# Patient Record
Sex: Female | Born: 1970 | Race: White | Hispanic: No | Marital: Married | Smoking: Never smoker
Health system: Southern US, Community
[De-identification: ages and names within clinical notes are randomized; demographics above are authoritative.]

## PROBLEM LIST (undated history)

## (undated) DIAGNOSIS — R7303 Prediabetes: Secondary | ICD-10-CM

## (undated) DIAGNOSIS — G43909 Migraine, unspecified, not intractable, without status migrainosus: Secondary | ICD-10-CM

## (undated) HISTORY — PX: PARTIAL HYSTERECTOMY: SHX80

## (undated) HISTORY — PX: UMBILICAL HERNIA REPAIR: SHX196

## (undated) HISTORY — PX: BONE EXOSTOSIS EXCISION: SHX1249

## (undated) HISTORY — DX: Prediabetes: R73.03

## (undated) HISTORY — PX: TERATOMA EXCISION: SHX2491

## (undated) HISTORY — PX: TOTAL VAGINAL HYSTERECTOMY: SHX2548

## (undated) HISTORY — PX: OTHER SURGICAL HISTORY: SHX169

## (undated) HISTORY — PX: TOTAL HIP ARTHROPLASTY: SHX124

---

## 2011-11-08 LAB — LIPID PANEL
Cholesterol: 168 mg/dL (ref 0–200)
HDL: 39 mg/dL (ref 35–70)
LDL Cholesterol: 109 mg/dL
Triglycerides: 99 mg/dL (ref 40–160)

## 2011-11-08 LAB — BASIC METABOLIC PANEL
BUN: 16 mg/dL (ref 4–21)
CREATININE: 0.8 mg/dL (ref 0.5–1.1)
GLUCOSE: 90 mg/dL
Potassium: 4 mmol/L (ref 3.4–5.3)

## 2011-11-08 LAB — CBC AND DIFFERENTIAL
Hemoglobin: 12.6 g/dL (ref 12.0–16.0)
PLATELETS: 228 10*3/uL (ref 150–399)
WBC: 4.9 10^3/mL

## 2011-11-08 LAB — HEPATIC FUNCTION PANEL
ALT: 11 U/L (ref 7–35)
AST: 15 U/L (ref 13–35)

## 2011-11-10 LAB — URINE, BLOOD

## 2011-11-10 LAB — HEMOGLOBIN A1C: HEMOGLOBIN A1C: 6 % (ref 4.0–6.0)

## 2011-11-10 LAB — TSH: TSH: 1.02 u[IU]/mL (ref 0.41–5.90)

## 2012-08-02 LAB — VITAMIN D 25 HYDROXY (VIT D DEFICIENCY, FRACTURES): Vit D, 25-Hydroxy: 26.2

## 2013-04-11 ENCOUNTER — Encounter: Payer: Self-pay | Admitting: Family Medicine

## 2013-04-11 ENCOUNTER — Ambulatory Visit (INDEPENDENT_AMBULATORY_CARE_PROVIDER_SITE_OTHER): Payer: 59 | Admitting: Family Medicine

## 2013-04-11 VITALS — BP 136/92 | HR 122 | Ht 63.0 in | Wt 169.0 lb

## 2013-04-11 DIAGNOSIS — M719 Bursopathy, unspecified: Secondary | ICD-10-CM

## 2013-04-11 DIAGNOSIS — R7303 Prediabetes: Secondary | ICD-10-CM | POA: Insufficient documentation

## 2013-04-11 DIAGNOSIS — Z96649 Presence of unspecified artificial hip joint: Secondary | ICD-10-CM

## 2013-04-11 DIAGNOSIS — Z96641 Presence of right artificial hip joint: Secondary | ICD-10-CM | POA: Insufficient documentation

## 2013-04-11 DIAGNOSIS — L941 Linear scleroderma: Secondary | ICD-10-CM | POA: Insufficient documentation

## 2013-04-11 DIAGNOSIS — M7582 Other shoulder lesions, left shoulder: Secondary | ICD-10-CM | POA: Insufficient documentation

## 2013-04-11 DIAGNOSIS — Z13220 Encounter for screening for lipoid disorders: Secondary | ICD-10-CM

## 2013-04-11 DIAGNOSIS — R7309 Other abnormal glucose: Secondary | ICD-10-CM

## 2013-04-11 DIAGNOSIS — K900 Celiac disease: Secondary | ICD-10-CM

## 2013-04-11 DIAGNOSIS — M67919 Unspecified disorder of synovium and tendon, unspecified shoulder: Secondary | ICD-10-CM

## 2013-04-11 DIAGNOSIS — K9041 Non-celiac gluten sensitivity: Secondary | ICD-10-CM | POA: Insufficient documentation

## 2013-04-11 NOTE — Progress Notes (Signed)
CC: Wendy Anderson is a 43 y.o. 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  She had her rheumatologic workup that included a positive ANA however all other labs argued against any other limiting disease causing her osteoarthritis. She reports she has mild to moderate pain in most joints of her body on a daily basis however this was 75% resolved ever since she stopped eating gluten. Symptoms will worsen if she has gluten unknowingly. Symptoms are improved with occasional nonsteroidal anti-inflammatory use. She denies any swelling redness or warmth of any of her joints in the recent past. Pain is not interfering with quality of life  She reports a history of prediabetes most recent A1c 6.0 approximately 12 months ago. She reports insulin-dependent gestational diabetes in one of her pregnancies about 20 years ago.  She's never had an A1c of 6.5 or above that she knows of.  Complains of left shoulder pain that has been present for 4 months which is different in character compared to her chronic diffuse joint pain. Is described as sharp, nonradiating, localized in the anterior shoulder worse with abduction of the humerus. Improves with Tylenol no benefit with etodolac. Symptoms are mild in severity occasionally awake her sleep if she rolls over on the shoulder. She denies weakness or motor or sensory disturbances in the left upper extremity nor recent or remote trauma.  Review Of Systems Outlined In HPI  Past Medical History  Diagnosis Date  . Prediabetes      Family History  Problem Relation Age of Onset  . Lung cancer Mother     deceased at age 33  . Heart attack Maternal Grandfather   . Diabetes Paternal Grandfather   . Hyperlipidemia Paternal Grandfather   . Neurologic Disorder Father     no dx as of yet;     History   Substance Use Topics  . Smoking status: Never Smoker   . Smokeless tobacco: Not on file  . Alcohol Use: Yes     Comment: socially     Objective: Filed Vitals:   04/11/13 1052  BP: 136/92  Pulse: 122    General: Alert and Oriented, No Acute Distress HEENT: Pupils equal, round, reactive to light. Conjunctivae clear. Moist mucous membranes pharynx unremarkable Lungs: Clear portable work of breathing Cardiac: Regular rate and rhythm.  Extremities: No peripheral edema.  Strong peripheral pulses.  Left upper extremities exam shows positive Hawkins, positive empty can, negative crossarm no pain a.c. joint to palpation, negative nears full range of motion strength throughout the left upper extremity Mental Status: No depression, anxiety, nor agitation. Skin: Warm and dry.  Assessment & Plan: Elli was seen today for establish care.  Diagnoses and associated orders for this visit:  Status post right hip replacement 2012 [facility]  Prediabetes - BASIC METABOLIC PANEL WITH GFR  Lipid screening - Lipid panel  Tendinitis of left rotator cuff  Gluten intolerance    Prediabetes: Checking A1c and fasting blood sugar Gluten intolerance: Low suspicion of celiac disease however if she's getting benefit from gluten restriction from a pain standpoint I've encouraged her to keep up with this intervention. We also discussed exercise and additional dietary interventions to help with weight loss and conditioning Left rotator cuff tendinitis I've encouraged her to start with a home rehabilitation plan on a daily basis for the  next 3 weeks handout was provided return for consideration of steroid injection if not improved by late next month Due for routine dyslipidemia screening Right hip replacement: She'll continue to see [facility] on an annual basis 45 minutes spent face-to-face during visit today of which at least 50% was counseling or coordinating care regarding: 1. Status post  right hip replacement 2012 [facility]   2. Prediabetes   3. Lipid screening   4. Tendinitis of left rotator cuff   5. Gluten intolerance      Return in about 4 weeks (around 05/09/2013) for CPE.

## 2013-04-12 ENCOUNTER — Encounter: Payer: Self-pay | Admitting: Family Medicine

## 2013-04-12 DIAGNOSIS — K649 Unspecified hemorrhoids: Secondary | ICD-10-CM | POA: Insufficient documentation

## 2013-04-12 DIAGNOSIS — M5030 Other cervical disc degeneration, unspecified cervical region: Secondary | ICD-10-CM | POA: Insufficient documentation

## 2013-04-12 DIAGNOSIS — R319 Hematuria, unspecified: Secondary | ICD-10-CM | POA: Insufficient documentation

## 2013-04-12 DIAGNOSIS — E559 Vitamin D deficiency, unspecified: Secondary | ICD-10-CM | POA: Insufficient documentation

## 2013-04-12 DIAGNOSIS — G4700 Insomnia, unspecified: Secondary | ICD-10-CM | POA: Insufficient documentation

## 2013-04-13 ENCOUNTER — Encounter: Payer: Self-pay | Admitting: *Deleted

## 2013-04-15 ENCOUNTER — Encounter: Payer: Self-pay | Admitting: *Deleted

## 2013-06-14 LAB — BASIC METABOLIC PANEL WITH GFR
BUN: 14 mg/dL (ref 6–23)
CALCIUM: 9.4 mg/dL (ref 8.4–10.5)
CO2: 26 mEq/L (ref 19–32)
Chloride: 103 mEq/L (ref 96–112)
Creat: 0.74 mg/dL (ref 0.50–1.10)
GFR, Est Non African American: >89 mL/min
Glucose, Bld: 95 mg/dL (ref 70–99)
Potassium: 4.9 mEq/L (ref 3.5–5.3)
Sodium: 138 mEq/L (ref 135–145)

## 2013-06-14 LAB — LIPID PANEL
CHOL/HDL RATIO: 3.5 ratio
CHOLESTEROL: 190 mg/dL (ref 0–200)
HDL: 54 mg/dL (ref >39)
LDL Cholesterol: 113 mg/dL — ABNORMAL HIGH (ref 0–99)
TRIGLYCERIDES: 116 mg/dL (ref <150)
VLDL: 23 mg/dL (ref 0–40)

## 2013-06-14 LAB — HEMOGLOBIN A1C
Hgb A1c MFr Bld: 6 % — ABNORMAL HIGH (ref <5.7)
Mean Plasma Glucose: 126 mg/dL — ABNORMAL HIGH (ref <117)

## 2013-06-20 ENCOUNTER — Ambulatory Visit (INDEPENDENT_AMBULATORY_CARE_PROVIDER_SITE_OTHER): Payer: 59

## 2013-06-20 ENCOUNTER — Other Ambulatory Visit: Payer: Self-pay | Admitting: Family Medicine

## 2013-06-20 ENCOUNTER — Ambulatory Visit: Payer: 59

## 2013-06-20 ENCOUNTER — Encounter: Payer: Self-pay | Admitting: Family Medicine

## 2013-06-20 ENCOUNTER — Ambulatory Visit (INDEPENDENT_AMBULATORY_CARE_PROVIDER_SITE_OTHER): Payer: 59 | Admitting: Family Medicine

## 2013-06-20 ENCOUNTER — Telehealth: Payer: Self-pay | Admitting: Family Medicine

## 2013-06-20 VITALS — BP 132/84 | HR 88 | Wt 170.0 lb

## 2013-06-20 DIAGNOSIS — M79609 Pain in unspecified limb: Secondary | ICD-10-CM

## 2013-06-20 DIAGNOSIS — M79672 Pain in left foot: Secondary | ICD-10-CM

## 2013-06-20 MED ORDER — MELOXICAM 15 MG PO TABS: 15.0000 mg | ORAL_TABLET | Freq: Every day | ORAL | Status: DC

## 2013-06-20 NOTE — Progress Notes (Signed)
CC: Wendy Anderson is a 43 y.o. female is here for left foot pain   Subjective: HPI:  Right heel pain that has been present for the past 4 days came on gradually and has not been getting better or worse since onset. Seems to be worse late in the day. Described as a sharpness moderate severity nonradiating localized on the bottom of the heel. Has not been accompanied by any swelling redness or bruising. No other skin changes. Pain is improved when walking on the toes worse when walking on the heel. No improvement with etodolac no other interventions as of yet.  Works out Tuesdays and Thursdays with intervals and gym work.  Review Of Systems Outlined In HPI  Past Medical History  Diagnosis Date  . Prediabetes     Past Surgical History  Procedure Laterality Date  . Partial hysterectomy    . Bladder tack    . Umbilical hernia repair    . Teratoma excision      bilateral  . Bone exostosis excision      removal from left femur(benign)  . Total hip arthroplasty      right hip  . Removal of bone spurs      form 4th and 5th toes   Family History  Problem Relation Age of Onset  . Lung cancer Mother     deceased at age 60  . Heart attack Maternal Grandfather   . Diabetes Paternal Grandfather   . Hyperlipidemia Paternal Grandfather   . Neurologic Disorder Father     no dx as of yet;    History   Social History  . Marital Status: Married    Spouse Name: N/A    Number of Children: N/A  . Years of Education: N/A   Occupational History  . Not on file.   Social History Main Topics  . Smoking status: Never Smoker   . Smokeless tobacco: Not on file  . Alcohol Use: Yes     Comment: socially  . Drug Use: No  . Sexual Activity: Yes    Partners: Male   Other Topics Concern  . Not on file   Social History Narrative  . No narrative on file     Objective: BP 132/84  Pulse 88  Wt 170 lb (77.111 kg)  General: Alert and Oriented, No Acute Distress HEENT: Pupils  equal, round, reactive to light. Conjunctivae clear.  Moist mucous membranes Extremities: No peripheral edema.  Strong peripheral pulses. No pain over left medial or lateral malleoli, base of fifth metatarsal, navicular, nor in the toe box. Full range of motion strength in the left ankle and foot.  Pain is reproduced with interior surface palpation of calcaneus, no posterior calcaneal pain. Mental Status: No depression, anxiety, nor agitation. Skin: Warm and dry. No overlying skin changes  Assessment & Plan: Wendy "[facility]" was seen today for left foot pain.  Diagnoses and associated orders for this visit:  Pain of left heel - DG Foot Complete Left; Future - meloxicam (MOBIC) 15 MG tablet; Take 1 tablet (15 mg total) by mouth daily.    Left heel pain: Obtain x-ray to rule out stress fracture suspect this is due to plantar fasciitis if no abnormalities on x-ray.  If x-ray is normal we'll focus on meloxicam, and home rehabilitation routine she was given a handout and a resistance band  Return if symptoms worsen or fail to improve.

## 2013-06-20 NOTE — Telephone Encounter (Signed)
Seth Bake, Can you check downstairs in radiology to see if they're having any issues with Ms. Surges's foot xray, I'm not seeing any images in our system yet... Also will you let her know why there's a delay in her results?

## 2013-06-22 ENCOUNTER — Encounter: Payer: Self-pay | Admitting: Family Medicine

## 2013-06-22 ENCOUNTER — Ambulatory Visit (INDEPENDENT_AMBULATORY_CARE_PROVIDER_SITE_OTHER): Payer: 59 | Admitting: Family Medicine

## 2013-06-22 VITALS — BP 127/84 | HR 95 | Ht 63.0 in | Wt 166.0 lb

## 2013-06-22 DIAGNOSIS — Z0000 Encounter for general adult medical examination without abnormal findings: Secondary | ICD-10-CM

## 2013-06-22 NOTE — Patient Instructions (Signed)
Dr. Devaunte Gasparini's General Advice Following Your Complete Physical Exam  The Benefits of Regular Exercise: Unless you suffer from an uncontrolled cardiovascular condition, studies strongly suggest that regular exercise and physical activity will add to both the quality and length of your life.  The World Health Organization recommends 150 minutes of moderate intensity aerobic activity every week.  This is best split over 3-4 days a week, and can be as simple as a brisk walk for just over 35 minutes "most days of the week".  This type of exercise has been shown to lower LDL-Cholesterol, lower average blood sugars, lower blood pressure, lower cardiovascular disease risk, improve memory, and increase one's overall sense of wellbeing.  The addition of anaerobic (or "strength training") exercises offers additional benefits including but not limited to increased metabolism, prevention of osteoporosis, and improved overall cholesterol levels.  How Can I Strive For A Low-Fat Diet?: Current guidelines recommend that 25-35 percent of your daily energy (food) intake should come from fats.  One might ask how can this be achieved without having to dissect each meal on a daily basis?  Switch to skim or 1% milk instead of whole milk.  Focus on lean meats such as ground turkey, fresh fish, baked chicken, and lean cuts of beef as your source of dietary protein.  Limit saturated fat consumption to less than 10% of your daily caloric intake.  Limit trans fatty acid consumption primarily by limiting synthetic trans fats such as partially hydrogenated oils (Ex: fried fast foods).  Substitute olive or vegetable oil for solid fats where possible.  Moderation of Salt Intake: Provided you don't carry a diagnosis of congestive heart failure nor renal failure, I recommend a daily allowance of no more than 2300 mg of salt (sodium).  Keeping under this daily goal is associated with a decreased risk of cardiovascular events, creeping  above it can lead to elevated blood pressures and increases your risk of cardiovascular events.  Milligrams (mg) of salt is listed on all nutrition labels, and your daily intake can add up faster than you think.  Most canned and frozen dinners can pack in over half your daily salt allowance in one meal.    Lifestyle Health Risks: Certain lifestyle choices carry specific health risks.  As you may already know, tobacco use has been associated with increasing one's risk of cardiovascular disease, pulmonary disease, numerous cancers, among many other issues.  What you may not know is that there are medications and nicotine replacement strategies that can more than double your chances of successfully quitting.  I would be thrilled to help manage your quitting strategy if you currently use tobacco products.  When it comes to alcohol use, I've yet to find an "ideal" daily allowance.  Provided an individual does not have a medical condition that is exacerbated by alcohol consumption, general guidelines determine "safe drinking" as no more than two standard drinks for a man or no more than one standard drink for a female per day.  However, much debate still exists on whether any amount of alcohol consumption is technically "safe".  My general advice, keep alcohol consumption to a minimum for general health promotion.  If you or others believe that alcohol, tobacco, or recreational drug use is interfering with your life, I would be happy to provide confidential counseling regarding treatment options.  General "Over The Counter" Nutrition Advice: Postmenopausal women should aim for a daily calcium intake of 1200 mg, however a significant portion of this might already be   provided by diets including milk, yogurt, cheese, and other dairy products.  Vitamin D has been shown to help preserve bone density, prevent fatigue, and has even been shown to help reduce falls in the elderly.  Ensuring a daily intake of 800 Units of  Vitamin D is a good place to start to enjoy the above benefits, we can easily check your Vitamin D level to see if you'd potentially benefit from supplementation beyond 800 Units a day.  Folic Acid intake should be of particular concern to women of childbearing age.  Daily consumption of 400-800 mcg of Folic Acid is recommended to minimize the chance of spinal cord defects in a fetus should pregnancy occur.    For many adults, accidents still remain one of the most common culprits when it comes to cause of death.  Some of the simplest but most effective preventitive habits you can adopt include regular seatbelt use, proper helmet use, securing firearms, and regularly testing your smoke and carbon monoxide detectors.  Shaunika Italiano B. Messi Twedt DO [facility] [address], [address] [place], [place] [address] Phone: [phone]  

## 2013-06-22 NOTE — Progress Notes (Signed)
CC: Wendy Anderson is a 43 y.o. female is here for CPE   Subjective: HPI:  Colonoscopy: Repeat 2021, unremarkable 2011 other than diverticulosis during workup of rectal bleeding Papsmear: January 2015 normal, no history of abnormals Mammogram: Annually since 70 needed [place] at original visit however normal since using screening mammography  Influenza Vaccine: Received this year Pneumovax: No current indication Td/Tdap: Tdap 2011 Zoster: (Start 43 yo)  Exercises most days of the week, tries to watch what she eats to a low carb diet and low-fat diet. Rare alcohol use no tobacco or recreational drug use.  IReview of Systems - General ROS: negative for - chills, fever, night sweats, weight gain or weight loss Ophthalmic ROS: negative for - decreased vision Psychological ROS: negative for - anxiety or depression ENT ROS: negative for - hearing change, nasal congestion, tinnitus or allergies Hematological and Lymphatic ROS: negative for - bleeding problems, bruising or swollen lymph nodes Breast ROS: negative Respiratory ROS: no cough, shortness of breath, or wheezing Cardiovascular ROS: no chest pain or dyspnea on exertion Gastrointestinal ROS: no abdominal pain, change in bowel habits, or black or bloody stools Genito-Urinary ROS: negative for - genital discharge, genital ulcers, incontinence or abnormal bleeding from genitals Musculoskeletal ROS: negative for - joint pain or muscle pain Neurological ROS: negative for - headaches or memory loss Dermatological ROS: negative for lumps, mole changes, rash and skin lesion changes  Past Medical History  Diagnosis Date  . Prediabetes     Past Surgical History  Procedure Laterality Date  . Partial hysterectomy    . Bladder tack    . Umbilical hernia repair    . Teratoma excision      bilateral  . Bone exostosis excision      removal from left femur(benign)  . Total hip arthroplasty      right hip  . Removal of bone spurs      form  4th and 5th toes   Family History  Problem Relation Age of Onset  . Lung cancer Mother     deceased at age 13  . Heart attack Maternal Grandfather   . Diabetes Paternal Grandfather   . Hyperlipidemia Paternal Grandfather   . Neurologic Disorder Father     no dx as of yet;    History   Social History  . Marital Status: Married    Spouse Name: N/A    Number of Children: N/A  . Years of Education: N/A   Occupational History  . Not on file.   Social History Main Topics  . Smoking status: Never Smoker   . Smokeless tobacco: Not on file  . Alcohol Use: Yes     Comment: socially  . Drug Use: No  . Sexual Activity: Yes    Partners: Male   Other Topics Concern  . Not on file   Social History Narrative  . No narrative on file     Objective: BP 127/84  Pulse 95  Ht 5\' 3"  (1.6 m)  Wt 166 lb (75.297 kg)  BMI 29.41 kg/m2  General: No Acute Distress HEENT: Atraumatic, normocephalic, conjunctivae normal without scleral icterus.  No nasal discharge, hearing grossly intact, TMs with good landmarks bilaterally with no middle ear abnormalities, posterior pharynx clear without oral lesions. Neck: Supple, trachea midline, no cervical nor supraclavicular adenopathy. Pulmonary: Clear to auscultation bilaterally without wheezing, rhonchi, nor rales. Cardiac: Regular rate and rhythm.  No murmurs, rubs, nor gallops. No peripheral edema.  2+ peripheral pulses bilaterally. Abdomen: Bowel  sounds normal.  No masses.  Non-tender without rebound.  Negative Murphy's sign. GU: Deferred to annual GYN exam that occurred in January of this year  MSK: Grossly intact, no signs of weakness.  Full strength throughout upper and lower extremities.  Full ROM in upper and lower extremities.  No midline spinal tenderness. Neuro: Gait unremarkable, CN II-XII grossly intact.  C5-C6 Reflex 2/4 Bilaterally, L4 Reflex 2/4 Bilaterally.  Cerebellar function intact. Skin: No rashes. Psych: Alert and oriented to  person/place/time.  Thought process normal. No anxiety/depression.   Assessment & Plan: Wendy Anderson was seen today for cpe.  Diagnoses and associated orders for this visit:  Physical exam    Healthy lifestyle interventions including but not limited to regular exercise, a healthy low fat diet, moderation of salt intake, the dangers of tobacco/alcohol/recreational drug use, nutrition supplementation, and accident avoidance were discussed with the patient and a handout was provided for future reference.  Return in about 4 months (around 10/22/2013) for lipid and A1c labs.

## 2013-07-26 ENCOUNTER — Other Ambulatory Visit: Payer: Self-pay | Admitting: Family Medicine

## 2013-11-11 ENCOUNTER — Encounter: Payer: Self-pay | Admitting: Family Medicine

## 2013-11-11 ENCOUNTER — Ambulatory Visit (INDEPENDENT_AMBULATORY_CARE_PROVIDER_SITE_OTHER): Payer: 59 | Admitting: Family Medicine

## 2013-11-11 VITALS — BP 131/87 | HR 82 | Ht 63.0 in | Wt 159.0 lb

## 2013-11-11 DIAGNOSIS — M25511 Pain in right shoulder: Secondary | ICD-10-CM

## 2013-11-11 DIAGNOSIS — M25519 Pain in unspecified shoulder: Secondary | ICD-10-CM

## 2013-11-11 DIAGNOSIS — R7303 Prediabetes: Secondary | ICD-10-CM

## 2013-11-11 DIAGNOSIS — E785 Hyperlipidemia, unspecified: Secondary | ICD-10-CM | POA: Insufficient documentation

## 2013-11-11 DIAGNOSIS — R7309 Other abnormal glucose: Secondary | ICD-10-CM

## 2013-11-11 NOTE — Progress Notes (Signed)
CC: Wendy Anderson is a 43 y.o. female is here for Shoulder Pain   Subjective: HPI:  Followup prediabetes: Over the past 6 months she's been addressed improvement with decreasing junk food, and eliminate fast food, decreasing sugar intake, and increasing physical activity. She's working out 3-4 times a week now. She's lost about 7 pounds intentionally. She denies polyuria polyphasia or polydipsia.  Followup hyperlipidemia: Interventions above, denies chest pain limb claudication nor any abdominal pain.    Complains of right shoulder pain has been present for the past 2-3 weeks. Described as a discomfort, moderate in severity, with occasional catching sensation is localized on the lateral biceps region. Symptoms are worse with any resisted abduction of the arm or overhead activities., Symptoms are also worse when doing pushups. Symptoms improved with avoidance of any resistance and with rest.  She denies swelling redness or warmth of the shoulder or any other joint. Denies any motor or sensory disturbances in the right upper extremity nor neck pain or headaches.  She tells me that her father was recently diagnosed with primary progressive aphasia and she was noted to anything she can do to help avoid developing this herself.   Review Of Systems Outlined In HPI  Past Medical History  Diagnosis Date  . Prediabetes     Past Surgical History  Procedure Laterality Date  . Partial hysterectomy    . Bladder tack    . Umbilical hernia repair    . Teratoma excision      bilateral  . Bone exostosis excision      removal from left femur(benign)  . Total hip arthroplasty      right hip  . Removal of bone spurs      form 4th and 5th toes   Family History  Problem Relation Age of Onset  . Lung cancer Mother     deceased at age 32  . Heart attack Maternal Grandfather   . Diabetes Paternal Grandfather   . Hyperlipidemia Paternal Grandfather   . Neurologic Disorder Father     Primary  Progressive Aphasia    History   Social History  . Marital Status: Married    Spouse Name: N/A    Number of Children: N/A  . Years of Education: N/A   Occupational History  . Not on file.   Social History Main Topics  . Smoking status: Never Smoker   . Smokeless tobacco: Not on file  . Alcohol Use: Yes     Comment: socially  . Drug Use: No  . Sexual Activity: Yes    Partners: Male   Other Topics Concern  . Not on file   Social History Narrative  . No narrative on file     Objective: BP 131/87  Pulse 82  Ht 5\' 3"  (1.6 m)  Wt 159 lb (72.122 kg)  BMI 28.17 kg/m2  General: Alert and Oriented, No Acute Distress HEENT: Pupils equal, round, reactive to light. Conjunctivae clear.  Moist mucous membranes pharynx unremarkable Lungs: Clear to auscultation bilaterally, no wheezing/ronchi/rales.  Comfortable work of breathing. Good air movement. Cardiac: Regular rate and rhythm. Normal S1/S2.  No murmurs, rubs, nor gallops.   Right shoulder exam reveals full range of motion and strength in all planes of motion and with individual rotator cuff testing. No overlying redness warmth or swelling.  Neer's test negative.  Hawkins test negative. Empty can negative. Crossarm test positive. O'Brien's test positive. Apprehension test negative. Speed's test negative. Extremities: No peripheral edema.  Strong peripheral  pulses.  Mental Status: No depression, anxiety, nor agitation. Skin: Warm and dry.  Assessment & Plan: Shasha was seen today for shoulder pain.  Diagnoses and associated orders for this visit:  Prediabetes - HgB A1c  Right shoulder pain  Hyperlipidemia - Lipid panel    Advised I don't know of anything that'll help prevent developing primary progressive aphasia I will ask my neurology colleagues. I believe that this is wholly genetic and not environmentally influenced. Right shoulder pain: Suspect labral tear, I've given her some home exercises/rehabilitation to do  for the next 4 weeks and encouraged her to avoid using any weights with a right upper extremity for the next 2 weeks. If no better in 4 weeks we'll refer to Dr. Darene Lamer. In sports medicine Prediabetes: Clinically controlled however due for A1c Hyperlipidemia: Clinically controlled due for lipid panel  40 minutes spent face-to-face during visit today of which at least 50% was counseling or coordinating care regarding: 1. Prediabetes   2. Right shoulder pain   3. Hyperlipidemia      Return in about 3 months (around 02/11/2014).

## 2013-11-12 LAB — LIPID PANEL
Cholesterol: 191 mg/dL (ref 0–200)
HDL: 55 mg/dL (ref >39)
LDL CALC: 114 mg/dL — AB (ref 0–99)
TRIGLYCERIDES: 108 mg/dL (ref <150)
Total CHOL/HDL Ratio: 3.5 Ratio
VLDL: 22 mg/dL (ref 0–40)

## 2013-11-12 LAB — HEMOGLOBIN A1C
Hgb A1c MFr Bld: 5.8 % — ABNORMAL HIGH (ref <5.7)
Mean Plasma Glucose: 120 mg/dL — ABNORMAL HIGH (ref <117)

## 2013-12-22 ENCOUNTER — Encounter: Payer: Self-pay | Admitting: Family Medicine

## 2014-01-02 ENCOUNTER — Encounter: Payer: Self-pay | Admitting: Family Medicine

## 2014-12-07 ENCOUNTER — Ambulatory Visit (INDEPENDENT_AMBULATORY_CARE_PROVIDER_SITE_OTHER): Payer: 59 | Admitting: Family Medicine

## 2014-12-07 ENCOUNTER — Encounter: Payer: Self-pay | Admitting: Family Medicine

## 2014-12-07 ENCOUNTER — Telehealth: Payer: Self-pay | Admitting: *Deleted

## 2014-12-07 VITALS — BP 122/93 | HR 83 | Ht 63.0 in | Wt 171.0 lb

## 2014-12-07 DIAGNOSIS — G4700 Insomnia, unspecified: Secondary | ICD-10-CM

## 2014-12-07 DIAGNOSIS — I10 Essential (primary) hypertension: Secondary | ICD-10-CM

## 2014-12-07 DIAGNOSIS — Z0000 Encounter for general adult medical examination without abnormal findings: Secondary | ICD-10-CM

## 2014-12-07 DIAGNOSIS — J3489 Other specified disorders of nose and nasal sinuses: Secondary | ICD-10-CM | POA: Diagnosis not present

## 2014-12-07 DIAGNOSIS — Z1231 Encounter for screening mammogram for malignant neoplasm of breast: Secondary | ICD-10-CM | POA: Diagnosis not present

## 2014-12-07 DIAGNOSIS — R7309 Other abnormal glucose: Secondary | ICD-10-CM

## 2014-12-07 DIAGNOSIS — R7303 Prediabetes: Secondary | ICD-10-CM

## 2014-12-07 LAB — COMPLETE METABOLIC PANEL WITH GFR
ALT: 15 U/L (ref 6–29)
AST: 16 U/L (ref 10–35)
Albumin: 4.1 g/dL (ref 3.6–5.1)
Alkaline Phosphatase: 49 U/L (ref 33–115)
BUN: 17 mg/dL (ref 7–25)
CALCIUM: 9.4 mg/dL (ref 8.6–10.2)
CHLORIDE: 102 mmol/L (ref 98–110)
CO2: 26 mmol/L (ref 20–31)
CREATININE: 0.69 mg/dL (ref 0.50–1.10)
GFR, Est Non African American: >89 mL/min (ref >=60)
Glucose, Bld: 95 mg/dL (ref 65–99)
POTASSIUM: 4.7 mmol/L (ref 3.5–5.3)
Sodium: 140 mmol/L (ref 135–146)
Total Bilirubin: 0.3 mg/dL (ref 0.2–1.2)
Total Protein: 6.9 g/dL (ref 6.1–8.1)

## 2014-12-07 LAB — LIPID PANEL
CHOL/HDL RATIO: 5.1 ratio — AB (ref <=5.0)
CHOLESTEROL: 244 mg/dL — AB (ref 125–200)
HDL: 48 mg/dL (ref >=46)
LDL Cholesterol: 159 mg/dL — ABNORMAL HIGH (ref <130)
TRIGLYCERIDES: 186 mg/dL — AB (ref <150)
VLDL: 37 mg/dL — ABNORMAL HIGH (ref <30)

## 2014-12-07 MED ORDER — TRAZODONE HCL 50 MG PO TABS: 25.0000 mg | ORAL_TABLET | Freq: Every evening | ORAL | Status: DC | PRN

## 2014-12-07 MED ORDER — MUPIROCIN CALCIUM 2 % NA OINT: 1.0000 "application " | TOPICAL_OINTMENT | Freq: Two times a day (BID) | NASAL | Status: DC

## 2014-12-07 NOTE — Telephone Encounter (Signed)
Labs

## 2014-12-07 NOTE — Telephone Encounter (Signed)
labs

## 2014-12-07 NOTE — Progress Notes (Signed)
CC: Wendy Anderson is a 44 y.o. female is here for Annual Exam   Subjective: HPI:  Colonoscopy: no current indication Papsmear: normal last year per patient, repeat in 2-4 years Mammogram: overdue, orders have been placed  Influenza Vaccine: declined Pneumovax: no current indication Td/Tdap: UTD from 2011 Zoster: (Start 44 yo)  Requesting complete physical exam with the below complaints  Difficulty staying asleep for hours. Many night she'll wake up after 4 hours of sleep and then have difficulty finding her sleep for another hour to an hour and a half. Some nights she wakes up feeling poorly rested. She's tried Lunesta in the past however she's afraid of taking something that will impair her ability to wake up of her children or her husband needs help. No benefit from melatonin.   Review of Systems - General ROS: negative for - chills, fever, night sweats, weight gain or weight loss Ophthalmic ROS: negative for - decreased vision Psychological ROS: negative for - anxiety or depression ENT ROS: negative for - hearing change, nasal congestion, tinnitus or allergies Hematological and Lymphatic ROS: negative for - bleeding problems, bruising or swollen lymph nodes Breast ROS: negative Respiratory ROS: no cough, shortness of breath, or wheezing Cardiovascular ROS: no chest pain or dyspnea on exertion Gastrointestinal ROS: no abdominal pain, change in bowel habits, or black or bloody stools Genito-Urinary ROS: negative for - genital discharge, genital ulcers, incontinence or abnormal bleeding from genitals Musculoskeletal ROS: negative for - joint pain or muscle pain Neurological ROS: negative for - headaches or memory loss Dermatological ROS: negative for lumps, mole changes, rash and skin lesion changes  Past Medical History  Diagnosis Date  . Prediabetes     Past Surgical History  Procedure Laterality Date  . Partial hysterectomy    . Bladder tack    . Umbilical hernia repair     . Teratoma excision      bilateral  . Bone exostosis excision      removal from left femur(benign)  . Total hip arthroplasty      right hip  . Removal of bone spurs      form 4th and 5th toes   Family History  Problem Relation Age of Onset  . Lung cancer Mother     deceased at age 74  . Heart attack Maternal Grandfather   . Diabetes Paternal Grandfather   . Hyperlipidemia Paternal Grandfather   . Neurologic Disorder Father     Primary Progressive Aphasia    Social History   Social History  . Marital Status: Married    Spouse Name: N/A  . Number of Children: N/A  . Years of Education: N/A   Occupational History  . Not on file.   Social History Main Topics  . Smoking status: Never Smoker   . Smokeless tobacco: Not on file  . Alcohol Use: Yes     Comment: socially  . Drug Use: No  . Sexual Activity:    Partners: Male   Other Topics Concern  . Not on file   Social History Narrative     Objective: BP 122/93 mmHg  Pulse 83  Ht 5\' 3"  (1.6 m)  Wt 171 lb (77.565 kg)  BMI 30.30 kg/m2  General: No Acute Distress HEENT: Atraumatic, normocephalic, conjunctivae normal without scleral icterus.  No nasal discharge, trace ulceration at the anteriormost aspect of the left internal nostril, hearing grossly intact, TMs with good landmarks bilaterally with no middle ear abnormalities, posterior pharynx clear without oral lesions. Neck:  Supple, trachea midline, no cervical nor supraclavicular adenopathy. Pulmonary: Clear to auscultation bilaterally without wheezing, rhonchi, nor rales. Cardiac: Regular rate and rhythm.  No murmurs, rubs, nor gallops. No peripheral edema.  2+ peripheral pulses bilaterally. Abdomen: Bowel sounds normal.  No masses.  Non-tender without rebound.  Negative Murphy's sign. GU: Declined  MSK: Grossly intact, no signs of weakness.  Full strength throughout upper and lower extremities.  Full ROM in upper and lower extremities.  No midline spinal  tenderness. Neuro: Gait unremarkable, CN II-XII grossly intact.  C5-C6 Reflex 2/4 Bilaterally, L4 Reflex 2/4 Bilaterally.  Cerebellar function intact. Skin: No rashes. Psych: Alert and oriented to person/place/time.  Thought process normal. No anxiety/depression.   Assessment & Plan: Wendy Anderson was seen today for annual exam.  Diagnoses and all orders for this visit:  Insomnia -     traZODone (DESYREL) 50 MG tablet; Take 0.5-1 tablets (25-50 mg total) by mouth at bedtime as needed for sleep.  Annual physical exam -     MM DIGITAL SCREENING BILATERAL  Visit for screening mammogram -     MM DIGITAL SCREENING BILATERAL  Nasal lesion -     mupirocin nasal ointment (BACTROBAN NASAL) 2 %; Place 1 application into the nose 2 (two) times daily. Apply small dab to left nostril sore twice a day for two weeks.  Prediabetes  Essential hypertension   Insomnia: Start trazodone Nasal lesion: Start Bactroban if no better after 2 weeks and let me know so I can refer to ear nose and throat. Prediabetes: Joint decision to try farxiga blood work comes back uncontrolled.  Signs and symptoms requring emergent/urgent reevaluation were discussed with the patient.  Return if symptoms worsen or fail to improve.

## 2014-12-08 ENCOUNTER — Telehealth: Payer: Self-pay | Admitting: Family Medicine

## 2014-12-08 DIAGNOSIS — R739 Hyperglycemia, unspecified: Secondary | ICD-10-CM

## 2014-12-08 LAB — HEMOGLOBIN A1C
Hgb A1c MFr Bld: 6 % — ABNORMAL HIGH (ref <5.7)
Mean Plasma Glucose: 126 mg/dL — ABNORMAL HIGH (ref <117)

## 2014-12-08 MED ORDER — DAPAGLIFLOZIN PROPANEDIOL 5 MG PO TABS: 5.0000 mg | ORAL_TABLET | Freq: Every day | ORAL | Status: DC

## 2014-12-08 NOTE — Telephone Encounter (Signed)
Pt notified and card up front

## 2014-12-08 NOTE — Telephone Encounter (Signed)
Wendy Anderson, Rx for farxiga sent to [facility] in [place].  It's important for her to pick up a savings voucher so that this medication is free.  She'll need this voucher before she picks up the Rx, we have a bunch of them here if she stops up front. Plan on f/u in 2-3 months.

## 2014-12-11 ENCOUNTER — Telehealth: Payer: Self-pay | Admitting: Family Medicine

## 2014-12-11 NOTE — Telephone Encounter (Signed)
Seth Bake, Will you please let patient know that [facility] has told me that farxiga is on back order.  This is the only case that I've heard of this happening, would she like me to send a Rx to a different non-[facility] pharmacy?

## 2014-12-12 NOTE — Telephone Encounter (Signed)
Wendy Anderson, It is my understanding that this medication does not need a PA if she uses the savings voucher.  I'll put this in the PA box to see if someone like Jenny Reichmann can call Mertha Finders to see if he can help to see if the pharmacy is incorrectly processing the card.

## 2014-12-12 NOTE — Telephone Encounter (Signed)
Pt states she was told that the medication needed a PA by pharmacy not that it was on backorder. She states that someone here, I assume Jenny Reichmann who does the PA's, told her that it may take up to two weeks to hear back from insurance company. Pt would like to wait to see what the insurance says about the medication

## 2014-12-13 ENCOUNTER — Ambulatory Visit (INDEPENDENT_AMBULATORY_CARE_PROVIDER_SITE_OTHER): Payer: 59

## 2014-12-13 DIAGNOSIS — Z1231 Encounter for screening mammogram for malignant neoplasm of breast: Secondary | ICD-10-CM

## 2014-12-14 NOTE — Telephone Encounter (Signed)
Message left on vm 

## 2014-12-14 NOTE — Telephone Encounter (Signed)
Representative ([employer]) came and got Pt's information, will go to pharmacy regarding PA and discount card.

## 2014-12-15 ENCOUNTER — Other Ambulatory Visit: Payer: Self-pay | Admitting: Family Medicine

## 2014-12-15 DIAGNOSIS — R739 Hyperglycemia, unspecified: Secondary | ICD-10-CM

## 2014-12-15 MED ORDER — DAPAGLIFLOZIN PROPANEDIOL 5 MG PO TABS: 5.0000 mg | ORAL_TABLET | Freq: Every day | ORAL | Status: DC

## 2014-12-15 NOTE — Telephone Encounter (Signed)
Wilder Glade originally went to [facility], they could not process the discount card. Changed Rx to [facility] pharmacy for fill. Pt aware of change.

## 2014-12-18 ENCOUNTER — Telehealth: Payer: Self-pay | Admitting: *Deleted

## 2014-12-18 NOTE — Telephone Encounter (Signed)
Wendy Anderson, Can you let Mertha Finders know about this and let me know if he think's it's something wrong with the pharmacies or is it because I'm using this savings voucher for treatment of pre-diabetes.

## 2014-12-18 NOTE — Telephone Encounter (Signed)
Pt called and states drug rep told her to go to [facility] to get rx filled . Pharmacist at [facility] unable to process copay card. Pt states pharmacist at [facility] worked at [facility] and called to the pharmacy. He says whatever trick they used to do to process the card at that time they are no longer able to do. Pt is thinking she needs to try something else because it's looking like the copay card is not going to work

## 2014-12-19 NOTE — Telephone Encounter (Signed)
Left voicemail for drug Rep advising of problem with Pharmacy.

## 2014-12-21 ENCOUNTER — Other Ambulatory Visit: Payer: Self-pay | Admitting: *Deleted

## 2014-12-21 ENCOUNTER — Telehealth: Payer: Self-pay | Admitting: Family Medicine

## 2014-12-21 DIAGNOSIS — R739 Hyperglycemia, unspecified: Secondary | ICD-10-CM

## 2014-12-21 MED ORDER — DAPAGLIFLOZIN PROPANEDIOL 5 MG PO TABS: 5.0000 mg | ORAL_TABLET | Freq: Every day | ORAL | Status: DC

## 2014-12-21 NOTE — Telephone Encounter (Signed)
Patient came in to check on status of prior authorization for Farxiga after reading the notes I realized that Mertha Finders had gone to the pharmacies to avoid doing a prior authorization on the medication but for some reason it is not working I submitted a prior authorization through cover my meds to see if we can get an approval through her insurance and I am waiting on authorization. - CF

## 2014-12-25 NOTE — Telephone Encounter (Signed)
Patient called and stated that [facility] called and said they got her script to go through with discount card and she could pick up the medication. - CF

## 2015-03-12 ENCOUNTER — Ambulatory Visit (INDEPENDENT_AMBULATORY_CARE_PROVIDER_SITE_OTHER): Payer: 59 | Admitting: Family Medicine

## 2015-03-12 ENCOUNTER — Encounter: Payer: Self-pay | Admitting: Family Medicine

## 2015-03-12 ENCOUNTER — Other Ambulatory Visit: Payer: Self-pay | Admitting: Family Medicine

## 2015-03-12 VITALS — BP 123/86 | HR 86 | Wt 164.0 lb

## 2015-03-12 DIAGNOSIS — Z23 Encounter for immunization: Secondary | ICD-10-CM

## 2015-03-12 DIAGNOSIS — G4700 Insomnia, unspecified: Secondary | ICD-10-CM

## 2015-03-12 DIAGNOSIS — R7303 Prediabetes: Secondary | ICD-10-CM | POA: Diagnosis not present

## 2015-03-12 DIAGNOSIS — R0789 Other chest pain: Secondary | ICD-10-CM

## 2015-03-12 DIAGNOSIS — E559 Vitamin D deficiency, unspecified: Secondary | ICD-10-CM

## 2015-03-12 DIAGNOSIS — R739 Hyperglycemia, unspecified: Secondary | ICD-10-CM | POA: Diagnosis not present

## 2015-03-12 LAB — POCT GLYCOSYLATED HEMOGLOBIN (HGB A1C): HEMOGLOBIN A1C: 6

## 2015-03-12 MED ORDER — DAPAGLIFLOZIN PROPANEDIOL 5 MG PO TABS
5.0000 mg | ORAL_TABLET | Freq: Every day | ORAL | Status: DC
Start: 2015-03-12 — End: 2016-04-25

## 2015-03-12 MED ORDER — VITAMIN D (ERGOCALCIFEROL) 1.25 MG (50000 UNIT) PO CAPS: 50000.0000 [IU] | ORAL_CAPSULE | ORAL | Status: DC

## 2015-03-12 NOTE — Progress Notes (Signed)
CC: Wendy Anderson is a 44 y.o. female is here for Medication Management   Subjective: HPI:   Follow-up prediabetes: She's been doing her best to try to limit carbohydrate intake. She denies any formal exercise routine. She's gotten a yeast infection once since starting on farxiga but no other side effects. She denies vision loss or poorly healing wounds.  Follow-up insomnia: She is requesting a refill on trazodone. It helps greatly with falling asleep. She denies any known side effects. She denies any urinary pain or anxiety issues contributing to sleep disturbance.  She is a history of vitamin D deficiency and has been taking a daily vitamin D supplement but is worried that she did not get much sunlight this summer and would like to know if she needs to get a larger supplement.  She complains of some chest discomfort that's hard for her to describe. There is no exertional component to it and it only lasts a matter of seconds. She is not sure what makes it better or worse. She cannot remember when it started. It only happens a couple times a month. She denies any new shortness of breath or wheezing. She notices that it happens when her reflux is worse.   Review Of Systems Outlined In HPI  Past Medical History  Diagnosis Date  . Prediabetes     Past Surgical History  Procedure Laterality Date  . Partial hysterectomy    . Bladder tack    . Umbilical hernia repair    . Teratoma excision      bilateral  . Bone exostosis excision      removal from left femur(benign)  . Total hip arthroplasty      right hip  . Removal of bone spurs      form 4th and 5th toes   Family History  Problem Relation Age of Onset  . Lung cancer Mother     deceased at age 31  . Heart attack Maternal Grandfather   . Diabetes Paternal Grandfather   . Hyperlipidemia Paternal Grandfather   . Neurologic Disorder Father     Primary Progressive Aphasia    Social History   Social History  . Marital Status:  Married    Spouse Name: N/A  . Number of Children: N/A  . Years of Education: N/A   Occupational History  . Not on file.   Social History Main Topics  . Smoking status: Never Smoker   . Smokeless tobacco: Not on file  . Alcohol Use: Yes     Comment: socially  . Drug Use: No  . Sexual Activity:    Partners: Male   Other Topics Concern  . Not on file   Social History Narrative     Objective: BP 123/86 mmHg  Pulse 86  Wt 164 lb (74.39 kg)  General: Alert and Oriented, No Acute Distress HEENT: Pupils equal, round, reactive to light. Conjunctivae clear.  Moist mucousmembranes Lungs: Clear to auscultation bilaterally, no wheezing/ronchi/rales.  Comfortable work of breathing. Good air movement. Cardiac: Regular rate and rhythm. Normal S1/S2.  No murmurs, rubs, nor gallops.   Extremities: No peripheral edema.  Strong peripheral pulses.  Mental Status: No depression, anxiety, nor agitation. Skin: Warm and dry.  Assessment & Plan: Wendy Anderson was seen today for medication management.  Diagnoses and all orders for this visit:  Prediabetes -     POCT HgB A1C  Insomnia  Vitamin D deficiency  Hyperglycemia -     dapagliflozin propanediol (FARXIGA) 5 MG TABS  tablet; Take 5 mg by mouth daily.  Chest discomfort  Encounter for immunization  Other orders -     Cancel: VITAMIN D 25 Hydroxy (Vit-D Deficiency, Fractures) -     Vitamin D, Ergocalciferol, (DRISDOL) 50000 UNITS CAPS capsule; Take 1 capsule (50,000 Units total) by mouth every 7 (seven) days. -     Flu Vaccine QUAD 36+ mos IM   Prediabetes: A1c is 6.0, controlled continue on farxiga Insomnia: Controlled continue with trazodone Chest discomfort: I've asked her to take Tums or ranitidine next time she feels the pain and note whether or not those away. Also asked her to keep a close eye on whether or not it's occurring only when she is exerting herself.Signs and symptoms requring emergent/urgent reevaluation were  discussed with the patient. Vitamin D deficiency: Due for vitamin D recheck.    Return in about 3 months (around 06/10/2015).

## 2015-03-13 ENCOUNTER — Encounter: Payer: Self-pay | Admitting: Family Medicine

## 2015-03-28 ENCOUNTER — Telehealth: Payer: Self-pay | Admitting: Family Medicine

## 2015-03-28 ENCOUNTER — Emergency Department (HOSPITAL_BASED_OUTPATIENT_CLINIC_OR_DEPARTMENT_OTHER)
Admission: EM | Admit: 2015-03-28 | Discharge: 2015-03-28 | Disposition: A | Discharge status: Home / Self Care | Payer: 59 | Attending: Emergency Medicine | Admitting: Emergency Medicine

## 2015-03-28 ENCOUNTER — Emergency Department (HOSPITAL_BASED_OUTPATIENT_CLINIC_OR_DEPARTMENT_OTHER): Payer: 59

## 2015-03-28 ENCOUNTER — Encounter (HOSPITAL_BASED_OUTPATIENT_CLINIC_OR_DEPARTMENT_OTHER): Payer: Self-pay | Admitting: Emergency Medicine

## 2015-03-28 DIAGNOSIS — Z7984 Long term (current) use of oral hypoglycemic drugs: Secondary | ICD-10-CM | POA: Diagnosis not present

## 2015-03-28 DIAGNOSIS — R079 Chest pain, unspecified: Secondary | ICD-10-CM | POA: Diagnosis not present

## 2015-03-28 DIAGNOSIS — R000 Tachycardia, unspecified: Secondary | ICD-10-CM

## 2015-03-28 DIAGNOSIS — R200 Anesthesia of skin: Secondary | ICD-10-CM | POA: Diagnosis not present

## 2015-03-28 DIAGNOSIS — R0789 Other chest pain: Secondary | ICD-10-CM

## 2015-03-28 DIAGNOSIS — Z79899 Other long term (current) drug therapy: Secondary | ICD-10-CM | POA: Diagnosis not present

## 2015-03-28 DIAGNOSIS — R002 Palpitations: Secondary | ICD-10-CM | POA: Diagnosis not present

## 2015-03-28 LAB — COMPREHENSIVE METABOLIC PANEL WITH GFR
ALT: 17 U/L (ref 14–54)
AST: 19 U/L (ref 15–41)
Albumin: 4.1 g/dL (ref 3.5–5.0)
Alkaline Phosphatase: 56 U/L (ref 38–126)
Anion gap: 5 (ref 5–15)
BUN: 14 mg/dL (ref 6–20)
CO2: 27 mmol/L (ref 22–32)
Calcium: 9.3 mg/dL (ref 8.9–10.3)
Chloride: 105 mmol/L (ref 101–111)
Creatinine, Ser: 0.65 mg/dL (ref 0.44–1.00)
GFR calc Af Amer: >60 mL/min
GFR calc non Af Amer: >60 mL/min
Glucose, Bld: 121 mg/dL — ABNORMAL HIGH (ref 65–99)
Potassium: 3.5 mmol/L (ref 3.5–5.1)
Sodium: 137 mmol/L (ref 135–145)
Total Bilirubin: 0.4 mg/dL (ref 0.3–1.2)
Total Protein: 7.1 g/dL (ref 6.5–8.1)

## 2015-03-28 LAB — D-DIMER, QUANTITATIVE (NOT AT ARMC): D DIMER QUANT: 0.3 ug{FEU}/mL (ref 0.00–0.50)

## 2015-03-28 LAB — TROPONIN I
Troponin I: <0.03 ng/mL
Troponin I: <0.03 ng/mL (ref <0.031)

## 2015-03-28 LAB — CBC
HCT: 39.8 % (ref 36.0–46.0)
Hemoglobin: 12.8 g/dL (ref 12.0–15.0)
MCH: 28.1 pg (ref 26.0–34.0)
MCHC: 32.2 g/dL (ref 30.0–36.0)
MCV: 87.3 fL (ref 78.0–100.0)
Platelets: 232 K/uL (ref 150–400)
RBC: 4.56 MIL/uL (ref 3.87–5.11)
RDW: 12.5 % (ref 11.5–15.5)
WBC: 7.8 K/uL (ref 4.0–10.5)

## 2015-03-28 NOTE — Telephone Encounter (Signed)
Will you please let patient know that I put in a referral for a cardiologist just in case the ED visit tonight does not find a reason for her discomfort/numbness.

## 2015-03-28 NOTE — Telephone Encounter (Signed)
Pt's husband called stating Pt is comlaining on some slight chest discomfort and left arm tingling/numbness. Husband states she has tried Tums and Ranitidine with no relief. Per PCP, Pt should go to ED. Husband agrees and will take her now. Will route note to PCP to see if Cardiology referral would be appropriate per Husband inquiry.

## 2015-03-28 NOTE — ED Provider Notes (Signed)
CSN: [id number]     Arrival date & time 03/28/15  1712 History  By signing my name below, I, Wendy Anderson, attest that this documentation has been prepared under the direction and in the presence of No att. providers found. Electronically Signed: Erling Anderson, ED Scribe. 03/29/2015. 12:51 AM.    Chief Complaint  Patient presents with  . Tachycardia   The history is provided by the patient. No language interpreter was used.    HPI Comments: Wendy Anderson is a 45 y.o. female with a h/o prediabetes who presents to the Emergency Department complaining of intermittent, moderate, left sided chest discomfort onset 2-3 weeks. She states the pain at times radiates to her left upper back. She reports associated heart palpitations and numbness to her left arm. Pt notes her heart rate has gotten as high as in the 130s at rest. She reports that this pain and numbness is often reproducible with exertion. She denies any h/o heart issues or cardiac workups. She notes she has a h/o elevated BP but does not want to take medication for it. She has not taken any medications prior to arrival. Pt reports she has tried Tums initially thinking it was GERD and states the Tums provided mild relief. Pt is a non smoker. She is not currently on BC pills. She denies any recent travel or immobility. Pt denies any recent strenuous activity. Pt denies any known family history of early onset cardiac disease. She notes that she has been under a lot of stress at work lately which has caused her BP to be higher than normal. She denies any leg swelling, SOB, cough or other associated symptoms.  Past Medical History  Diagnosis Date  . Prediabetes    Past Surgical History  Procedure Laterality Date  . Partial hysterectomy    . Bladder tack    . Umbilical hernia repair    . Teratoma excision      bilateral  . Bone exostosis excision      removal from left femur(benign)  . Total hip arthroplasty      right hip  . Removal of  bone spurs      form 4th and 5th toes   Family History  Problem Relation Age of Onset  . Lung cancer Mother     deceased at age 63  . Heart attack Maternal Grandfather   . Diabetes Paternal Grandfather   . Hyperlipidemia Paternal Grandfather   . Neurologic Disorder Father     Primary Progressive Aphasia   Social History  Substance Use Topics  . Smoking status: Never Smoker   . Smokeless tobacco: None  . Alcohol Use: Yes     Comment: socially   OB History    No data available     Review of Systems  Respiratory: Negative for cough and shortness of breath.   Cardiovascular: Positive for chest pain and palpitations. Negative for leg swelling.  Neurological: Positive for numbness.      Allergies  Dolobid; Ivp dye; Percocet; and Codeine  Home Medications   Prior to Admission medications   Medication Sig Start Date End Date Taking? Authorizing Provider  Calcium Carb-Cholecalciferol (CALCIUM-VITAMIN D) 500-400 MG-UNIT TABS Take 1 tablet by mouth.    Historical Provider, MD  dapagliflozin propanediol (FARXIGA) 5 MG TABS tablet Take 5 mg by mouth daily. 03/12/15   Sean Hommel, DO  meloxicam (MOBIC) 15 MG tablet TAKE 1 TABLET (15 MG TOTAL) BY MOUTH DAILY. Patient not taking: Reported on 12/07/2014 07/26/13  Marcial Pacas, DO  mupirocin nasal ointment (BACTROBAN NASAL) 2 % Place 1 application into the nose 2 (two) times daily. Apply small dab to left nostril sore twice a day for two weeks. 12/07/14   Marcial Pacas, DO  traZODone (DESYREL) 50 MG tablet Take 0.5-1 tablets (25-50 mg total) by mouth at bedtime as needed for sleep. 12/07/14   Marcial Pacas, DO  Vitamin D, Ergocalciferol, (DRISDOL) 50000 UNITS CAPS capsule Take 1 capsule (50,000 Units total) by mouth every 7 (seven) days. 03/12/15   Marcial Pacas, DO   Triage Vitals: BP 149/96 mmHg  Pulse 121  Temp(Src) 98.1 F (36.7 C) (Oral)  Resp 18  SpO2 98%  Physical Exam  Constitutional: She is oriented to person, place, and time. She  appears well-developed and well-nourished. No distress.  HENT:  Head: Normocephalic and atraumatic.  Eyes: Conjunctivae and EOM are normal.  Neck: Neck supple. No tracheal deviation present.  Cardiovascular: Regular rhythm and normal heart sounds.  Tachycardia present.   No murmur heard. No lower extremity swelling  Pulmonary/Chest: Effort normal. No respiratory distress.  Musculoskeletal: Normal range of motion.  Neurological: She is alert and oriented to person, place, and time.  Skin: Skin is warm and dry.  Psychiatric: She has a normal mood and affect. Her behavior is normal.  Nursing note and vitals reviewed.   ED Course  Procedures (including critical care time)  Results for orders placed or performed during the hospital encounter of 03/28/15  Troponin I  Result Value Ref Range   Troponin I <0.03 <0.031 ng/mL  CBC  Result Value Ref Range   WBC 7.8 4.0 - 10.5 K/uL   RBC 4.56 3.87 - 5.11 MIL/uL   Hemoglobin 12.8 12.0 - 15.0 g/dL   HCT 39.8 36.0 - 46.0 %   MCV 87.3 78.0 - 100.0 fL   MCH 28.1 26.0 - 34.0 pg   MCHC 32.2 30.0 - 36.0 g/dL   RDW 12.5 11.5 - 15.5 %   Platelets 232 150 - 400 K/uL  Comprehensive metabolic panel  Result Value Ref Range   Sodium 137 135 - 145 mmol/L   Potassium 3.5 3.5 - 5.1 mmol/L   Chloride 105 101 - 111 mmol/L   CO2 27 22 - 32 mmol/L   Glucose, Bld 121 (H) 65 - 99 mg/dL   BUN 14 6 - 20 mg/dL   Creatinine, Ser 0.65 0.44 - 1.00 mg/dL   Calcium 9.3 8.9 - 10.3 mg/dL   Total Protein 7.1 6.5 - 8.1 g/dL   Albumin 4.1 3.5 - 5.0 g/dL   AST 19 15 - 41 U/L   ALT 17 14 - 54 U/L   Alkaline Phosphatase 56 38 - 126 U/L   Total Bilirubin 0.4 0.3 - 1.2 mg/dL   GFR calc non Af Amer >60 >60 mL/min   GFR calc Af Amer >60 >60 mL/min   Anion gap 5 5 - 15  D-dimer, quantitative (not at [facility])  Result Value Ref Range   D-Dimer, Quant 0.30 0.00 - 0.50 ug/mL-FEU  Troponin I  Result Value Ref Range   Troponin I <0.03 <0.031 ng/mL   Dg Chest 2  View  03/28/2015  CLINICAL DATA:  Chest pain for 5 weeks EXAM: CHEST  2 VIEW COMPARISON:  None. FINDINGS: Lungs are clear. Heart size and pulmonary vascularity are normal. No adenopathy. No pneumothorax. No bone lesions. IMPRESSION: No edema or consolidation. Electronically Signed   By: Lowella Grip III M.D.   On: 03/28/2015 18:03  I have personally reviewed and evaluated these images and lab results as part of my medical decision-making.   EKG Interpretation   Date/Time:  Wednesday March 28 2015 17:16:56 EST Ventricular Rate:  121 PR Interval:  148 QRS Duration: 82 QT Interval:  310 QTC Calculation: 440 R Axis:   84 Text Interpretation:  Sinus tachycardia Possible Left atrial enlargement  Borderline ECG Confirmed by Trenae Brunke  MD, Ovid Curd [phone]) on 03/28/2015  6:26:06 PM      MDM   Final diagnoses:  Chest pain, unspecified chest pain type  Tachycardia    Patient with chest pain with some shortness of breath and tachycardia. EKG and lab work reassuring. Troponin negative 2. Will follow-up with likely outpatient stress test. Patient's primary care is reportedly going to set it up. Will discharge. I personally performed the services described in this documentation, which was scribed in my presence. The recorded information has been reviewed and is accurate.       Davonna Belling, MD 03/29/15 [phone]

## 2015-03-28 NOTE — ED Notes (Signed)
Patient states that he has had pressure to her chest for the last 2 -3 weeks. Went PCP last Monday and went home without any complications. Patient states that she was walking up a hill today and became SOB with left arm numbness.

## 2015-03-28 NOTE — Discharge Instructions (Signed)
Nonspecific Chest Pain  Chest pain can be caused by many different conditions. There is always a chance that your pain could be related to something serious, such as a heart attack or a blood clot in your lungs. Chest pain can also be caused by conditions that are not life-threatening. If you have chest pain, it is very important to follow up with your health care provider. CAUSES  Chest pain can be caused by:  Heartburn.  Pneumonia or bronchitis.  Anxiety or stress.  Inflammation around your heart (pericarditis) or lung (pleuritis or pleurisy).  A blood clot in your lung.  A collapsed lung (pneumothorax). It can develop suddenly on its own (spontaneous pneumothorax) or from trauma to the chest.  Shingles infection (varicella-zoster virus).  Heart attack.  Damage to the bones, muscles, and cartilage that make up your chest wall. This can include:  Bruised bones due to injury.  Strained muscles or cartilage due to frequent or repeated coughing or overwork.  Fracture to one or more ribs.  Sore cartilage due to inflammation (costochondritis). RISK FACTORS  Risk factors for chest pain may include:  Activities that increase your risk for trauma or injury to your chest.  Respiratory infections or conditions that cause frequent coughing.  Medical conditions or overeating that can cause heartburn.  Heart disease or family history of heart disease.  Conditions or health behaviors that increase your risk of developing a blood clot.  Having had chicken pox (varicella zoster). SIGNS AND SYMPTOMS Chest pain can feel like:  Burning or tingling on the surface of your chest or deep in your chest.  Crushing, pressure, aching, or squeezing pain.  Dull or sharp pain that is worse when you move, cough, or take a deep breath.  Pain that is also felt in your back, neck, shoulder, or arm, or pain that spreads to any of these areas. Your chest pain may come and go, or it may stay  constant. DIAGNOSIS Lab tests or other studies may be needed to find the cause of your pain. Your health care provider may have you take a test called an ambulatory ECG (electrocardiogram). An ECG records your heartbeat patterns at the time the test is performed. You may also have other tests, such as:  Transthoracic echocardiogram (TTE). During echocardiography, sound waves are used to create a picture of all of the heart structures and to look at how blood flows through your heart.  Transesophageal echocardiogram (TEE).This is a more advanced imaging test that obtains images from inside your body. It allows your health care provider to see your heart in finer detail.  Cardiac monitoring. This allows your health care provider to monitor your heart rate and rhythm in real time.  Holter monitor. This is a portable device that records your heartbeat and can help to diagnose abnormal heartbeats. It allows your health care provider to track your heart activity for several days, if needed.  Stress tests. These can be done through exercise or by taking medicine that makes your heart beat more quickly.  Blood tests.  Imaging tests. TREATMENT  Your treatment depends on what is causing your chest pain. Treatment may include:  Medicines. These may include:  Acid blockers for heartburn.  Anti-inflammatory medicine.  Pain medicine for inflammatory conditions.  Antibiotic medicine, if an infection is present.  Medicines to dissolve blood clots.  Medicines to treat coronary artery disease.  Supportive care for conditions that do not require medicines. This may include:  Resting.  Applying heat  or cold packs to injured areas.  Limiting activities until pain decreases. HOME CARE INSTRUCTIONS  If you were prescribed an antibiotic medicine, finish it all even if you start to feel better.  Avoid any activities that bring on chest pain.  Do not use any tobacco products, including  cigarettes, chewing tobacco, or electronic cigarettes. If you need help quitting, ask your health care provider.  Do not drink alcohol.  Take medicines only as directed by your health care provider.  Keep all follow-up visits as directed by your health care provider. This is important. This includes any further testing if your chest pain does not go away.  If heartburn is the cause for your chest pain, you may be told to keep your head raised (elevated) while sleeping. This reduces the chance that acid will go from your stomach into your esophagus.  Make lifestyle changes as directed by your health care provider. These may include:  Getting regular exercise. Ask your health care provider to suggest some activities that are safe for you.  Eating a heart-healthy diet. A registered dietitian can help you to learn healthy eating options.  Maintaining a healthy weight.  Managing diabetes, if necessary.  Reducing stress. SEEK MEDICAL CARE IF:  Your chest pain does not go away after treatment.  You have a rash with blisters on your chest.  You have a fever. SEEK IMMEDIATE MEDICAL CARE IF:   Your chest pain is worse.  You have an increasing cough, or you cough up blood.  You have severe abdominal pain.  You have severe weakness.  You faint.  You have chills.  You have sudden, unexplained chest discomfort.  You have sudden, unexplained discomfort in your arms, back, neck, or jaw.  You have shortness of breath at any time.  You suddenly start to sweat, or your skin gets clammy.  You feel nauseous or you vomit.  You suddenly feel light-headed or dizzy.  Your heart begins to beat quickly, or it feels like it is skipping beats. These symptoms may represent a serious problem that is an emergency. Do not wait to see if the symptoms will go away. Get medical help right away. Call your local emergency services (911 in the [place] Do not drive yourself to the hospital.   This  information is not intended to replace advice given to you by your health care provider. Make sure you discuss any questions you have with your health care provider.   Document Released: 12/18/2004 Document Revised: 03/31/2014 Document Reviewed: 10/14/2013 Elsevier Interactive Patient Education 2016 Else[employer] Inc[employer]  Nonspecific Tachycardia Tachycardia is a faster than normal heartbeat (more than 100 beats per minute). In adults, the heart normally beats between 60 and 100 times a minute. A fast heartbeat may be a normal response to exercise or stress. It does not necessarily mean that something is wrong. However, sometimes when your heart beats too fast it may not be able to pump enough blood to the rest of your body. This can result in chest pain, shortness of breath, dizziness, and even fainting. Nonspecific tachycardia means that the specific cause or pattern of your tachycardia is unknown. CAUSES  Tachycardia may be harmless or it may be due to a more serious underlying cause. Possible causes of tachycardia include:  Exercise or exertion.  Fever.  Pain or injury.  Infection.  Loss of body fluids (dehydration).  Overactive thyroid.  Lack of red blood cells (anemia).  Anxiety and stress.  Alcohol.  Caffeine.  Tobacco  products.  Diet pills.  Illegal drugs.  Heart disease. SYMPTOMS  Rapid or irregular heartbeat (palpitations).  Suddenly feeling your heart beating (cardiac awareness).  Dizziness.  Tiredness (fatigue).  Shortness of breath.  Chest pain.  Nausea.  Fainting. DIAGNOSIS  Your caregiver will perform a physical exam and take your medical history. In some cases, a heart specialist (cardiologist) may be consulted. Your caregiver may also order:  Blood tests.  Electrocardiography. This test records the electrical activity of your heart.  A heart monitoring test. TREATMENT  Treatment will depend on the likely cause of your tachycardia. The goal is to  treat the underlying cause of your tachycardia. Treatment methods may include:  Replacement of fluids or blood through an intravenous (IV) tube for moderate to severe dehydration or anemia.  New medicines or changes in your current medicines.  Diet and lifestyle changes.  Treatment for certain infections.  Stress relief or relaxation methods. HOME CARE INSTRUCTIONS   Rest.  Drink enough fluids to keep your urine clear or pale yellow.  Do not smoke.  Avoid:  Caffeine.  Tobacco.  Alcohol.  Chocolate.  Stimulants such as over-the-counter diet pills or pills that help you stay awake.  Situations that cause anxiety or stress.  Illegal drugs such as marijuana, phencyclidine (PCP), and cocaine.  Only take medicine as directed by your caregiver.  Keep all follow-up appointments as directed by your caregiver. SEEK IMMEDIATE MEDICAL CARE IF:   You have pain in your chest, upper arms, jaw, or neck.  You become weak, dizzy, or feel faint.  You have palpitations that will not go away.  You vomit, have diarrhea, or pass blood in your stool.  Your skin is cool, pale, and wet.  You have a fever that will not go away with rest, fluids, and medicine. MAKE SURE YOU:   Understand these instructions.  Will watch your condition.  Will get help right away if you are not doing well or get worse.   This information is not intended to replace advice given to you by your health care provider. Make sure you discuss any questions you have with your health care provider.   Document Released: 04/17/2004 Document Revised: 06/02/2011 Document Reviewed: 09/22/2014 Elsevier Interactive Patient Education [employer].

## 2015-03-30 NOTE — Telephone Encounter (Signed)
Spoke with Pt, she has already scheduled an appt with Cardiology.

## 2015-04-18 ENCOUNTER — Ambulatory Visit (INDEPENDENT_AMBULATORY_CARE_PROVIDER_SITE_OTHER): Payer: 59 | Admitting: Interventional Cardiology

## 2015-04-18 ENCOUNTER — Encounter: Payer: Self-pay | Admitting: Interventional Cardiology

## 2015-04-18 VITALS — BP 120/80 | HR 88 | Ht 63.0 in | Wt 167.0 lb

## 2015-04-18 DIAGNOSIS — R0789 Other chest pain: Secondary | ICD-10-CM | POA: Diagnosis not present

## 2015-04-18 DIAGNOSIS — E785 Hyperlipidemia, unspecified: Secondary | ICD-10-CM

## 2015-04-18 DIAGNOSIS — R079 Chest pain, unspecified: Secondary | ICD-10-CM

## 2015-04-18 DIAGNOSIS — R030 Elevated blood-pressure reading, without diagnosis of hypertension: Secondary | ICD-10-CM

## 2015-04-18 NOTE — Progress Notes (Signed)
Patient ID: Wendy Anderson, female   DOB: [birth year], 45 y.o.   MRN: [id number]     Cardiology Office Note   Date:  04/18/2015   ID:  Wendy Anderson, DOB [birth year], MRN [id number]  PCP:  Wendy Pacas, DO    No chief complaint on file. chest pain   Wt Readings from Last 3 Encounters:  04/18/15 167 lb (75.751 kg)  03/12/15 164 lb (74.39 kg)  12/07/14 171 lb (77.565 kg)       History of Present Illness: Wendy Anderson is a 45 y.o. female  Who has had a lot of stress.  SHe has had intermittent chest discomfort several weeks ago.  She went to the ER after having left arm pain with walking up a hill.  It was not severe.  She has taken it easy since that time.    Her husband is disabled.  She is the financial support for her family.  Grandfather with early CAD.  No other siblings that are ill with heart disease.  She is a nonsmoker.  SHe has had high cholesterol.  Diastolic BP has been on the high side.  It has gone to [id number], highest systolic was [id number] mm Hg.  She has let exercise fall lower on her lis tof priorities.  Home life is very busy.  She stays home to take care of her husband who is in the process of getting disability.      Past Medical History  Diagnosis Date  . Prediabetes     Past Surgical History  Procedure Laterality Date  . Partial hysterectomy    . Bladder tack    . Umbilical hernia repair    . Teratoma excision      bilateral  . Bone exostosis excision      removal from left femur(benign)  . Total hip arthroplasty      right hip  . Removal of bone spurs      form 4th and 5th toes     Current Outpatient Prescriptions  Medication Sig Dispense Refill  . amoxicillin (AMOXIL) 500 MG tablet Take 500 mg by mouth. DENTAL WORK    . Calcium Carb-Cholecalciferol (CALCIUM-VITAMIN D) 500-400 MG-UNIT TABS Take 1 tablet by mouth.    . dapagliflozin propanediol (FARXIGA) 5 MG TABS tablet Take 5 mg by mouth daily. 30 tablet 11  . traZODone (DESYREL) 50 MG tablet Take 0.5-1  tablets (25-50 mg total) by mouth at bedtime as needed for sleep. 30 tablet 3  . Vitamin D, Ergocalciferol, (DRISDOL) 50000 UNITS CAPS capsule Take 1 capsule (50,000 Units total) by mouth every 7 (seven) days. 20 capsule 0   No current facility-administered medications for this visit.    Allergies:   Ivp dye; Dolobid; Percocet; Codeine; and Codeine sulfate    Social History:  The patient  reports that she has never smoked. She does not have any smokeless tobacco history on file. She reports that she drinks alcohol. She reports that she does not use illicit drugs.   Family History:  The patient's family history includes Diabetes in her paternal grandfather; Heart attack in her maternal grandfather; Hyperlipidemia in her paternal grandfather; Hypertension in her paternal grandfather; Lung cancer in her mother; Neurologic Disorder in her father. There is no history of Stroke.    ROS:  Please see the history of present illness.   Otherwise, review of systems are positive for  Chest discomfort.   All other systems are reviewed and negative.    PHYSICAL EXAM: VS:  BP 120/80 mmHg  Pulse 88  Ht 5\' 3"  (1.6 m)  Wt 167 lb (75.751 kg)  BMI 29.59 kg/m2 , BMI Body mass index is 29.59 kg/(m^2). GEN: Well nourished, well developed, in no acute distress HEENT: normal Neck: no JVD, carotid bruits, or masses Cardiac: RRR; no murmurs, rubs, or gallops,no edema  Respiratory:  clear to auscultation bilaterally, normal work of breathing GI: soft, nontender, nondistended, + BS MS: no deformity or atrophy Skin: warm and dry, no rash Neuro:  Strength and sensation are intact Psych: euthymic mood, full affect   EKG:   The ekg from the ER showed sinus tach with nonspecific ST segment changes   Recent Labs: 03/28/2015: ALT 17; BUN 14; Creatinine, Ser 0.65; Hemoglobin 12.8; Platelets 232; Potassium 3.5; Sodium 137   Lipid Panel    Component Value Date/Time   CHOL 244* 12/07/2014 1002   TRIG 186*  12/07/2014 1002   HDL 48 12/07/2014 1002   CHOLHDL 5.1* 12/07/2014 1002   VLDL 37* 12/07/2014 1002   LDLCALC 159* 12/07/2014 1002     Other studies Reviewed: Additional studies/ records that were reviewed today with results demonstrating: ER records reviewed.   ASSESSMENT AND PLAN:  1. Chest pain:  Some typical and some atypical features. We'll plan for stress echocardiogram to evaluate for ischemia and to help guide her exercise in the future.  2.  hyperlipidemia: hopefully, with diet and exercise , her lipids will come down. She is not willing to take a statin. She would have to take some other type of medicine if indicated. At this point, there is not a strong indication to treat her lipids. 3.  We talked about stress as a possible cause. Before attributing her symptoms to stress, we need to rule out a physical cause. Stress management will be important for her long-term.   4. Borderline BP at times.  Elevated at times.  Would have her  Try to increase exercise to bring her blood pressure. Hopefully, with lifestyle modifications, she can  Avoid medications.   Current medicines are reviewed at length with the patient today.  The patient concerns regarding her medicines were addressed.  The following changes have been made:  No change  Labs/ tests ordered today include:  No orders of the defined types were placed in this encounter.    Recommend 150 minutes/week of aerobic exercise Low fat, low carb, high fiber diet recommended  Disposition:   FU for stress echo   Teresita Madura., MD  04/18/2015 2:28 PM    [facility] [address], [place], [place]  [address] Phone: [phone]; Fax: [phone]

## 2015-04-18 NOTE — Patient Instructions (Signed)
Medication Instructions:  None  Labwork: None  Testing/Procedures: Your physician has requested that you have a stress echocardiogram. For further information please visit [email or web address]. Please follow instruction sheet as given.   Follow-Up: Your physician recommends that you schedule a follow-up appointment as needed with Dr. Irish Lack.  Any Other Special Instructions Will Be Listed Below (If Applicable).     If you need a refill on your cardiac medications before your next appointment, please call your pharmacy.

## 2015-05-02 ENCOUNTER — Ambulatory Visit (HOSPITAL_BASED_OUTPATIENT_CLINIC_OR_DEPARTMENT_OTHER): Payer: 59

## 2015-05-02 ENCOUNTER — Ambulatory Visit (HOSPITAL_COMMUNITY): Payer: 59 | Attending: Cardiovascular Disease

## 2015-05-02 DIAGNOSIS — R0989 Other specified symptoms and signs involving the circulatory and respiratory systems: Secondary | ICD-10-CM

## 2015-05-02 DIAGNOSIS — R079 Chest pain, unspecified: Secondary | ICD-10-CM

## 2015-05-03 LAB — ECHOCARDIOGRAM STRESS TEST
CHL CUP STRESS STAGE 1 DBP: 90 mmHg
CHL CUP STRESS STAGE 1 SBP: 128 mmHg
CHL CUP STRESS STAGE 2 GRADE: 0 %
CHL CUP STRESS STAGE 2 HR: 100 {beats}/min
CHL CUP STRESS STAGE 2 SPEED: 0 mph
CHL CUP STRESS STAGE 3 GRADE: 0 %
CHL CUP STRESS STAGE 3 HR: 101 {beats}/min
CHL CUP STRESS STAGE 3 SPEED: 0 mph
CHL CUP STRESS STAGE 4 HR: 134 {beats}/min
CHL CUP STRESS STAGE 4 SBP: 162 mmHg
CHL CUP STRESS STAGE 5 DBP: 76 mmHg
CHL CUP STRESS STAGE 5 GRADE: 12 %
CHL CUP STRESS STAGE 6 SPEED: 3.4 mph
CHL CUP STRESS STAGE 7 HR: 187 {beats}/min
CHL CUP STRESS STAGE 8 DBP: 102 mmHg
CHL CUP STRESS STAGE 8 SBP: 161 mmHg
CHL CUP STRESS STAGE 9 SPEED: 0 mph
Estimated workload: 6.9 METS
Peak HR: 187 {beats}/min
Percent of predicted max HR: 107 %
Stage 1 Grade: 0 %
Stage 1 HR: 91 {beats}/min
Stage 1 Speed: 0 mph
Stage 2 DBP: 90 mmHg
Stage 2 SBP: 126 mmHg
Stage 4 DBP: 86 mmHg
Stage 4 Grade: 10 %
Stage 4 Speed: 1.7 mph
Stage 5 HR: 160 {beats}/min
Stage 5 SBP: 171 mmHg
Stage 5 Speed: 2.5 mph
Stage 6 DBP: 83 mmHg
Stage 6 Grade: 14 %
Stage 6 HR: 187 {beats}/min
Stage 6 SBP: 185 mmHg
Stage 8 Grade: 0 %
Stage 8 HR: 146 {beats}/min
Stage 8 Speed: 0 mph
Stage 9 Grade: 0 %
Stage 9 HR: 115 {beats}/min

## 2015-06-11 ENCOUNTER — Encounter: Payer: Self-pay | Admitting: Family Medicine

## 2015-06-11 ENCOUNTER — Ambulatory Visit (INDEPENDENT_AMBULATORY_CARE_PROVIDER_SITE_OTHER): Payer: 59 | Admitting: Family Medicine

## 2015-06-11 VITALS — BP 142/87 | HR 78 | Wt 166.0 lb

## 2015-06-11 DIAGNOSIS — I10 Essential (primary) hypertension: Secondary | ICD-10-CM | POA: Diagnosis not present

## 2015-06-11 DIAGNOSIS — R7303 Prediabetes: Secondary | ICD-10-CM | POA: Diagnosis not present

## 2015-06-11 LAB — POCT GLYCOSYLATED HEMOGLOBIN (HGB A1C): HEMOGLOBIN A1C: 5.9

## 2015-06-11 NOTE — Progress Notes (Signed)
CC: Wendy Anderson is a 45 y.o. female is here for prediabetes   Subjective: HPI:   follow-up prediabetes: Taking farxiga  Daily with no known side effects. No outside blood sugars to report. Denies polyuria polyphagia or polydipsia.   Follow-up essential hypertension: she is at 50% of the time her blood pressures in the stage I hypertension range and 50% of the time it's either normotensive or prehypertensive. She denies any chest pain shortness of breath orthopnea nor peripheral edema since I saw her last. She had a reassuring stress echocardiogram about a month ago. No formal exercise routine or dietary restrictions at the current time.   Review Of Systems Outlined In HPI  Past Medical History  Diagnosis Date  . Prediabetes     Past Surgical History  Procedure Laterality Date  . Partial hysterectomy    . Bladder tack    . Umbilical hernia repair    . Teratoma excision      bilateral  . Bone exostosis excision      removal from left femur(benign)  . Total hip arthroplasty      right hip  . Removal of bone spurs      form 4th and 5th toes   Family History  Problem Relation Age of Onset  . Lung cancer Mother     deceased at age 24  . Heart attack Maternal Grandfather   . Diabetes Paternal Grandfather   . Hyperlipidemia Paternal Grandfather   . Neurologic Disorder Father     Primary Progressive Aphasia  . Hypertension Paternal Grandfather   . Stroke Neg Hx     Social History   Social History  . Marital Status: Married    Spouse Name: N/A  . Number of Children: N/A  . Years of Education: N/A   Occupational History  . Not on file.   Social History Main Topics  . Smoking status: Never Smoker   . Smokeless tobacco: Not on file  . Alcohol Use: Yes     Comment: socially  . Drug Use: No  . Sexual Activity:    Partners: Male   Other Topics Concern  . Not on file   Social History Narrative     Objective: BP 142/87 mmHg  Pulse 78  Wt 166 lb (75.297  kg)  Vital signs reviewed. General: Alert and Oriented, No Acute Distress HEENT: Pupils equal, round, reactive to light. Conjunctivae clear.  External ears unremarkable.  Moist mucous membranes. Lungs: Clear and comfortable work of breathing, speaking in full sentences without accessory muscle use. Cardiac: Regular rate and rhythm.  Neuro: CN II-XII grossly intact, gait normal. Extremities: No peripheral edema.  Strong peripheral pulses.  Mental Status: No depression, anxiety, nor agitation. Logical though process. Skin: Warm and dry.  Assessment & Plan: Wendy Anderson was seen today for prediabetes.  Diagnoses and all orders for this visit:  Prediabetes -     POCT HgB A1C  Essential hypertension   Prediabetes: A1c of 5.9, stable, continue daily farxiga. Essential hypertension: Uncontrolled chronic condition. She is very optimistic that she is going to be able to stick to a new walking regimen where she exercises at least 30-60 minutes a day. She bought a fitbit hopes that this will keep her accountable for daily exercise. Also discussed dietary interventions to help lower blood pressure.  Return if symptoms worsen or fail to improve.

## 2015-07-20 ENCOUNTER — Encounter: Payer: Self-pay | Admitting: Family Medicine

## 2015-07-20 ENCOUNTER — Ambulatory Visit (INDEPENDENT_AMBULATORY_CARE_PROVIDER_SITE_OTHER): Payer: 59 | Admitting: Family Medicine

## 2015-07-20 VITALS — BP 142/93 | HR 73 | Wt 160.0 lb

## 2015-07-20 DIAGNOSIS — T148XXA Other injury of unspecified body region, initial encounter: Secondary | ICD-10-CM

## 2015-07-20 DIAGNOSIS — M25511 Pain in right shoulder: Secondary | ICD-10-CM | POA: Diagnosis not present

## 2015-07-20 NOTE — Progress Notes (Signed)
CC: Wendy Anderson is a 45 y.o. female is here for spot on chest   Subjective: HPI:  47mm pigmented skin lesion that she noticed on Thursday to never been there before. It's painless and does not itch. She's tried squeezing it but nothing happens. It's not getting larger or smaller. She's never had this before. She denies skin lesions elsewhere.  Complains of right shoulder pain ever since she spent the whole day doing [employer] for charity. It's worse when reaching over to the right, reaching above her shoulders and sometimes radiates down the left arm. She denies any other trauma. She denies any motor or sensory disturbances in the right upper extremity. She denies swelling redness or warmth.     Review Of Systems Outlined In HPI  Past Medical History  Diagnosis Date  . Prediabetes     Past Surgical History  Procedure Laterality Date  . Partial hysterectomy    . Bladder tack    . Umbilical hernia repair    . Teratoma excision      bilateral  . Bone exostosis excision      removal from left femur(benign)  . Total hip arthroplasty      right hip  . Removal of bone spurs      form 4th and 5th toes   Family History  Problem Relation Age of Onset  . Lung cancer Mother     deceased at age 45  . Heart attack Maternal Grandfather   . Diabetes Paternal Grandfather   . Hyperlipidemia Paternal Grandfather   . Neurologic Disorder Father     Primary Progressive Aphasia  . Hypertension Paternal Grandfather   . Stroke Neg Hx     Social History   Social History  . Marital Status: Married    Spouse Name: N/A  . Number of Children: N/A  . Years of Education: N/A   Occupational History  . Not on file.   Social History Main Topics  . Smoking status: Never Smoker   . Smokeless tobacco: Not on file  . Alcohol Use: Yes     Comment: socially  . Drug Use: No  . Sexual Activity:    Partners: Male   Other Topics Concern  . Not on file   Social History Narrative      Objective: BP 142/93 mmHg  Pulse 73  Wt 160 lb (72.576 kg)  Vital signs reviewed. General: Alert and Oriented, No Acute Distress HEENT: Pupils equal, round, reactive to light. Conjunctivae clear.  External ears unremarkable.  Moist mucous membranes. Lungs: Clear and comfortable work of breathing, speaking in full sentences without accessory muscle use. Cardiac: Regular rate and rhythm.  Right shoulder exam reveals full range of motion and strength in all planes of motion and with individual rotator cuff testing. No overlying redness warmth or swelling.  Neer's test positive.  Hawkins test positive. Empty can negative. Crossarm test negative. O'Brien's test negative. Apprehension test negative. Speed's test negative. Extremities: No peripheral edema.  Strong peripheral pulses.  Mental Status: No depression, anxiety, nor agitation. Logical though process. Skin: Warm and dry. 3 mm diameter purple colored macule on the left chest  Assessment & Plan: Kahlina was seen today for spot on chest.  Diagnoses and all orders for this visit:  Right shoulder pain  Hematoma   The spot on her chest looks to be a small hematoma I have asked her to come back in 2 weeks if it's not gone away on its own. Right shoulder pain:  Discussed home physical therapy she would like a quick fix therefore a steroid injection was given today. Subacromial Shoulder Injection Procedure Note  Pre-operative Diagnosis: right shoulder pain  Post-operative Diagnosis: same  Indications: pain  Anesthesia: topical cold spray  Procedure Details   Verbal consent was obtained for the procedure. The shoulder was prepped with Betadine and the skin was anesthetized. A 27 gauge needle was advanced into the subacromial space through posterior approach without difficulty  The space was then injected with 2 ml 1% lidocaine and 2 ml of triamcinolone (KENALOG) 40mg /ml. The injection site was cleansed with isopropyl alcohol and a  dressing was applied.  Complications:  None; patient tolerated the procedure well.   Return if symptoms worsen or fail to improve.

## 2015-09-11 ENCOUNTER — Ambulatory Visit: Payer: 59 | Admitting: Family Medicine

## 2015-09-14 ENCOUNTER — Ambulatory Visit: Payer: 59 | Admitting: Family Medicine

## 2015-09-19 ENCOUNTER — Ambulatory Visit: Payer: 59 | Admitting: Family Medicine

## 2015-09-19 ENCOUNTER — Emergency Department (HOSPITAL_BASED_OUTPATIENT_CLINIC_OR_DEPARTMENT_OTHER)
Admission: EM | Admit: 2015-09-19 | Discharge: 2015-09-19 | Disposition: A | Discharge status: Home / Self Care | Payer: 59 | Attending: Emergency Medicine | Admitting: Emergency Medicine

## 2015-09-19 ENCOUNTER — Encounter (HOSPITAL_BASED_OUTPATIENT_CLINIC_OR_DEPARTMENT_OTHER): Payer: Self-pay | Admitting: *Deleted

## 2015-09-19 ENCOUNTER — Emergency Department (HOSPITAL_BASED_OUTPATIENT_CLINIC_OR_DEPARTMENT_OTHER): Payer: 59

## 2015-09-19 DIAGNOSIS — R519 Headache, unspecified: Secondary | ICD-10-CM

## 2015-09-19 DIAGNOSIS — R110 Nausea: Secondary | ICD-10-CM | POA: Insufficient documentation

## 2015-09-19 DIAGNOSIS — R51 Headache: Secondary | ICD-10-CM | POA: Diagnosis present

## 2015-09-19 DIAGNOSIS — Z79899 Other long term (current) drug therapy: Secondary | ICD-10-CM | POA: Insufficient documentation

## 2015-09-19 HISTORY — DX: Migraine, unspecified, not intractable, without status migrainosus: G43.909

## 2015-09-19 LAB — CBC
HEMATOCRIT: 39.6 % (ref 36.0–46.0)
HEMOGLOBIN: 12.9 g/dL (ref 12.0–15.0)
MCH: 28.8 pg (ref 26.0–34.0)
MCHC: 32.6 g/dL (ref 30.0–36.0)
MCV: 88.4 fL (ref 78.0–100.0)
Platelets: 219 10*3/uL (ref 150–400)
RBC: 4.48 MIL/uL (ref 3.87–5.11)
RDW: 13.4 % (ref 11.5–15.5)
WBC: 9.3 10*3/uL (ref 4.0–10.5)

## 2015-09-19 LAB — BASIC METABOLIC PANEL
Anion gap: 4 — ABNORMAL LOW (ref 5–15)
BUN: 14 mg/dL (ref 6–20)
CHLORIDE: 102 mmol/L (ref 101–111)
CO2: 28 mmol/L (ref 22–32)
CREATININE: 0.73 mg/dL (ref 0.44–1.00)
Calcium: 8.9 mg/dL (ref 8.9–10.3)
GFR calc non Af Amer: >60 mL/min (ref >60)
GLUCOSE: 106 mg/dL — AB (ref 65–99)
Potassium: 3.6 mmol/L (ref 3.5–5.1)
Sodium: 134 mmol/L — ABNORMAL LOW (ref 135–145)

## 2015-09-19 MED ORDER — SODIUM CHLORIDE 0.9 % IV BOLUS (SEPSIS)
1000.0000 mL | Freq: Once | INTRAVENOUS | Status: AC
  Administered 2015-09-19: 1000 mL via INTRAVENOUS

## 2015-09-19 MED ORDER — DIPHENHYDRAMINE HCL 50 MG/ML IJ SOLN
25.0000 mg | Freq: Once | INTRAMUSCULAR | Status: AC
  Administered 2015-09-19: 25 mg via INTRAVENOUS
  Filled 2015-09-19: qty 1

## 2015-09-19 MED ORDER — KETOROLAC TROMETHAMINE 30 MG/ML IJ SOLN
30.0000 mg | Freq: Once | INTRAMUSCULAR | Status: AC
  Administered 2015-09-19: 30 mg via INTRAVENOUS
  Filled 2015-09-19: qty 1

## 2015-09-19 MED ORDER — METOCLOPRAMIDE HCL 5 MG/ML IJ SOLN
5.0000 mg | Freq: Once | INTRAMUSCULAR | Status: DC
  Filled 2015-09-19: qty 2

## 2015-09-19 MED ORDER — PROCHLORPERAZINE EDISYLATE 5 MG/ML IJ SOLN
10.0000 mg | Freq: Once | INTRAMUSCULAR | Status: AC
  Administered 2015-09-19: 10 mg via INTRAVENOUS
  Filled 2015-09-19: qty 2

## 2015-09-19 MED ORDER — DEXAMETHASONE SODIUM PHOSPHATE 10 MG/ML IJ SOLN
10.0000 mg | Freq: Once | INTRAMUSCULAR | Status: AC
  Administered 2015-09-19: 10 mg via INTRAVENOUS
  Filled 2015-09-19: qty 1

## 2015-09-19 MED ORDER — PROMETHAZINE HCL 25 MG/ML IJ SOLN
25.0000 mg | Freq: Once | INTRAMUSCULAR | Status: AC
  Administered 2015-09-19: 25 mg via INTRAVENOUS
  Filled 2015-09-19: qty 1

## 2015-09-19 NOTE — ED Notes (Signed)
O2 at 2liter/m Slaughter applied per EDP request.

## 2015-09-19 NOTE — ED Notes (Signed)
Pt c/o severe headache since 0715. Pt repors pain in left side of head and into jaw. States "worst headache ever". Has hx of tension migraines. No meds taken for pain this morning

## 2015-09-19 NOTE — Discharge Instructions (Signed)
You were seen and evaluated today for your headache. Your CT of your head was normal. Please return if your symptoms suddenly worsen or if you develop weakness or vision changes or other neurologic symptoms.  Please follow-up with your primary care physician for reevaluation and further headache management.  General Headache A headache is pain or discomfort felt around the head or neck area. There are many causes and types of headaches. In some cases, the cause may not be found.  HOME CARE  Managing Pain  Take over-the-counter and prescription medicines only as told by your doctor.  Lie down in a dark, quiet room when you have a headache.  If directed, apply ice to the head and neck area:  Put ice in a plastic bag.  Place a towel between your skin and the bag.  Leave the ice on for 20 minutes, 2-3 times per day.  Use a heating pad or hot shower to apply heat to the head and neck area as told by your doctor.  Keep lights dim if bright lights bother you or make your headaches worse. Eating and Drinking  Eat meals on a regular schedule.  Lessen how much alcohol you drink.  Lessen how much caffeine you drink, or stop drinking caffeine. General Instructions  Keep all follow-up visits as told by your doctor. This is important.  Keep a journal to find out if certain things bring on headaches. For example, write down:  What you eat and drink.  How much sleep you get.  Any change to your diet or medicines.  Relax by getting a massage or doing other relaxing activities.  Lessen stress.  Sit up straight. Do not tighten (tense) your muscles.  Do not use tobacco products. This includes cigarettes, chewing tobacco, or e-cigarettes. If you need help quitting, ask your doctor.  Exercise regularly as told by your doctor.  Get enough sleep. This often means 7-9 hours of sleep. GET HELP IF:  Your symptoms are not helped by medicine.  You have a headache that feels different than  the other headaches.  You feel sick to your stomach (nauseous) or you throw up (vomit).  You have a fever. GET HELP RIGHT AWAY IF:   Your headache becomes really bad.  You keep throwing up.  You have a stiff neck.  You have trouble seeing.  You have trouble speaking.  You have pain in the eye or ear.  Your muscles are weak or you lose muscle control.  You lose your balance or have trouble walking.  You feel like you will pass out (faint) or you pass out.  You have confusion.   This information is not intended to replace advice given to you by your health care provider. Make sure you discuss any questions you have with your health care provider.   Document Released: 12/18/2007 Document Revised: 11/29/2014 Document Reviewed: 07/03/2014 Elsevier Interactive Patient Education [employer].

## 2015-09-19 NOTE — ED Provider Notes (Signed)
CSN: [id number]     Arrival date & time 09/19/15  0802 History   First MD Initiated Contact with Patient 09/19/15 0809     Chief Complaint  Patient presents with  . Headache     (Consider location/radiation/quality/duration/timing/severity/associated sxs/prior Treatment) HPI Comments: 45 year old female with history of reported tension headaches presents for headache. The patient states that she woke up this morning feeling well. She went to work out involved the gym she developed a sudden severe headache in the left side of her head. This started approximately 1 hour and 15 minutes prior to evaluation. She denies fevers or chills. No neck pain. She does report associated nausea and photophobia. No neurologic changes. No vomiting. She says usually her tension headaches are in her neck and the back of her head but this is on the left side of her forehead and temple area. Of note the patient says she was recently in [facility] was extremely hot and she has felt dehydrated ever since being there. She says she has even had muscle cramping because of this. She denies any vision changes.  Patient is a 45 y.o. female presenting with headaches.  Headache Associated symptoms: nausea   Associated symptoms: no abdominal pain, no back pain, no congestion, no cough, no diarrhea, no dizziness, no drainage, no eye pain, no fatigue, no fever, no myalgias, no neck pain, no numbness, no seizures, no sinus pressure, no vomiting and no weakness     Past Medical History  Diagnosis Date  . Prediabetes   . Migraine    Past Surgical History  Procedure Laterality Date  . Partial hysterectomy    . Bladder tack    . Umbilical hernia repair    . Teratoma excision      bilateral  . Bone exostosis excision      removal from left femur(benign)  . Total hip arthroplasty      right hip  . Removal of bone spurs      form 4th and 5th toes   Family History  Problem Relation Age of Onset  . Lung cancer  Mother     deceased at age 78  . Heart attack Maternal Grandfather   . Diabetes Paternal Grandfather   . Hyperlipidemia Paternal Grandfather   . Neurologic Disorder Father     Primary Progressive Aphasia  . Hypertension Paternal Grandfather   . Stroke Neg Hx    Social History  Substance Use Topics  . Smoking status: Never Smoker   . Smokeless tobacco: Never Used  . Alcohol Use: Yes     Comment: socially   OB History    No data available     Review of Systems  Constitutional: Negative for fever, chills, appetite change and fatigue.  HENT: Negative for congestion, postnasal drip, rhinorrhea and sinus pressure.   Eyes: Negative for pain, redness and visual disturbance.  Respiratory: Negative for cough, chest tightness and shortness of breath.   Cardiovascular: Negative for chest pain, palpitations and leg swelling.  Gastrointestinal: Positive for nausea. Negative for vomiting, abdominal pain and diarrhea.  Genitourinary: Negative for dysuria, urgency and hematuria.  Musculoskeletal: Negative for myalgias, back pain and neck pain.  Skin: Negative for rash.  Neurological: Positive for headaches. Negative for dizziness, seizures, syncope, facial asymmetry, weakness and numbness.  Hematological: Does not bruise/bleed easily.      Allergies  Ivp dye; Dolobid; Percocet; Codeine; and Codeine sulfate  Home Medications   Prior to Admission medications   Medication Sig  Start Date End Date Taking? Authorizing Provider  Calcium Carb-Cholecalciferol (CALCIUM-VITAMIN D) 500-400 MG-UNIT TABS Take 1 tablet by mouth.   Yes Historical Provider, MD  dapagliflozin propanediol (FARXIGA) 5 MG TABS tablet Take 5 mg by mouth daily. 03/12/15  Yes Sean Hommel, DO  Vitamin D, Ergocalciferol, (DRISDOL) 50000 UNITS CAPS capsule Take 1 capsule (50,000 Units total) by mouth every 7 (seven) days. 03/12/15  Yes Sean Hommel, DO   BP 126/71 mmHg  Pulse 78  Temp(Src) 97.4 F (36.3 C) (Oral)  Resp 16   Ht 5\' 3"  (1.6 m)  Wt 155 lb (70.308 kg)  BMI 27.46 kg/m2  SpO2 100% Physical Exam  Constitutional: She is oriented to person, place, and time. She appears well-developed and well-nourished. No distress.  Patient is writhing in bed and appears uncomfortable  HENT:  Head: Normocephalic and atraumatic.  Right Ear: External ear normal.  Left Ear: External ear normal.  Nose: Nose normal.  Mouth/Throat: Oropharynx is clear and moist. No oropharyngeal exudate.  Eyes: EOM are normal. Pupils are equal, round, and reactive to light.  Neck: Normal range of motion. Neck supple.  Cardiovascular: Normal rate, regular rhythm, normal heart sounds and intact distal pulses.   No murmur heard. Pulmonary/Chest: Effort normal. No respiratory distress. She has no wheezes. She has no rales.  Abdominal: Soft. She exhibits no distension. There is no tenderness.  Musculoskeletal: Normal range of motion. She exhibits no edema or tenderness.  Neurological: She is alert and oriented to person, place, and time. She has normal strength. No cranial nerve deficit or sensory deficit. She exhibits normal muscle tone. Coordination normal.  Normal finger to nose examination bilaterally  Skin: Skin is warm and dry. No rash noted. She is not diaphoretic.  Vitals reviewed.   ED Course  Procedures (including critical care time) Labs Review Labs Reviewed  BASIC METABOLIC PANEL - Abnormal; Notable for the following:    Sodium 134 (*)    Glucose, Bld 106 (*)    Anion gap 4 (*)    All other components within normal limits  CBC    Imaging Review Ct Head Wo Contrast  09/19/2015  CLINICAL DATA:  Left-sided headache. Light sensitivity. Symptoms beginning this morning. EXAM: CT HEAD WITHOUT CONTRAST TECHNIQUE: Contiguous axial images were obtained from the base of the skull through the vertex without intravenous contrast. COMPARISON:  None. FINDINGS: No acute intracranial abnormality. Specifically, no hemorrhage,  hydrocephalus, mass lesion, acute infarction, or significant intracranial injury. No acute calvarial abnormality. Visualized paranasal sinuses and mastoids clear. Orbital soft tissues unremarkable. IMPRESSION: Negative Electronically Signed   By: Rolm Baptise M.D.   On: 09/19/2015 11:10   I have personally reviewed and evaluated these images and lab results as part of my medical decision-making.   EKG Interpretation None      MDM  Patient was seen and evaluated in stable condition. Normal neurologic examination. Concerning history with regards the patient exercising at the time of onset of headache. CT obtained within hours of headache onset. CT was unremarkable. Discussed with patient and her husband that CT within 6-12 hours is 98% sensitive for subarachnoid bleeds. At this time they did not want to proceed with a lumbar puncture and I felt clinically that that was reasonable. Patient was given multiple medications including Compazine, Benadryl, Phenergan, Toradol. Her headache resolved to 1-2 on a pain scale. She requested discharge home. She was given strict return precautions and again educated on the fact that CT does not completely rule out  a subarachnoid hemorrhage. She said she would return if symptoms worsen or she develop neurologic changes. Patient was discharged home in stable condition. Final diagnoses:  Nonintractable headache, unspecified chronicity pattern, unspecified headache type    1. Headache    Harvel Quale, MD 09/19/15 1258

## 2015-09-19 NOTE — ED Notes (Signed)
D/c home with husband to drive. No new rx given

## 2015-09-19 NOTE — ED Notes (Signed)
Patient transported to CT 

## 2015-10-26 ENCOUNTER — Telehealth: Payer: Self-pay | Admitting: Family Medicine

## 2015-10-26 DIAGNOSIS — M542 Cervicalgia: Secondary | ICD-10-CM

## 2015-10-26 NOTE — Telephone Encounter (Signed)
Referral request

## 2015-10-30 ENCOUNTER — Ambulatory Visit (INDEPENDENT_AMBULATORY_CARE_PROVIDER_SITE_OTHER): Payer: 59 | Admitting: Physical Therapy

## 2015-10-30 ENCOUNTER — Encounter: Payer: Self-pay | Admitting: Physical Therapy

## 2015-10-30 DIAGNOSIS — R29898 Other symptoms and signs involving the musculoskeletal system: Secondary | ICD-10-CM | POA: Diagnosis not present

## 2015-10-30 DIAGNOSIS — M542 Cervicalgia: Secondary | ICD-10-CM

## 2015-10-30 DIAGNOSIS — M6281 Muscle weakness (generalized): Secondary | ICD-10-CM | POA: Diagnosis not present

## 2015-10-30 NOTE — Therapy (Signed)
[place] [address], [place], [address] Phone: [phone]   Fax:  [phone]  Physical Therapy Evaluation  Patient Details  Name: Wendy Anderson MRN: [id number] Date of Birth: April 01, 1970 Referring Provider: Dr Lajoyce Lauber  Encounter Date: 10/30/2015      PT End of Session - 10/30/15 0810    Visit Number 1   Number of Visits 8   Date for PT Re-Evaluation 11/27/15   PT Start Time 0810   PT Stop Time 0846   PT Time Calculation (min) 36 min   Activity Tolerance Patient tolerated treatment well      Past Medical History:  Diagnosis Date  . Migraine   . Prediabetes     Past Surgical History:  Procedure Laterality Date  . bladder tack    . BONE EXOSTOSIS EXCISION     removal from left femur(benign)  . PARTIAL HYSTERECTOMY    . removal of bone spurs     form 4th and 5th toes  . TERATOMA EXCISION     bilateral  . TOTAL HIP ARTHROPLASTY     right hip  . UMBILICAL HERNIA REPAIR      There were no vitals filed for this visit.       Subjective Assessment - 10/30/15 0810    Subjective Pt reports chronic neck problems ever since she was 45 yo.  Called them chronic migraines. She had been doing very well until she went on a trip and wasn't in her regular routine, and a month ago she was doing sit ups and then had the worse HA she has ever had. Went to ED, had CT scan and it was negative.     Pertinent History Rt shoulder issues - had TDN for this and it worked well about 2 yrs ago   Diagnostic tests CT scan was (-)    Patient Stated Goals get back to her baseline   Currently in Pain? Yes   Pain Score 5    Pain Location Head   Pain Orientation Left;Right   Pain Descriptors / Indicators Headache   Pain Type Chronic pain   Pain Onset More than a month ago   Pain Frequency Intermittent   Aggravating Factors  using computer at work, tends to draw her shoulders up   Pain Relieving Factors TENs unit, heat,  massage, accupunture mat, excederin migraine            [facility] PT Assessment - 10/30/15 0001      Assessment   Medical Diagnosis neck pain   Referring Provider Dr Lajoyce Lauber   Onset Date/Surgical Date 07/30/15   Hand Dominance Right   Next MD Visit none scheduled   Prior Therapy 2 yrs ago     Precautions   Precautions None     Balance Screen   Has the patient fallen in the past 6 months No   Has the patient had a decrease in activity level because of a fear of falling?  No   Is the patient reluctant to leave their home because of a fear of falling?  No     Home Ecologist residence     Prior Function   Level of Independence Independent   Vocation Full time employment   Vocation Requirements desk job, computer, Teacher, adult education   Leisure in grad school, gardening, reading      Observation/Other Assessments   Focus on Therapeutic Outcomes (FOTO)  21% limited  Posture/Postural Control   Posture/Postural Control Postural limitations   Postural Limitations Forward head;Increased lumbar lordosis     ROM / Strength   AROM / PROM / Strength AROM;Strength     AROM   AROM Assessment Site Shoulder;Cervical   Right/Left Shoulder --  bilat WNL, pain Rt shoulder behind back and abduction   Cervical Flexion WNL   Cervical Extension WNL   Cervical - Right Side Bend WNL   Cervical - Left Side Bend WNL   Cervical - Right Rotation WNL   Cervical - Left Rotation WNL     Strength   Strength Assessment Site Shoulder;Elbow   Right/Left Shoulder Right  Lt WNL   Right Shoulder Flexion 4+/5   Right Shoulder Extension 5/5   Right Shoulder ABduction --  5-/5   Right Shoulder Internal Rotation 5/5   Right Shoulder External Rotation 5/5   Right/Left Elbow --  bilat WNL     Palpation   Spinal mobility pain with Lt UPA mobs C 4-7 and Rt UPA mobs C6-7   Palpation comment bilat upper traps/levator,/perioccput muscular tightness, also posterior Rt shoulder  muscular tightness. Very tight in Rt pecs     Special Tests    Special Tests Cervical   Cervical Tests Spurling's     Spurling's   Findings Negative   Comment bilat sides                   [facility] Adult PT Treatment/Exercise - 10/30/15 0001      Exercises   Exercises Neck     Neck Exercises: Theraband   Shoulder External Rotation 20 reps;Green  Rt     Neck Exercises: Seated   Other Seated Exercise scap retraction     Neck Exercises: Stretches   Other Neck Stretches doorway stretch low and mid                PT Education - 10/30/15 1040    Education provided Yes   Education Details initial HEP    Person(s) Educated Patient   Methods Explanation;Demonstration   Comprehension Returned demonstration             PT Long Term Goals - 10/30/15 0806      PT LONG TERM GOAL #1   Title I with advanced HEP (11/27/15)    Time 4   Period Weeks   Status New     PT LONG TERM GOAL #2   Title report overall pain reduction in her headaches/neck to her baseline with daily activity  ( 11/27/15)    Time 4   Period Weeks   Status New     PT LONG TERM GOAL #3   Title maintain FOTO within 5 points of 22% limited (11/27/15)     Time 4   Period Weeks   Status New     PT LONG TERM GOAL #4   Title improve Rt shoulder strength to = Lt without pain ( 11/27/15)    Time 4   Period Weeks   Status New               Plan - 10/30/15 1040    Clinical Impression Statement 45 yo female with h/o cervical pain/headaches and Rt shoulder pain.  She had therapy with TDN about 2 yrs ago with good results.  She has a great understanding of her body and reactions to stressors and ways to make herself feel better.  This most recent flare up began about 2 months  ago after being on vacation out of her normal routine and then doing a sit up.  She has a home TENs unit, foam bolster and balls for trigger point release.  She would like to get back to her baseline level of discomfort  and exercise.    Rehab Potential Good   PT Frequency 2x / week   PT Duration 4 weeks   PT Treatment/Interventions Moist Heat;Traction;Therapeutic exercise;Dry needling;Taping;Manual techniques;Neuromuscular re-education;Cryotherapy;Electrical Stimulation;Patient/family education;Passive range of motion   PT Next Visit Plan TDN, manual work    Oncologist with Plan of Care Patient      Patient will benefit from skilled therapeutic intervention in order to improve the following deficits and impairments:  Postural dysfunction, Decreased strength, Pain, Increased muscle spasms  Visit Diagnosis: Cervicalgia - Plan: PT plan of care cert/re-cert  Muscle weakness (generalized) - Plan: PT plan of care cert/re-cert  Other symptoms and signs involving the musculoskeletal system - Plan: PT plan of care cert/re-cert     Problem List Patient Active Problem List   Diagnosis Date Noted  . Right shoulder pain 07/20/2015  . Chest discomfort 03/28/2015  . Essential hypertension 12/07/2014  . Hyperlipidemia 11/11/2013  . Vitamin D deficiency 04/12/2013  . Hemorrhoids 04/12/2013  . Insomnia 04/12/2013  . Degenerative disc disease, cervical 04/12/2013  . Hematuria 04/12/2013  . Status post right hip replacement 2012 [facility] 04/11/2013  . 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 Prediabetes 04/11/2013  . Tendinitis of left rotator cuff 04/11/2013  . Gluten intolerance 04/11/2013    Jeral Pinch PT  10/30/2015, 10:52 AM  [facility] [place], [place], [address] Phone: [phone]   Fax:  [phone]  Name: Wendy Anderson MRN: [id number] Date of Birth: 07-Mar-1971

## 2015-10-30 NOTE — Patient Instructions (Addendum)
Flexibility: Neck Retraction    Pull head straight back, keeping eyes and jaw level. Repeat _5-10___ times per set. Do __1__ sets per session. Do _4-5___ sessions per day.  Strengthening: Resisted External Rotation    Hold tubing in right hand, elbow at side and forearm across body. Rotate forearm out. Repeat _10___ times per set. Do _2-3___ sets per session. Do _1___ sessions per day.  Scapula Adduction With Pectorals, Low   Stand in doorframe with palms against frame and arms at 45. Lean forward and squeeze shoulder blades. Hold _30-45__ seconds. Repeat __1_ times per session. Do _1-2__ sessions per day.  Scapula Adduction With Pectorals, Mid-Range   Stand in doorframe with palms against frame and arms at 90. Lean forward and squeeze shoulder blades. Hold _30-45__ seconds. Repeat _1__ times per session. Do 1-2___ sessions per day.  Copyright  VHI. All rights reserved.

## 2015-10-31 ENCOUNTER — Ambulatory Visit: Payer: 59 | Admitting: Rehabilitative and Restorative Service Providers"

## 2015-11-02 ENCOUNTER — Ambulatory Visit (INDEPENDENT_AMBULATORY_CARE_PROVIDER_SITE_OTHER): Payer: 59 | Admitting: Physical Therapy

## 2015-11-02 ENCOUNTER — Encounter: Payer: 59 | Admitting: Physical Therapy

## 2015-11-02 DIAGNOSIS — M542 Cervicalgia: Secondary | ICD-10-CM | POA: Diagnosis not present

## 2015-11-02 DIAGNOSIS — M6281 Muscle weakness (generalized): Secondary | ICD-10-CM | POA: Diagnosis not present

## 2015-11-02 DIAGNOSIS — R29898 Other symptoms and signs involving the musculoskeletal system: Secondary | ICD-10-CM | POA: Diagnosis not present

## 2015-11-02 NOTE — Therapy (Signed)
[place], [place], [address] Phone: [phone]   Fax:  [phone]  Physical Therapy Treatment  Patient Details  Name: Wendy Anderson MRN: [id number] Date of Birth: April 30, 1970 Referring Provider: Dr. Lajoyce Lauber  Encounter Date: 11/02/2015      PT End of Session - 11/02/15 1407    Visit Number 2   Number of Visits 8   Date for PT Re-Evaluation 11/27/15   PT Start Time [id number]   PT Stop Time [id number]   PT Time Calculation (min) 44 min      Past Medical History:  Diagnosis Date  . Migraine   . Prediabetes     Past Surgical History:  Procedure Laterality Date  . bladder tack    . BONE EXOSTOSIS EXCISION     removal from left femur(benign)  . PARTIAL HYSTERECTOMY    . removal of bone spurs     form 4th and 5th toes  . TERATOMA EXCISION     bilateral  . TOTAL HIP ARTHROPLASTY     right hip  . UMBILICAL HERNIA REPAIR      There were no vitals filed for this visit.      Subjective Assessment - 11/02/15 1411    Subjective Pt reports she hasn't had pain or headache since last visit.  She has been performing HEP, but is interested in correcting posture to help keep pain at bay.    Currently in Pain? No/denies            [facility] PT Assessment - 11/02/15 0001      Assessment   Medical Diagnosis neck pain   Referring Provider Dr. Lajoyce Lauber   Onset Date/Surgical Date 07/30/15   Hand Dominance Right   Next MD Visit none scheduled   Prior Therapy 2 yrs ago          [facility] Adult PT Treatment/Exercise - 11/02/15 0001      Neck Exercises: Standing   Other Standing Exercises Doorway stretch 2 position (high position is painful on RUE) x 30 sec x 2 reps each position.      Neck Exercises: Supine   Other Supine Exercise chin tuck x 5 sec, 10 reps.  Head press x 5 sec x 5 reps.   Other Supine Exercise Hooklying on full foam roll: snow angels to ~120 deg x 8 reps, then shoulder flex x 3 reps  each arm.  thoracic lifts x 5 sec x 10 reps. Thoracic extension over bolster and foam roller at various segments of sp     Manual Therapy   Manual Therapy Soft tissue mobilization;Myofascial release   Manual therapy comments pt hooklying    Soft tissue mobilization to Rt/Lt upper trap and posterior shoulder girdle   Myofascial Release to Rt/Lt pec             PT Long Term Goals - 10/30/15 [id number]      PT LONG TERM GOAL #1   Title I with advanced HEP (11/27/15)    Time 4   Period Weeks   Status New     PT LONG TERM GOAL #2   Title report overall pain reduction in her headaches/neck to her baseline with daily activity  ( 11/27/15)    Time 4   Period Weeks   Status New     PT LONG TERM GOAL #3   Title maintain FOTO within 5 points of 22% limited (11/27/15)     Time 4  Period Weeks   Status New     PT LONG TERM GOAL #4   Title improve Rt shoulder strength to = Lt without pain ( 11/27/15)    Time 4   Period Weeks   Status New               Plan - 11/02/15 1637    Clinical Impression Statement Pt tolerated most all exercises well except any stretch with Rt shoulder above 110 deg of flexion/abdct; this increased pain.  She required tactile and VC to correct posture and form.  Pt was painfree at end of session and declined modalities.     Rehab Potential Good   PT Frequency 2x / week   PT Duration 4 weeks   PT Treatment/Interventions Moist Heat;Traction;Therapeutic exercise;Dry needling;Taping;Manual techniques;Neuromuscular re-education;Cryotherapy;Electrical Stimulation;Patient/family education;Passive range of motion   PT Next Visit Plan Progress postural strengthening. Manual/ TDN as indicated.    Consulted and Agree with Plan of Care Patient      Patient will benefit from skilled therapeutic intervention in order to improve the following deficits and impairments:  Postural dysfunction, Decreased strength, Pain, Increased muscle spasms  Visit  Diagnosis: Cervicalgia  Muscle weakness (generalized)  Other symptoms and signs involving the musculoskeletal system     Problem List Patient Active Problem List   Diagnosis Date Noted  . Right shoulder pain 07/20/2015  . Chest discomfort 03/28/2015  . Essential hypertension 12/07/2014  . Hyperlipidemia 11/11/2013  . Vitamin D deficiency 04/12/2013  . Hemorrhoids 04/12/2013  . Insomnia 04/12/2013  . Degenerative disc disease, cervical 04/12/2013  . Hematuria 04/12/2013  . Status post right hip replacement 2012 [facility] 04/11/2013  . 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 Prediabetes 04/11/2013  . Tendinitis of left rotator cuff 04/11/2013  . Gluten intolerance 04/11/2013   Kerin Perna, PTA 11/02/15 4:39 PM  [place], [place], [address] Phone: [phone]   Fax:  [phone]  Name: Wendy Anderson MRN: [id number] Date of Birth: Jan 25, 1971

## 2015-11-05 ENCOUNTER — Ambulatory Visit (INDEPENDENT_AMBULATORY_CARE_PROVIDER_SITE_OTHER): Payer: 59 | Admitting: Physical Therapy

## 2015-11-05 DIAGNOSIS — R29898 Other symptoms and signs involving the musculoskeletal system: Secondary | ICD-10-CM | POA: Diagnosis not present

## 2015-11-05 DIAGNOSIS — M6281 Muscle weakness (generalized): Secondary | ICD-10-CM | POA: Diagnosis not present

## 2015-11-05 DIAGNOSIS — M542 Cervicalgia: Secondary | ICD-10-CM | POA: Diagnosis not present

## 2015-11-05 NOTE — Patient Instructions (Addendum)
Scapular Retraction: Abduction (Prone)    Lie with upper arms straight out from sides, elbows bent to 90. Pinch shoulder blades together and raise arms a few inches from floor.  Raise forehead off of towel.  Repeat __10__ times per set. Do _2___ sets per session. Do __1__ session every other day.     Doorway  And neck stretch 2x/ day.

## 2015-11-05 NOTE — Therapy (Signed)
[place], [place], [address] Phone: [phone]   Fax:  [phone]  Physical Therapy Treatment  Patient Details  Name: Wendy Anderson MRN: [id number] Date of Birth: June 12, 1970 Referring Provider: Dr. Lajoyce Lauber  Encounter Date: 11/05/2015      PT End of Session - 11/05/15 0711    Visit Number 3   Number of Visits 8   Date for PT Re-Evaluation 11/27/15   PT Start Time 0713   PT Stop Time 0813   PT Time Calculation (min) 60 min   Activity Tolerance Patient tolerated treatment well      Past Medical History:  Diagnosis Date  . Migraine   . Prediabetes     Past Surgical History:  Procedure Laterality Date  . bladder tack    . BONE EXOSTOSIS EXCISION     removal from left femur(benign)  . PARTIAL HYSTERECTOMY    . removal of bone spurs     form 4th and 5th toes  . TERATOMA EXCISION     bilateral  . TOTAL HIP ARTHROPLASTY     right hip  . UMBILICAL HERNIA REPAIR      There were no vitals filed for this visit.      Subjective Assessment - 11/05/15 0711    Subjective Pt states she had a headache for most of Saturday, it has since resolved with stretches and Excedrine.  She noticed when doing scap squeezes at night, her shoulders tremble, "they must be really weak".     Currently in Pain? No/denies            [facility] PT Assessment - 11/05/15 0001      Assessment   Medical Diagnosis neck pain   Referring Provider Dr. Lajoyce Lauber   Onset Date/Surgical Date 07/30/15   Hand Dominance Right   Next MD Visit none scheduled   Prior Therapy 2 yrs ago          [facility] Adult PT Treatment/Exercise - 11/05/15 0001      Exercises   Exercises Shoulder;Neck     Neck Exercises: Theraband   Shoulder Extension 10 reps;Red  2 sets, standing   Rows Green;20 reps  standing     Neck Exercises: Seated   Cervical Isometrics --  chin tucks x 5 sec hold, 5 reps    Other Seated Exercise scap  retraction around pool noodle with bilat shoulder ER, red band x 10 reps, 3 sets      Neck Exercises: Prone   Axial Exentsion 5 reps   W Back 10 reps  with axial extension   Shoulder Extension 10 reps  with axial extension   Other Prone Exercise Opposite arm/leg x 10 reps each.     Other Prone Exercise POE, cervical flex to neutral.   Ardine Eng pose x 2 reps      Modalities   Modalities Moist Heat     Moist Heat Therapy   Number Minutes Moist Heat 15 Minutes   Moist Heat Location Cervical;Shoulder     Manual Therapy   Manual Therapy Soft tissue mobilization;Myofascial release   Manual therapy comments pt hooklying    Soft tissue mobilization Pin and stretch with Lt levator, Rt scalene/upper trap.     Myofascial Release suboccipital release and light manual traction.       Neck Exercises: Stretches   Other Neck Stretches doorway stretch low and mid, switched to unilateral.  30 sec x 3 reps each.  PT Long Term Goals - 11/05/15 0741      PT LONG TERM GOAL #1   Title I with advanced HEP (11/27/15)    Time 4   Period Weeks   Status On-going     PT LONG TERM GOAL #2   Title report overall pain reduction in her headaches/neck to her baseline with daily activity  ( 11/27/15)    Time 4   Period Weeks   Status On-going     PT LONG TERM GOAL #3   Title maintain FOTO within 5 points of 22% limited (11/27/15)     Time 4   Period Weeks   Status On-going     PT LONG TERM GOAL #4   Title improve Rt shoulder strength to = Lt without pain ( 11/27/15)    Time 4   Period Weeks   Status On-going               Plan - 11/05/15 1317    Clinical Impression Statement Pt tolerated all exercises well without production of pain. Pt had notable tightness in Rt upper trap and Lt levator/rhomboid, slightly reduced with manual therapy. Pt making good gains towards unmet goals.    Rehab Potential Good   PT Frequency 2x / week   PT Duration 4 weeks   PT  Treatment/Interventions Moist Heat;Traction;Therapeutic exercise;Dry needling;Taping;Manual techniques;Neuromuscular re-education;Cryotherapy;Electrical Stimulation;Patient/family education;Passive range of motion   PT Next Visit Plan Progress postural strengthening. Manual/ TDN as indicated.    Consulted and Agree with Plan of Care Patient      Patient will benefit from skilled therapeutic intervention in order to improve the following deficits and impairments:  Postural dysfunction, Decreased strength, Pain, Increased muscle spasms  Visit Diagnosis: Cervicalgia  Muscle weakness (generalized)  Other symptoms and signs involving the musculoskeletal system     Problem List Patient Active Problem List   Diagnosis Date Noted  . Right shoulder pain 07/20/2015  . Chest discomfort 03/28/2015  . Essential hypertension 12/07/2014  . Hyperlipidemia 11/11/2013  . Vitamin D deficiency 04/12/2013  . Hemorrhoids 04/12/2013  . Insomnia 04/12/2013  . Degenerative disc disease, cervical 04/12/2013  . Hematuria 04/12/2013  . Status post right hip replacement 2012 [facility] 04/11/2013  . 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 Prediabetes 04/11/2013  . Tendinitis of left rotator cuff 04/11/2013  . Gluten intolerance 04/11/2013   Kerin Perna, PTA 11/05/15 1:23 PM  [facility] [place], [place], [address] Phone: [phone]   Fax:  [phone]  Name: Ellaina Warring MRN: [id number] Date of Birth: 02/27/1971

## 2015-11-07 ENCOUNTER — Ambulatory Visit (INDEPENDENT_AMBULATORY_CARE_PROVIDER_SITE_OTHER): Payer: 59 | Admitting: Rehabilitative and Restorative Service Providers"

## 2015-11-07 ENCOUNTER — Encounter: Payer: Self-pay | Admitting: Rehabilitative and Restorative Service Providers"

## 2015-11-07 DIAGNOSIS — R29898 Other symptoms and signs involving the musculoskeletal system: Secondary | ICD-10-CM | POA: Diagnosis not present

## 2015-11-07 DIAGNOSIS — M6281 Muscle weakness (generalized): Secondary | ICD-10-CM

## 2015-11-07 DIAGNOSIS — M542 Cervicalgia: Secondary | ICD-10-CM

## 2015-11-07 NOTE — Therapy (Signed)
[place], [place], [address] Phone: [phone]   Fax:  [phone]  Physical Therapy Treatment  Patient Details  Name: Wendy Anderson MRN: [id number] Date of Birth: [birth year] Referring Provider: Dr. Lajoyce Lauber  Encounter Date: 11/07/2015      PT End of Session - 11/07/15 0716    Visit Number 4   Number of Visits 8   Date for PT Re-Evaluation 11/27/15   PT Start Time 0716   PT Stop Time 0812   PT Time Calculation (min) 56 min   Activity Tolerance Patient tolerated treatment well      Past Medical History:  Diagnosis Date  . Migraine   . Prediabetes     Past Surgical History:  Procedure Laterality Date  . bladder tack    . BONE EXOSTOSIS EXCISION     removal from left femur(benign)  . PARTIAL HYSTERECTOMY    . removal of bone spurs     form 4th and 5th toes  . TERATOMA EXCISION     bilateral  . TOTAL HIP ARTHROPLASTY     right hip  . UMBILICAL HERNIA REPAIR      There were no vitals filed for this visit.      Subjective Assessment - 11/07/15 0720    Subjective Doing better. Has only had one headache in the last few days and it did not last long. Doing her stretches at home.    Currently in Pain? No/denies                         [facility] Adult PT Treatment/Exercise - 11/07/15 0001      Neck Exercises: Theraband   Scapula Retraction 20 reps  yellow theraband   Shoulder Extension 10 reps;Red   Rows 10 reps;Red   Other Theraband Exercises mid row 10 reps red      Neck Exercises: Seated   Other Seated Exercise scap retraction around pool noodle with bilat shoulder ER, red band x 10 reps, 3 sets      Moist Heat Therapy   Number Minutes Moist Heat 10 Minutes   Moist Heat Location Cervical;Shoulder     Manual Therapy   Manual Therapy Soft tissue mobilization;Myofascial release   Manual therapy comments pt supine hooklying    Soft tissue mobilization soft  tissue work through the cervical musculature - ant/lat/post and pecs bilat    Myofascial Release manual cervical traction; suboccipital release    Passive ROM passive stretch cervical - flexion and flexioin with lslight rotation; scaleni stretch      Neck Exercises: Stretches   Other Neck Stretches doorway stretch bilat progressing to higher level to pt tolerance 30 x 3    Other Neck Stretches biceps stretch 30 sec x 3                 PT Education - 11/07/15 0758    Education provided Yes   Education Details HEP   Person(s) Educated Patient   Methods Explanation;Demonstration;Tactile cues;Verbal cues;Handout   Comprehension Verbalized understanding;Returned demonstration;Verbal cues required;Tactile cues required             PT Long Term Goals - 11/05/15 0741      PT LONG TERM GOAL #1   Title I with advanced HEP (11/27/15)    Time 4   Period Weeks   Status On-going     PT LONG TERM GOAL #2   Title report overall  pain reduction in her headaches/neck to her baseline with daily activity  ( 11/27/15)    Time 4   Period Weeks   Status On-going     PT LONG TERM GOAL #3   Title maintain FOTO within 5 points of 22% limited (11/27/15)     Time 4   Period Weeks   Status On-going     PT LONG TERM GOAL #4   Title improve Rt shoulder strength to = Lt without pain ( 11/27/15)    Time 4   Period Weeks   Status On-going               Plan - 11/07/15 [id number]    Clinical Impression Statement Responding well to treatment and HEP. Less pain and fewer headaches. Pleaed with progress. Progressing well toward stated goals of therapy.    Rehab Potential Good   PT Frequency 2x / week   PT Duration 4 weeks   PT Treatment/Interventions Moist Heat;Traction;Therapeutic exercise;Dry needling;Taping;Manual techniques;Neuromuscular re-education;Cryotherapy;Electrical Stimulation;Patient/family education;Passive range of motion   PT Next Visit Plan Progress postural strengthening.  Manual/ TDN as indicated.    Consulted and Agree with Plan of Care Patient      Patient will benefit from skilled therapeutic intervention in order to improve the following deficits and impairments:  Postural dysfunction, Decreased strength, Pain, Increased muscle spasms  Visit Diagnosis: Cervicalgia  Muscle weakness (generalized)  Other symptoms and signs involving the musculoskeletal system     Problem List Patient Active Problem List   Diagnosis Date Noted  . Right shoulder pain 07/20/2015  . Chest discomfort 03/28/2015  . Essential hypertension 12/07/2014  . Hyperlipidemia 11/11/2013  . Vitamin D deficiency 04/12/2013  . Hemorrhoids 04/12/2013  . Insomnia 04/12/2013  . Degenerative disc disease, cervical 04/12/2013  . Hematuria 04/12/2013  . Status post right hip replacement 2012 [facility] 04/11/2013  . 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 Prediabetes 04/11/2013  . Tendinitis of left rotator cuff 04/11/2013  . Gluten intolerance 04/11/2013    Wendy Anderson PT, MPH  11/07/2015, 8:02 AM  [facility] [place], [place], [address] Phone: [phone]   Fax:  [phone]  Name: Wendy Anderson MRN: [id number] Date of Birth: February 19, 1971

## 2015-11-07 NOTE — Patient Instructions (Signed)
ELBOW: Biceps - Standing    Standing in doorway, place one hand on wall, elbow straight. Lean forward. Hold __30_ seconds. _3__ reps per set 1-2 times a day   Row: Mid-Range - Standing    With yellow band anchored at chest level, pull elbows backward, squeezing shoulder blades together. Keep head and spine neutral. Row 10-20___ times, __1_ times per day.

## 2015-11-14 ENCOUNTER — Ambulatory Visit (INDEPENDENT_AMBULATORY_CARE_PROVIDER_SITE_OTHER): Payer: 59 | Admitting: Physical Therapy

## 2015-11-14 DIAGNOSIS — R29898 Other symptoms and signs involving the musculoskeletal system: Secondary | ICD-10-CM

## 2015-11-14 DIAGNOSIS — M542 Cervicalgia: Secondary | ICD-10-CM | POA: Diagnosis not present

## 2015-11-14 DIAGNOSIS — M6281 Muscle weakness (generalized): Secondary | ICD-10-CM | POA: Diagnosis not present

## 2015-11-14 NOTE — Therapy (Addendum)
[place], [place], [address] Phone: [phone]   Fax:  [phone]  Physical Therapy Treatment  Patient Details  Name: Wendy Anderson MRN: [id number] Date of Birth: 12-24-1970 Referring Provider: Dr. Lajoyce Lauber  Encounter Date: 11/14/2015      PT End of Session - 11/14/15 0719    Visit Number 5   Number of Visits 8   Date for PT Re-Evaluation 11/27/15   PT Start Time 0715   PT Stop Time 0815   PT Time Calculation (min) 60 min   Activity Tolerance Patient tolerated treatment well      Past Medical History:  Diagnosis Date  . Migraine   . Prediabetes     Past Surgical History:  Procedure Laterality Date  . bladder tack    . BONE EXOSTOSIS EXCISION     removal from left femur(benign)  . PARTIAL HYSTERECTOMY    . removal of bone spurs     form 4th and 5th toes  . TERATOMA EXCISION     bilateral  . TOTAL HIP ARTHROPLASTY     right hip  . UMBILICAL HERNIA REPAIR      There were no vitals filed for this visit.      Subjective Assessment - 11/14/15 0719    Subjective Pt was out of town for 5 nights.  Slept wrong and had increased tightness and pain in Lt neck increased to 5/10.  No headaches over last 2 wks.  She performed stretches and scap squeezes while away.  Used ice to calm neck down.    Currently in Pain? No/denies            [facility] PT Assessment - 11/14/15 0001      Assessment   Medical Diagnosis neck pain   Referring Provider Dr. Lajoyce Lauber   Onset Date/Surgical Date 07/30/15   Hand Dominance Right   Next MD Visit none scheduled   Prior Therapy 2 yrs ago     Strength   Right/Left Shoulder Right   Right Shoulder Flexion --  5-/5   Right Shoulder Extension 5/5   Right Shoulder ABduction 5/5   Right Shoulder Internal Rotation 5/5   Right Shoulder External Rotation --  5-/5     Palpation   Patella mobility Pain with CPA C6-T2. Lt UPA C6,7 painful.     Per Letta Kocher, PT            [facility] Adult PT Treatment/Exercise - 11/14/15 0001      Neck Exercises: Theraband   Shoulder Extension 15 reps;Red  bilat UE   Rows 10 reps;Red  2 sets   Rows Limitations mid row for 8 reps.    Shoulder External Rotation 20 reps  yellow band   Shoulder External Rotation Limitations (standing, with scap squeeze)    Other Theraband Exercises scaption with yellow band x 10 reps (to 90 deg) each arm. (standing)      Shoulder Exercises: ROM/Strengthening   UBE (Upper Arm Bike) L2: 1.5 min each direction.      Moist Heat Therapy   Number Minutes Moist Heat 15 Minutes   Moist Heat Location Cervical  thoracic     Manual Therapy   Manual Therapy Joint mobilization;Passive ROM   Joint Mobilization Rt C6-C7 UPA on articular pilar PA mobs grade III+;  C7 CPA grade III  Performed by Jeral Pinch, PT   Myofascial Release --   Passive ROM passive cervical Rt lateral flexion,  Rt rotation      Neck Exercises: Stretches   Upper Trapezius Stretch 4 reps;20 seconds   Other Neck Stretches Doorway stretch 3 positions x 2 reps each, 30 sec each.    Other Neck Stretches biceps stretch 30 sec x 3        spinal mobs provided by supervising PT Jeral Pinch, PT 11/14/15 2:06 PM                PT Long Term Goals - 11/05/15 0741      PT LONG TERM GOAL #1   Title I with advanced HEP (11/27/15)    Time 4   Period Weeks   Status On-going     PT LONG TERM GOAL #2   Title report overall pain reduction in her headaches/neck to her baseline with daily activity  ( 11/27/15)    Time 4   Period Weeks   Status On-going     PT LONG TERM GOAL #3   Title maintain FOTO within 5 points of 22% limited (11/27/15)     Time 4   Period Weeks   Status On-going     PT LONG TERM GOAL #4   Title improve Rt shoulder strength to = Lt without pain ( 11/27/15)    Time 4   Period Weeks   Status On-going               Plan - 11/14/15 [id number]    Clinical Impression  Statement Pt presented with limited Rt lateral flexion upon arrival, with pain (during motion). Pt reported decreased pain and improved mobility after manual therapy.  Pt tolerated all exercises well, noting less discomfort with doorway stretch.Improved shoulder strength; slightly weaker than LUE in flexion and ER.  Pt progressing well towards unmet goals.    Rehab Potential Good   PT Frequency 2x / week   PT Duration 4 weeks   PT Treatment/Interventions Moist Heat;Traction;Therapeutic exercise;Dry needling;Taping;Manual techniques;Neuromuscular re-education;Cryotherapy;Electrical Stimulation;Patient/family education;Passive range of motion   PT Next Visit Plan Passive ROM, continue progressive postural strengthening (prone position)    Consulted and Agree with Plan of Care Patient      Patient will benefit from skilled therapeutic intervention in order to improve the following deficits and impairments:  Postural dysfunction, Decreased strength, Pain, Increased muscle spasms  Visit Diagnosis: Cervicalgia  Muscle weakness (generalized)  Other symptoms and signs involving the musculoskeletal system     Problem List Patient Active Problem List   Diagnosis Date Noted  . Right shoulder pain 07/20/2015  . Chest discomfort 03/28/2015  . Essential hypertension 12/07/2014  . Hyperlipidemia 11/11/2013  . Vitamin D deficiency 04/12/2013  . Hemorrhoids 04/12/2013  . Insomnia 04/12/2013  . Degenerative disc disease, cervical 04/12/2013  . Hematuria 04/12/2013  . Status post right hip replacement 2012 [facility] 04/11/2013  . 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 Prediabetes 04/11/2013  . Tendinitis of left rotator cuff 04/11/2013  . Gluten intolerance 04/11/2013   Kerin Perna, PTA 11/14/15 8:25 AM  Jeral Pinch, PT 11/14/15 2:06 PM  [place], [place], [address] Phone: [phone]   Fax:   [phone]  Name: Nonna Kutney MRN: [id number] Date of Birth: 1970-12-06

## 2015-11-16 ENCOUNTER — Encounter: Payer: 59 | Admitting: Physical Therapy

## 2015-11-19 ENCOUNTER — Ambulatory Visit (INDEPENDENT_AMBULATORY_CARE_PROVIDER_SITE_OTHER): Payer: 59 | Admitting: Physical Therapy

## 2015-11-19 DIAGNOSIS — M542 Cervicalgia: Secondary | ICD-10-CM

## 2015-11-19 DIAGNOSIS — R29898 Other symptoms and signs involving the musculoskeletal system: Secondary | ICD-10-CM

## 2015-11-19 DIAGNOSIS — M6281 Muscle weakness (generalized): Secondary | ICD-10-CM | POA: Diagnosis not present

## 2015-11-19 NOTE — Patient Instructions (Signed)
Strengthening: Resisted Flexion    Hold tubing with left arm at side. Pull forward and up. Move shoulder through pain-free range of motion. Repeat __10__ times per set. Do _2___ sets per session. Do _3___ sessions per week.Strengthening: Resisted Abduction    Hold tubing with left arm across body. Pull up and away from side. Move through pain-free range of motion. Repeat __10__ times per set. Do _2___ sets per session. Do __3__ sessions per week.   [facility] [place], [place] [address]  [phone] (office) [phone] (fax)

## 2015-11-19 NOTE — Therapy (Signed)
[place], [place], [address] Phone: [phone]   Fax:  [phone]  Physical Therapy Treatment  Patient Details  Name: Wendy Anderson MRN: [id number] Date of Birth: 11-20-1970 Referring Provider: Dr. Lajoyce Lauber  Encounter Date: 11/19/2015      PT End of Session - 11/19/15 0806    Visit Number 6   Number of Visits 8   Date for PT Re-Evaluation 11/27/15   PT Start Time 0802   PT Stop Time [id number]   PT Time Calculation (min) 42 min      Past Medical History:  Diagnosis Date  . Migraine   . Prediabetes     Past Surgical History:  Procedure Laterality Date  . bladder tack    . BONE EXOSTOSIS EXCISION     removal from left femur(benign)  . PARTIAL HYSTERECTOMY    . removal of bone spurs     form 4th and 5th toes  . TERATOMA EXCISION     bilateral  . TOTAL HIP ARTHROPLASTY     right hip  . UMBILICAL HERNIA REPAIR      There were no vitals filed for this visit.      Subjective Assessment - 11/19/15 0806    Subjective Pt reports she was sore for 3 days after last session with joint mobs.  She felt like she was going to get headache, but did not get one.  She has been consistent with scap squeeze, doorway stretch, head presses, but not any of the band exercises.    Currently in Pain? No/denies  just stiff.             [facility] PT Assessment - 11/19/15 0001      Assessment   Medical Diagnosis neck pain   Referring Provider Dr. Lajoyce Lauber   Onset Date/Surgical Date 07/30/15   Hand Dominance Right   Next MD Visit none scheduled   Prior Therapy 2 yrs ago     AROM   Cervical Flexion 65   Cervical Extension 45   Cervical - Right Side Bend 35   Cervical - Left Side Bend 35   Cervical - Right Rotation 75   Cervical - Left Rotation 80     Strength   Strength Assessment Site Shoulder   Right/Left Shoulder Right;Left   Right Shoulder Flexion 5/5   Right Shoulder Extension 5/5   Right  Shoulder ABduction 5/5   Right Shoulder Internal Rotation 5/5   Right Shoulder External Rotation 5/5   Left Shoulder Flexion 4/5   Left Shoulder Extension 5/5   Left Shoulder ABduction 4/5   Left Shoulder Internal Rotation --  5-/5   Left Shoulder External Rotation --  5-/5                     [facility] Adult PT Treatment/Exercise - 11/19/15 0001      Neck Exercises: Prone   W Back 10 reps  with axial extension   Shoulder Extension 10 reps  with axial extension     Shoulder Exercises: Standing   External Rotation 20 reps;Theraband;Both   Flexion Left;10 reps;Theraband   ABduction Left;10 reps;Theraband     Modalities   Modalities --  pt declined, had another appt to leave for.      Manual Therapy   Soft tissue mobilization Soft tissue work through upper trap, cervical paraspinals.    Myofascial Release suboccipital release.    Passive ROM passive cervical Lt/Rt  lateral flexion, Lt/Rt rotation      Neck Exercises: Stretches   Upper Trapezius Stretch 2 reps;60 seconds   Lower Cervical/Upper Thoracic Stretch --  spine roll down/up x 5 reps   Other Neck Stretches Doorway stretch 3 positions x 2 reps each, 30 sec each.    Other Neck Stretches biceps stretch 30 sec x 3; cervical diagonals and cervical flexion x 5 reps,                      PT Long Term Goals - 11/05/15 0741      PT LONG TERM GOAL #1   Title I with advanced HEP (11/27/15)    Time 4   Period Weeks   Status On-going     PT LONG TERM GOAL #2   Title report overall pain reduction in her headaches/neck to her baseline with daily activity  ( 11/27/15)    Time 4   Period Weeks   Status On-going     PT LONG TERM GOAL #3   Title maintain FOTO within 5 points of 22% limited (11/27/15)     Time 4   Period Weeks   Status On-going     PT LONG TERM GOAL #4   Title improve Rt shoulder strength to = Lt without pain ( 11/27/15)    Time 4   Period Weeks   Status On-going                Plan - 11/19/15 1312    Clinical Impression Statement Pt tolerated all exercises well, without increase in symptoms.  Pt tested weak in her Lt shoulder; Rt shoulder strength much improved and without pain.  Pt reported decreased tightness in neck with manual therapy, however one "catch" in Lt cervical area remained at end of session.  Pt declined modalities as she had to leave for another appt; will do at home.     Rehab Potential Good   PT Frequency 2x / week   PT Duration 4 weeks   PT Treatment/Interventions Moist Heat;Traction;Therapeutic exercise;Dry needling;Taping;Manual techniques;Neuromuscular re-education;Cryotherapy;Electrical Stimulation;Patient/family education;Passive range of motion   PT Next Visit Plan Passive ROM, manual therapy for pec and cervical muscles.  continue progressive postural strengthening and shoulder strengthening.    Consulted and Agree with Plan of Care Patient      Patient will benefit from skilled therapeutic intervention in order to improve the following deficits and impairments:  Postural dysfunction, Decreased strength, Pain, Increased muscle spasms  Visit Diagnosis: Cervicalgia  Muscle weakness (generalized)  Other symptoms and signs involving the musculoskeletal system     Problem List Patient Active Problem List   Diagnosis Date Noted  . Right shoulder pain 07/20/2015  . Chest discomfort 03/28/2015  . Essential hypertension 12/07/2014  . Hyperlipidemia 11/11/2013  . Vitamin D deficiency 04/12/2013  . Hemorrhoids 04/12/2013  . Insomnia 04/12/2013  . Degenerative disc disease, cervical 04/12/2013  . Hematuria 04/12/2013  . Status post right hip replacement 2012 [facility] 04/11/2013  . 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 Prediabetes 04/11/2013  . Tendinitis of left rotator cuff 04/11/2013  . Gluten intolerance 04/11/2013   Kerin Perna, PTA 11/19/15 1:16 PM  [place] [place]  [place], [place], [address] Phone: [phone]   Fax:  [phone]  Name: Wendy Anderson MRN: [id number] Date of Birth: 10-Jun-1970

## 2015-11-21 ENCOUNTER — Ambulatory Visit (INDEPENDENT_AMBULATORY_CARE_PROVIDER_SITE_OTHER): Payer: 59 | Admitting: Physical Therapy

## 2015-11-21 DIAGNOSIS — R29898 Other symptoms and signs involving the musculoskeletal system: Secondary | ICD-10-CM

## 2015-11-21 DIAGNOSIS — M542 Cervicalgia: Secondary | ICD-10-CM | POA: Diagnosis not present

## 2015-11-21 DIAGNOSIS — M6281 Muscle weakness (generalized): Secondary | ICD-10-CM | POA: Diagnosis not present

## 2015-11-21 NOTE — Therapy (Signed)
[place], [place], [address] Phone: [phone]   Fax:  [phone]  Physical Therapy Treatment  Patient Details  Name: Wendy Anderson MRN: [id number] Date of Birth: 27-Mar-1970 Referring Provider: Dr. Lajoyce Lauber  Encounter Date: 11/21/2015      PT End of Session - 11/21/15 0720    Visit Number 7   Number of Visits 8   Date for PT Re-Evaluation 11/27/15   PT Start Time 0715   PT Stop Time 0800   PT Time Calculation (min) 45 min   Activity Tolerance Patient tolerated treatment well;No increased pain      Past Medical History:  Diagnosis Date  . Migraine   . Prediabetes     Past Surgical History:  Procedure Laterality Date  . bladder tack    . BONE EXOSTOSIS EXCISION     removal from left femur(benign)  . PARTIAL HYSTERECTOMY    . removal of bone spurs     form 4th and 5th toes  . TERATOMA EXCISION     bilateral  . TOTAL HIP ARTHROPLASTY     right hip  . UMBILICAL HERNIA REPAIR      There were no vitals filed for this visit.          [facility] PT Assessment - 11/21/15 0001      AROM   Cervical - Right Side Bend 30  40 deg  after manual therapy   Cervical - Left Side Bend 32  45 deg after manual therapy            [facility] Adult PT Treatment/Exercise - 11/21/15 0001      Manual Therapy   Manual Therapy Myofascial release;Passive ROM;Taping   Soft tissue mobilization pin and stretch to Rt/Lt scalene.   Edge tool assistance to bilat upper trap, cervical paraspinals to decrease fascial restrictions and improve ROM.    Myofascial Release suboccipital release; Rt / Lt platysma and anterior sling (Rt tighter than Lt);  Rt/ Lt scalene; Rt / Lt rhomboid and mid trap   Passive ROM Passive cervical rotation and lateral flexion.    Kinesiotex Inhibit Muscle     Kinesiotix   Inhibit Muscle  Y strip applied to Lt / Rt upper trap to inhibit muscle.  Lift strip applied perpendicular to Lt  Y strip to decompress tissue and decrease pain.       Neck Exercises: Stretches   Upper Trapezius Stretch 2 reps;60 seconds   Other Neck Stretches Doorway stretch 3 positions x 2 reps each, 30 sec each.       Pt declined modalities; will perform at home.         PT Long Term Goals - 11/05/15 0741      PT LONG TERM GOAL #1   Title I with advanced HEP (11/27/15)    Time 4   Period Weeks   Status On-going     PT LONG TERM GOAL #2   Title report overall pain reduction in her headaches/neck to her baseline with daily activity  ( 11/27/15)    Time 4   Period Weeks   Status On-going     PT LONG TERM GOAL #3   Title maintain FOTO within 5 points of 22% limited (11/27/15)     Time 4   Period Weeks   Status On-going     PT LONG TERM GOAL #4   Title improve Rt shoulder strength to = Lt without pain ( 11/27/15)  Time 4   Period Weeks   Status On-going               Plan - 11/21/15 [id number]    Clinical Impression Statement  Notable tightness throughout Rt anterior / lateral neck fascia, Rt pec maj/minor, and Rt spiral line of fascia. (Pt stated she felt fascial pull in abdominals and stated she had abdominal surgery in past).  Pt demonstrated improved lateral neck flexion after manual therapy. Pt's sleep position / pillow may be contributing factor to tightness.   Pt making gradual progress towards remaining goals.    Rehab Potential Good   PT Frequency 2x / week   PT Duration 4 weeks   PT Next Visit Plan Assess response to [facility] tape application.  TDN if indicated.  Progressive shoulder/ postural strengthening.    Consulted and Agree with Plan of Care Patient      Patient will benefit from skilled therapeutic intervention in order to improve the following deficits and impairments:  Postural dysfunction, Decreased strength, Pain, Increased muscle spasms  Visit Diagnosis: Cervicalgia  Muscle weakness (generalized)  Other symptoms and signs involving the musculoskeletal  system     Problem List Patient Active Problem List   Diagnosis Date Noted  . Right shoulder pain 07/20/2015  . Chest discomfort 03/28/2015  . Essential hypertension 12/07/2014  . Hyperlipidemia 11/11/2013  . Vitamin D deficiency 04/12/2013  . Hemorrhoids 04/12/2013  . Insomnia 04/12/2013  . Degenerative disc disease, cervical 04/12/2013  . Hematuria 04/12/2013  . Status post right hip replacement 2012 [facility] 04/11/2013  . 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 Prediabetes 04/11/2013  . Tendinitis of left rotator cuff 04/11/2013  . Gluten intolerance 04/11/2013   Kerin Perna, PTA 11/21/15 8:23 AM  [place], [place], [address] Phone: [phone]   Fax:  [phone]  Name: Wendy Anderson MRN: [id number] Date of Birth: 01-24-1971

## 2015-11-28 ENCOUNTER — Ambulatory Visit (INDEPENDENT_AMBULATORY_CARE_PROVIDER_SITE_OTHER): Payer: 59 | Admitting: Physical Therapy

## 2015-11-28 DIAGNOSIS — M6281 Muscle weakness (generalized): Secondary | ICD-10-CM

## 2015-11-28 DIAGNOSIS — M542 Cervicalgia: Secondary | ICD-10-CM

## 2015-11-28 DIAGNOSIS — R29898 Other symptoms and signs involving the musculoskeletal system: Secondary | ICD-10-CM

## 2015-11-28 NOTE — Therapy (Signed)
[place], [place], [address] Phone: [phone]   Fax:  [phone]  Physical Therapy Treatment  Patient Details  Name: Wendy Anderson MRN: [id number] Date of Birth: [birth year] Referring Provider: Dr. Ileene Rubens  Encounter Date: 11/28/2015      PT End of Session - 11/28/15 0730    Visit Number 8   Number of Visits 20   Date for PT Re-Evaluation 01/09/16   PT Start Time 0715   PT Stop Time 0820   PT Time Calculation (min) 65 min   Activity Tolerance Patient tolerated treatment well;No increased pain      Past Medical History:  Diagnosis Date  . Migraine   . Prediabetes     Past Surgical History:  Procedure Laterality Date  . bladder tack    . BONE EXOSTOSIS EXCISION     removal from left femur(benign)  . PARTIAL HYSTERECTOMY    . removal of bone spurs     form 4th and 5th toes  . TERATOMA EXCISION     bilateral  . TOTAL HIP ARTHROPLASTY     right hip  . UMBILICAL HERNIA REPAIR      There were no vitals filed for this visit.      Subjective Assessment - 11/28/15 0750    Subjective Pt reports she awoke to phone in middle of night and wasn't able to fall back to sleep. Yesterday was stressful and she has a headache on Lt side today.  She has been foam rolling upper back, using ball for self massage, with some relief.  She has also been completing HEP and notices the strength in both arms has improved.  She reports she had relief from the [place] tape for the 3 days it lasted .    Patient Stated Goals get back to her baseline   Currently in Pain? Yes   Pain Score 4    Pain Location Head   Pain Orientation Left   Pain Descriptors / Indicators Headache   Aggravating Factors  stress, using computer at work.    Pain Relieving Factors massage, heat, medicine            [facility] PT Assessment - 11/28/15 0001      Assessment   Medical Diagnosis neck pain    Referring Provider Dr. Ileene Rubens    Onset Date/Surgical Date 07/30/15   Hand Dominance Right   Next MD Visit none scheduled   Prior Therapy 2 yrs ago     Observation/Other Assessments   Focus on Therapeutic Outcomes (FOTO)  17% limitation     AROM   Cervical - Right Side Bend 33   Cervical - Left Side Bend 45     Strength   Right Shoulder Flexion 5/5   Right Shoulder Extension 5/5   Right Shoulder ABduction 5/5   Right Shoulder Internal Rotation 5/5   Right Shoulder External Rotation 5/5   Left Shoulder Flexion --  5-/5   Left Shoulder Extension 5/5   Left Shoulder ABduction --  5-/5   Left Shoulder Internal Rotation 5/5   Left Shoulder External Rotation --  5-/5                     [facility] Adult PT Treatment/Exercise - 11/28/15 0001      Neck Exercises: Machines for Strengthening   UBE (Upper Arm Bike) L1: 1.5 min each direction, standing.      Neck Exercises: Supine   Other  Supine Exercise Supine stretch over red therapy ball with arms in T shape ( to stretch ant fascia and pecs) x 1 min    Other Supine Exercise suboccipital release with massage blocks, repeated release to upper thoracic spine x 30 sec to 4 segments.      Modalities   Modalities Electrical Stimulation;Moist Heat     Moist Heat Therapy   Number Minutes Moist Heat 15 Minutes   Moist Heat Location Cervical     Electrical Stimulation   Electrical Stimulation Location bilat cervical paraspinals   Electrical Stimulation Action IFC   Electrical Stimulation Parameters to tolerance    Electrical Stimulation Goals Pain;Tone     Manual Therapy   Manual Therapy Myofascial release;Soft tissue mobilization;Passive ROM   Manual therapy comments pt prone for TDN and manual work    Soft tissue mobilization pin and stretch to Rt/Lt scalene.      Myofascial Release suboccipital release; Rt platysma.    Passive ROM Passive cervical rotation and lateral flexion.    [place];Inhibit Muscle     Kinesiotix   Inhibit Muscle   Y strip applied to Lt / Rt upper trap to inhibit muscle.  Lift strip applied perpendicular to Lt Y strip to decompress tissue and decrease pain.       Neck Exercises: Stretches   Upper Trapezius Stretch 2 reps;60 seconds   Other Neck Stretches Doorway stretch 3 positions x 2 reps each, 30 sec each.    Other Neck Stretches bicep stretch x 30 sec each side.           Trigger Point Dry Needling - 11/28/15 1253    Consent Given? Yes   Education Handout Provided Yes   Muscles Treated Upper Body Upper trapezius;Levator scapulae;Longissimus   Upper Trapezius Response Palpable increased muscle length   Levator Scapulae Response Palpable increased muscle length   Longissimus Response Palpable increased muscle length              PT Education - 11/28/15 1254    Education provided Yes   Education Details TDN; HEP    Person(s) Educated Patient   Methods Explanation   Comprehension Verbalized understanding             PT Long Term Goals - 11/28/15 0754      PT LONG TERM GOAL #1   Title I with advanced HEP (01/09/16)    Time 10   Period Weeks   Status Revised     PT LONG TERM GOAL #2   Title report overall pain reduction in her headaches/neck to her baseline with daily activity  ( 01/09/16)    Time 10   Period Weeks   Status Revised     PT LONG TERM GOAL #3   Title maintain FOTO within 5 points of 22% limited (01/09/16)     Time 10   Period Weeks   Status Revised     PT LONG TERM GOAL #4   Title improve Lt shoulder strength to = Rt without pain (01/09/16)    Time 10   Period Weeks   Status Revised               Plan - 11/28/15 0803    Clinical Impression Statement Notable tightness throughout Lt/Rt posterior neck musculature; Pt had decreased tightness to palpation through the posterior cervical musculature and upper traps with TDN to this area.  Pt has met LTG # 4 with improved Rt shoulder strength; making good progress  towards remaining goals. Pt will  benefit from additional therapy to decrease head/neck tightness and pain.    Rehab Potential Good   PT Frequency 2x / week   PT Duration 4 weeks   PT Treatment/Interventions Moist Heat;Traction;Therapeutic exercise;Dry needling;Taping;Manual techniques;Neuromuscular re-education;Cryotherapy;Electrical Stimulation;Patient/family education;Passive range of motion   PT Next Visit Plan Assess response to TDN.  continue TDN, manual as indicated.  Spoke to supervising PT; she will send renewal for additional visits.    Consulted and Agree with Plan of Care Patient      Patient will benefit from skilled therapeutic intervention in order to improve the following deficits and impairments:  Postural dysfunction, Decreased strength, Pain, Increased muscle spasms  Visit Diagnosis: Cervicalgia - Plan: PT plan of care cert/re-cert  Muscle weakness (generalized) - Plan: PT plan of care cert/re-cert  Other symptoms and signs involving the musculoskeletal system - Plan: PT plan of care cert/re-cert     Problem List Patient Active Problem List   Diagnosis Date Noted  . Right shoulder pain 07/20/2015  . Chest discomfort 03/28/2015  . Essential hypertension 12/07/2014  . Hyperlipidemia 11/11/2013  . Vitamin D deficiency 04/12/2013  . Hemorrhoids 04/12/2013  . Insomnia 04/12/2013  . Degenerative disc disease, cervical 04/12/2013  . Hematuria 04/12/2013  . Status post right hip replacement 2012 [facility] 04/11/2013  . 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 Prediabetes 04/11/2013  . Tendinitis of left rotator cuff 04/11/2013  . Gluten intolerance 04/11/2013    Callista Hoh Nilda Simmer PT, MPH  11/28/2015, 1:03 PM    [facility] [place], [place], [address] Phone: [phone]   Fax:  [phone]  Name: Paislee Szatkowski MRN: [id number] Date of Birth: November 22, 1970

## 2015-11-28 NOTE — Therapy (Signed)
[place], [place], [address] Phone: [phone]   Fax:  [phone]  Physical Therapy Treatment  Patient Details  Name: Wendy Anderson MRN: [id number] Date of Birth: 02-Mar-1971 Referring Provider: Dr. Ileene Rubens  Encounter Date: 11/28/2015      PT End of Session - 11/28/15 0730    Visit Number 8   Number of Visits 8   PT Start Time 0715   PT Stop Time 0820   PT Time Calculation (min) 65 min   Activity Tolerance Patient tolerated treatment well;No increased pain      Past Medical History:  Diagnosis Date  . Migraine   . Prediabetes     Past Surgical History:  Procedure Laterality Date  . bladder tack    . BONE EXOSTOSIS EXCISION     removal from left femur(benign)  . PARTIAL HYSTERECTOMY    . removal of bone spurs     form 4th and 5th toes  . TERATOMA EXCISION     bilateral  . TOTAL HIP ARTHROPLASTY     right hip  . UMBILICAL HERNIA REPAIR      There were no vitals filed for this visit.      Subjective Assessment - 11/28/15 0750    Subjective Pt reports she awoke to phone in middle of night and wasn't able to fall back to sleep. Yesterday was stressful and she has a headache on Lt side today.  She has been foam rolling upper back, using ball for self massage, with some relief.  She has also been completing HEP and notices the strength in both arms has improved.  She reports she had relief from the [place] tape for the 3 days it lasted .    Patient Stated Goals get back to her baseline   Currently in Pain? Yes   Pain Score 4    Pain Location Head   Pain Orientation Left   Pain Descriptors / Indicators Headache   Aggravating Factors  stress, using computer at work.    Pain Relieving Factors massage, heat, medicine            [facility] PT Assessment - 11/28/15 0001      Assessment   Medical Diagnosis neck pain    Referring Provider Dr. Ileene Rubens   Onset Date/Surgical Date 07/30/15   Hand Dominance Right   Next MD Visit none scheduled   Prior Therapy 2 yrs ago     Observation/Other Assessments   Focus on Therapeutic Outcomes (FOTO)  17% limitation     AROM   Cervical - Right Side Bend 33   Cervical - Left Side Bend 45     Strength   Right Shoulder Flexion 5/5   Right Shoulder Extension 5/5   Right Shoulder ABduction 5/5   Right Shoulder Internal Rotation 5/5   Right Shoulder External Rotation 5/5   Left Shoulder Flexion --  5-/5   Left Shoulder Extension 5/5   Left Shoulder ABduction --  5-/5   Left Shoulder Internal Rotation 5/5   Left Shoulder External Rotation --  5-/5                     [facility] Adult PT Treatment/Exercise - 11/28/15 0001      Neck Exercises: Machines for Strengthening   UBE (Upper Arm Bike) L1: 1.5 min each direction, standing.      Neck Exercises: Supine   Other Supine Exercise Supine stretch over red therapy ball  with arms in T shape ( to stretch ant fascia and pecs) x 1 min    Other Supine Exercise suboccipital release with massage blocks, repeated release to upper thoracic spine x 30 sec to 4 segments.      Modalities   Modalities Electrical Stimulation;Moist Heat     Moist Heat Therapy   Number Minutes Moist Heat 15 Minutes   Moist Heat Location Cervical     Electrical Stimulation   Electrical Stimulation Location bilat cervical paraspinals   Electrical Stimulation Action IFC   Electrical Stimulation Parameters to tolerance    Electrical Stimulation Goals Pain;Tone     Manual Therapy   Manual Therapy Myofascial release;Soft tissue mobilization;Passive ROM   Soft tissue mobilization pin and stretch to Rt/Lt scalene.      Myofascial Release suboccipital release; Rt platysma.    Passive ROM Passive cervical rotation and lateral flexion.    [place];Inhibit Muscle     Kinesiotix   Inhibit Muscle  Y strip applied to Lt / Rt upper trap to inhibit muscle.  Lift strip applied perpendicular to Lt  Y strip to decompress tissue and decrease pain.       Neck Exercises: Stretches   Upper Trapezius Stretch 2 reps;60 seconds   Other Neck Stretches Doorway stretch 3 positions x 2 reps each, 30 sec each.    Other Neck Stretches bicep stretch x 30 sec each side.             PT Long Term Goals - 11/28/15 0754      PT LONG TERM GOAL #1   Title I with advanced HEP (11/27/15)    Time 4   Period Weeks   Status On-going     PT LONG TERM GOAL #2   Title report overall pain reduction in her headaches/neck to her baseline with daily activity  ( 11/27/15)    Time 4   Period Weeks   Status On-going     PT LONG TERM GOAL #3   Title maintain FOTO within 5 points of 22% limited (11/27/15)     Time 4   Period Weeks   Status Achieved     PT LONG TERM GOAL #4   Title improve Rt shoulder strength to = Lt without pain ( 11/27/15)    Time 4   Period Weeks   Status Achieved               Plan - 11/28/15 0803    Clinical Impression Statement Notable tightness throughout Lt/Rt posterior neck musculature; Pt had positive twitch response to TDN to this area.  Pt has met LTG # 4 with improved Rt shoulder strength; making good progress towards remaining goals. Pt will benefit from additional therapy to decrease head/neck tightness and pain.    Rehab Potential Good   PT Frequency 2x / week   PT Duration 4 weeks   PT Treatment/Interventions Moist Heat;Traction;Therapeutic exercise;Dry needling;Taping;Manual techniques;Neuromuscular re-education;Cryotherapy;Electrical Stimulation;Patient/family education;Passive range of motion   PT Next Visit Plan Assess response to TDN.  continue TDN, manual as indicated.  Spoke to supervising PT; she will send renewal for additional visits.    Consulted and Agree with Plan of Care Patient      Patient will benefit from skilled therapeutic intervention in order to improve the following deficits and impairments:  Postural dysfunction, Decreased strength, Pain,  Increased muscle spasms  Visit Diagnosis: Cervicalgia  Muscle weakness (generalized)  Other symptoms and signs involving the musculoskeletal system  Problem List Patient Active Problem List   Diagnosis Date Noted  . Right shoulder pain 07/20/2015  . Chest discomfort 03/28/2015  . Essential hypertension 12/07/2014  . Hyperlipidemia 11/11/2013  . Vitamin D deficiency 04/12/2013  . Hemorrhoids 04/12/2013  . Insomnia 04/12/2013  . Degenerative disc disease, cervical 04/12/2013  . Hematuria 04/12/2013  . Status post right hip replacement 2012 [facility] 04/11/2013  . 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 Prediabetes 04/11/2013  . Tendinitis of left rotator cuff 04/11/2013  . Gluten intolerance 04/11/2013   Kerin Perna, PTA 11/28/15 8:47 AM  [facility] [place] [place], [place], [address] Phone: [phone]   Fax:  [phone]  Name: Temica Righetti MRN: [id number] Date of Birth: 01-May-1970

## 2015-11-28 NOTE — Patient Instructions (Signed)

## 2015-11-30 ENCOUNTER — Ambulatory Visit (INDEPENDENT_AMBULATORY_CARE_PROVIDER_SITE_OTHER): Payer: 59 | Admitting: Rehabilitative and Restorative Service Providers"

## 2015-11-30 ENCOUNTER — Encounter: Payer: Self-pay | Admitting: Rehabilitative and Restorative Service Providers"

## 2015-11-30 DIAGNOSIS — M6281 Muscle weakness (generalized): Secondary | ICD-10-CM

## 2015-11-30 DIAGNOSIS — R29898 Other symptoms and signs involving the musculoskeletal system: Secondary | ICD-10-CM | POA: Diagnosis not present

## 2015-11-30 DIAGNOSIS — M542 Cervicalgia: Secondary | ICD-10-CM

## 2015-11-30 NOTE — Therapy (Signed)
[place], [place], [address] Phone: [phone]   Fax:  [phone]  Physical Therapy Treatment  Patient Details  Name: Wendy Anderson MRN: [id number] Date of Birth: [birth year] Referring Provider: Dr. Ileene Rubens  Encounter Date: 11/30/2015      PT End of Session - 11/30/15 1455    Visit Number 9   Number of Visits 20   Date for PT Re-Evaluation 01/09/16   PT Start Time [id number]   PT Stop Time 1549   PT Time Calculation (min) 58 min   Activity Tolerance Patient tolerated treatment well      Past Medical History:  Diagnosis Date  . Migraine   . Prediabetes     Past Surgical History:  Procedure Laterality Date  . bladder tack    . BONE EXOSTOSIS EXCISION     removal from left femur(benign)  . PARTIAL HYSTERECTOMY    . removal of bone spurs     form 4th and 5th toes  . TERATOMA EXCISION     bilateral  . TOTAL HIP ARTHROPLASTY     right hip  . UMBILICAL HERNIA REPAIR      There were no vitals filed for this visit.      Subjective Assessment - 11/30/15 1456    Subjective Some improvement following last visit with TDN and manual work but intensity of headache has increased again now. She can't get rid of the headache. Headaches over the past 2 years may typically last 3 days. There have been fewer of the 3 day headaches since beginning therapy.    Currently in Pain? Yes   Pain Score 4    Pain Orientation Left   Pain Descriptors / Indicators Headache   Pain Type Chronic pain   Pain Onset More than a month ago   Pain Frequency Intermittent                         [facility] Adult PT Treatment/Exercise - 11/30/15 0001      Neck Exercises: Machines for Strengthening   UBE (Upper Arm Bike) L2 x 4 min alt fwd/back     Neck Exercises: Supine   Neck Retraction 5 reps;10 secs     Shoulder Exercises: Stretch   Other Shoulder Stretches --     Ultrasound   Ultrasound Location  bilat upper traps to lateral cervical musculature    Ultrasound Parameters 100%; 1.5 w/cm2; 1 mHz; 8 min    Ultrasound Goals Pain;Other (Comment)  tightness     Manual Therapy   Manual Therapy Myofascial release;Soft tissue mobilization;Passive ROM   Manual therapy comments pt supine    Joint Mobilization CPA/UPA mobs lower to mid cervical    Soft tissue mobilization deep tissue work through the posterior cervical and upper trap/leveator areas Rt > Lt    Myofascial Release Rt upper trap/lateral cervical    Passive ROM flexion and flexion with slight rotation in flexion; bilat lateral flexion; Lt lateral flexion with counterpressure through the Rt upper shoulder      Neck Exercises: Stretches   Upper Trapezius Stretch 2 reps;60 seconds   Other Neck Stretches Doorway stretch 3 positions x 2 reps each, 30 sec each.    Other Neck Stretches bicep stretch x 30 sec each side.                      PT Long Term Goals - 11/28/15 [id number]  PT LONG TERM GOAL #1   Title I with advanced HEP (01/09/16)    Time 10   Period Weeks   Status Revised     PT LONG TERM GOAL #2   Title report overall pain reduction in her headaches/neck to her baseline with daily activity  ( 01/09/16)    Time 10   Period Weeks   Status Revised     PT LONG TERM GOAL #3   Title maintain FOTO within 5 points of 22% limited (01/09/16)     Time 10   Period Weeks   Status Revised     PT LONG TERM GOAL #4   Title improve Lt shoulder strength to = Rt without pain (01/09/16)    Time 10   Period Weeks   Status Revised               Plan - 11/30/15 1544    Clinical Impression Statement persistent tightness noted through Rt > Lt upper trap/leveator; pecs; ant/lat/post cervical musculature. Good response to [place] and manual work today with reduction of HA from 4/10 to 1/10.    Rehab Potential Good   PT Frequency 2x / week   PT Duration 4 weeks   PT Treatment/Interventions Moist  Heat;Traction;Therapeutic exercise;Dry needling;Taping;Manual techniques;Neuromuscular re-education;Cryotherapy;Electrical Stimulation;Patient/family education;Passive range of motion   PT Next Visit Plan Continue TDN v manual as indicated.    Consulted and Agree with Plan of Care Patient      Patient will benefit from skilled therapeutic intervention in order to improve the following deficits and impairments:  Postural dysfunction, Decreased strength, Pain, Increased muscle spasms  Visit Diagnosis: Cervicalgia  Muscle weakness (generalized)  Other symptoms and signs involving the musculoskeletal system     Problem List Patient Active Problem List   Diagnosis Date Noted  . Right shoulder pain 07/20/2015  . Chest discomfort 03/28/2015  . Essential hypertension 12/07/2014  . Hyperlipidemia 11/11/2013  . Vitamin D deficiency 04/12/2013  . Hemorrhoids 04/12/2013  . Insomnia 04/12/2013  . Degenerative disc disease, cervical 04/12/2013  . Hematuria 04/12/2013  . Status post right hip replacement 2012 [facility] 04/11/2013  . 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 Prediabetes 04/11/2013  . Tendinitis of left rotator cuff 04/11/2013  . Gluten intolerance 04/11/2013    Yulissa Needham Nilda Simmer PT, MPH  11/30/2015, 3:47 PM  [facility] [address] [place], [place], [address] Phone: [phone]   Fax:  [phone]  Name: Wendy Anderson MRN: [id number] Date of Birth: May 13, 1970

## 2015-12-07 ENCOUNTER — Ambulatory Visit (INDEPENDENT_AMBULATORY_CARE_PROVIDER_SITE_OTHER): Payer: 59 | Admitting: Physical Therapy

## 2015-12-07 DIAGNOSIS — R29898 Other symptoms and signs involving the musculoskeletal system: Secondary | ICD-10-CM | POA: Diagnosis not present

## 2015-12-07 DIAGNOSIS — M542 Cervicalgia: Secondary | ICD-10-CM | POA: Diagnosis not present

## 2015-12-07 NOTE — Therapy (Signed)
[place], [place], [address] Phone: [phone]   Fax:  [phone]  Physical Therapy Treatment  Patient Details  Name: Wendy Anderson MRN: [id number] Date of Birth: 1971/01/28 Referring Provider: Dr. Ileene Rubens  Encounter Date: 12/07/2015      PT End of Session - 12/07/15 1518    Visit Number 10   Number of Visits 20   PT Start Time 2831   PT Stop Time 5176  pt had to leave early for a mtg   PT Time Calculation (min) 30 min   Activity Tolerance Patient tolerated treatment well      Past Medical History:  Diagnosis Date  . Migraine   . Prediabetes     Past Surgical History:  Procedure Laterality Date  . bladder tack    . BONE EXOSTOSIS EXCISION     removal from left femur(benign)  . PARTIAL HYSTERECTOMY    . removal of bone spurs     form 4th and 5th toes  . TERATOMA EXCISION     bilateral  . TOTAL HIP ARTHROPLASTY     right hip  . UMBILICAL HERNIA REPAIR      There were no vitals filed for this visit.      Subjective Assessment - 12/07/15 1452    Subjective DeeDee said this has been a terrible week with work, good for her neck,  she thinks she is about ready to be finished with therapy.  First thing in AM she is very tight so she takes a muscle relaxer vefore bend and it seems to be helping. No headaches   Patient Stated Goals get back to her baseline   Currently in Pain? No/denies                         [facility] Adult PT Treatment/Exercise - 12/07/15 0001      Self-Care   Self-Care Other Self-Care Comments   Other Self-Care Comments  discussed HEP, sleep positions, pillow usage for preventative      Manual Therapy   Manual Therapy Soft tissue mobilization;Manual Traction   Manual therapy comments pt supine    Joint Mobilization gentle cervical rotational mobs   Soft tissue mobilization occipital base release, upper trap stretching.    Manual Traction  cervical in supine                PT Education - 12/07/15 1502    Education provided Yes   Education Details self mobs   Person(s) Educated Patient   Methods Demonstration;Handout   Comprehension Returned demonstration;Verbalized understanding             PT Long Term Goals - 12/07/15 1457      PT LONG TERM GOAL #1   Title I with advanced HEP (01/09/16)    Status Achieved     PT LONG TERM GOAL #2   Title report overall pain reduction in her headaches/neck to her baseline with daily activity  ( 01/09/16)    Status Achieved     PT LONG TERM GOAL #3   Title maintain FOTO within 5 points of 22% limited (01/09/16)     Status --  not assessed     PT LONG TERM GOAL #4   Title improve Lt shoulder strength to = Rt without pain (01/09/16)    Status Achieved             Patient will benefit from skilled therapeutic  intervention in order to improve the following deficits and impairments:     Visit Diagnosis: Cervicalgia  Other symptoms and signs involving the musculoskeletal system     Problem List Patient Active Problem List   Diagnosis Date Noted  . Right shoulder pain 07/20/2015  . Chest discomfort 03/28/2015  . Essential hypertension 12/07/2014  . Hyperlipidemia 11/11/2013  . Vitamin D deficiency 04/12/2013  . Hemorrhoids 04/12/2013  . Insomnia 04/12/2013  . Degenerative disc disease, cervical 04/12/2013  . Hematuria 04/12/2013  . Status post right hip replacement 2012 [facility] 04/11/2013  . 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 Prediabetes 04/11/2013  . Tendinitis of left rotator cuff 04/11/2013  . Gluten intolerance 04/11/2013    Jeral Pinch PT  12/07/2015, 3:21 PM  [facility] [place], [place], [address] Phone: [phone]   Fax:  [phone]  Name: Lasya Vetter MRN: [id number] Date of Birth: [birth year]   PHYSICAL THERAPY DISCHARGE SUMMARY  Visits from Start of Care:  10  Current functional level related to goals / functional outcomes: See above   Remaining deficits: She is close to her baseline   Education / Equipment: HEP Plan: Patient agrees to discharge.  Patient goals were partially met. Patient is being discharged due to being pleased with the current functional level.  ?????    Jeral Pinch, PT 12/07/15 3:22 PM

## 2015-12-07 NOTE — Patient Instructions (Addendum)
Upper Cervical Rotation Mobilization    With right hand firmly supporting neck, and border of hand at first palpable vertebra in neck, slowly rotate head toward arm. Repeat ____ times per set. Do ____ sets per session. Do ____ sessions per day. Gradually increase speed as coordination improves.  http://orth.exer.us/370   Copyright  VHI. All rights reserved.

## 2015-12-12 ENCOUNTER — Encounter: Payer: 59 | Admitting: Physical Therapy

## 2016-01-22 ENCOUNTER — Telehealth: Payer: Self-pay | Admitting: Physician Assistant

## 2016-01-22 DIAGNOSIS — R7303 Prediabetes: Secondary | ICD-10-CM

## 2016-01-22 DIAGNOSIS — E559 Vitamin D deficiency, unspecified: Secondary | ICD-10-CM

## 2016-01-22 DIAGNOSIS — E785 Hyperlipidemia, unspecified: Secondary | ICD-10-CM

## 2016-01-22 DIAGNOSIS — I10 Essential (primary) hypertension: Secondary | ICD-10-CM

## 2016-01-22 NOTE — Telephone Encounter (Signed)
Contact patient b/c she wants to have blood work done first so I assume that you would make the call on that one

## 2016-01-22 NOTE — Telephone Encounter (Signed)
Call pt: we usually do lipid and cmp for regular labs occasionally would do vitamin D level. Confirm patient is not wanting any other screenings. Confirm she does not need Hep C.

## 2016-01-22 NOTE — Telephone Encounter (Signed)
Labs ordered. Pt notified.

## 2016-02-05 ENCOUNTER — Ambulatory Visit: Payer: 59 | Admitting: Physician Assistant

## 2016-02-27 ENCOUNTER — Ambulatory Visit (INDEPENDENT_AMBULATORY_CARE_PROVIDER_SITE_OTHER): Payer: 59 | Admitting: Physician Assistant

## 2016-02-27 ENCOUNTER — Encounter: Payer: Self-pay | Admitting: Physician Assistant

## 2016-02-27 VITALS — BP 120/82 | HR 80 | Ht 63.0 in | Wt 170.0 lb

## 2016-02-27 DIAGNOSIS — F5101 Primary insomnia: Secondary | ICD-10-CM | POA: Diagnosis not present

## 2016-02-27 DIAGNOSIS — F439 Reaction to severe stress, unspecified: Secondary | ICD-10-CM | POA: Diagnosis not present

## 2016-02-27 DIAGNOSIS — F411 Generalized anxiety disorder: Secondary | ICD-10-CM

## 2016-02-27 DIAGNOSIS — R7303 Prediabetes: Secondary | ICD-10-CM

## 2016-02-27 MED ORDER — ESZOPICLONE 1 MG PO TABS: 1.0000 mg | ORAL_TABLET | Freq: Every evening | ORAL | 2 refills | Status: DC | PRN

## 2016-02-27 NOTE — Progress Notes (Signed)
   Subjective:    Patient ID: Wendy Anderson, female    DOB: 1971/01/09, 45 y.o.   MRN: [id number]  HPI  Pt is a 45 yo female who presents to the clinic to establish care after PCP left clinic.   .. Active Ambulatory Problems    Diagnosis Date Noted  . Status post right hip replacement 2012 [facility] 04/11/2013  . 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 Prediabetes 04/11/2013  . Tendinitis of left rotator cuff 04/11/2013  . Gluten intolerance 04/11/2013  . Vitamin D deficiency 04/12/2013  . Hemorrhoids 04/12/2013  . Insomnia 04/12/2013  . Degenerative disc disease, cervical 04/12/2013  . Hematuria 04/12/2013  . Hyperlipidemia 11/11/2013  . Essential hypertension 12/07/2014  . Chest discomfort 03/28/2015  . Right shoulder pain 07/20/2015   Resolved Ambulatory Problems    Diagnosis Date Noted  . No Resolved Ambulatory Problems   Past Medical History:  Diagnosis Date  . Migraine   . Prediabetes    .Marland Kitchen Family History  Problem Relation Age of Onset  . Lung cancer Mother     deceased at age 43  . Heart attack Maternal Grandfather   . Diabetes Paternal Grandfather   . Hyperlipidemia Paternal Grandfather   . Neurologic Disorder Father     Primary Progressive Aphasia  . Hypertension Paternal Grandfather   . Stroke Neg Hx    .Marland Kitchen Social History   Social History  . Marital status: Married    Spouse name: N/A  . Number of children: N/A  . Years of education: N/A   Occupational History  . Not on file.   Social History Main Topics  . Smoking status: Never Smoker  . Smokeless tobacco: Never Used  . Alcohol use Yes     Comment: socially  . Drug use: No  . Sexual activity: Yes    Partners: Male   Other Topics Concern  . Not on file   Social History Narrative  . No narrative on file   She wants to discuss Last a1c 5.9 4 children nathan LSD  Trazodone lunesta   Review of Systems  All other systems reviewed and are negative.      Objective:   Physical Exam   Constitutional: She is oriented to person, place, and time. She appears well-developed and well-nourished.  HENT:  Head: Normocephalic and atraumatic.  Cardiovascular: Normal rate, regular rhythm and normal heart sounds.   Pulmonary/Chest: Effort normal and breath sounds normal.  Neurological: She is alert and oriented to person, place, and time.  Psychiatric: She has a normal mood and affect. Her behavior is normal.          Assessment & Plan:  Marland KitchenMarland KitchenDiagnoses and all orders for this visit:  Stress at home  Anxiety state  Primary insomnia -     eszopiclone (LUNESTA) 1 MG TABS tablet; Take 1 tablet (1 mg total) by mouth at bedtime as needed for sleep. Take immediately before bedtime  Prediabetes   Discussed counseling and exercise. Pt reports she will make appt with counselor.  Discussed SSRI therapy she declined today and would like to work on sleep and other ways to manage stress.  lunesta given but can consider melatonin as well.   A!C ordered in labs.  Follow up in 3 months.

## 2016-02-28 ENCOUNTER — Encounter: Payer: Self-pay | Admitting: Physician Assistant

## 2016-02-28 LAB — COMPLETE METABOLIC PANEL WITH GFR
ALT: 18 U/L (ref 6–29)
AST: 17 U/L (ref 10–35)
Albumin: 4.2 g/dL (ref 3.6–5.1)
Alkaline Phosphatase: 49 U/L (ref 33–115)
BUN: 10 mg/dL (ref 7–25)
CHLORIDE: 103 mmol/L (ref 98–110)
CO2: 29 mmol/L (ref 20–31)
Calcium: 9.1 mg/dL (ref 8.6–10.2)
Creat: 0.68 mg/dL (ref 0.50–1.10)
GFR, Est African American: >89 mL/min (ref >=60)
GLUCOSE: 89 mg/dL (ref 65–99)
POTASSIUM: 4.2 mmol/L (ref 3.5–5.3)
SODIUM: 137 mmol/L (ref 135–146)
Total Bilirubin: 0.4 mg/dL (ref 0.2–1.2)
Total Protein: 6.8 g/dL (ref 6.1–8.1)

## 2016-02-28 LAB — LIPID PANEL
CHOL/HDL RATIO: 3.7 ratio (ref <5.0)
Cholesterol: 227 mg/dL — ABNORMAL HIGH (ref <200)
HDL: 61 mg/dL (ref >50)
LDL CALC: 127 mg/dL — AB (ref <100)
Triglycerides: 197 mg/dL — ABNORMAL HIGH (ref <150)
VLDL: 39 mg/dL — AB (ref <30)

## 2016-02-28 LAB — VITAMIN D 25 HYDROXY (VIT D DEFICIENCY, FRACTURES): Vit D, 25-Hydroxy: 22 ng/mL — ABNORMAL LOW (ref 30–100)

## 2016-02-28 LAB — HEMOGLOBIN A1C
Hgb A1c MFr Bld: 5.7 % — ABNORMAL HIGH (ref <5.7)
MEAN PLASMA GLUCOSE: 117 mg/dL

## 2016-04-25 ENCOUNTER — Ambulatory Visit (INDEPENDENT_AMBULATORY_CARE_PROVIDER_SITE_OTHER): Payer: 59 | Admitting: Physician Assistant

## 2016-04-25 ENCOUNTER — Encounter: Payer: Self-pay | Admitting: Physician Assistant

## 2016-04-25 VITALS — BP 124/87 | HR 93 | Ht 63.0 in | Wt 172.0 lb

## 2016-04-25 DIAGNOSIS — F5101 Primary insomnia: Secondary | ICD-10-CM | POA: Diagnosis not present

## 2016-04-25 DIAGNOSIS — L738 Other specified follicular disorders: Secondary | ICD-10-CM | POA: Diagnosis not present

## 2016-04-25 DIAGNOSIS — R059 Cough, unspecified: Secondary | ICD-10-CM

## 2016-04-25 DIAGNOSIS — R058 Other specified cough: Secondary | ICD-10-CM

## 2016-04-25 DIAGNOSIS — R05 Cough: Secondary | ICD-10-CM

## 2016-04-25 MED ORDER — METHYLPREDNISOLONE SODIUM SUCC 125 MG IJ SOLR
125.0000 mg | Freq: Once | INTRAMUSCULAR | Status: AC
  Administered 2016-04-25: 125 mg via INTRAMUSCULAR

## 2016-04-25 MED ORDER — TRAZODONE HCL 50 MG PO TABS: 25.0000 mg | ORAL_TABLET | Freq: Every evening | ORAL | 3 refills | Status: DC | PRN

## 2016-04-25 MED ORDER — VITAMIN D (ERGOCALCIFEROL) 1.25 MG (50000 UNIT) PO CAPS: 50000.0000 [IU] | ORAL_CAPSULE | ORAL | 0 refills | Status: DC

## 2016-04-25 NOTE — Progress Notes (Signed)
   Subjective:    Patient ID: Wendy Anderson, female    DOB: 10-17-1970, 46 y.o.   MRN: [id number]  HPI  Pt is a 46 yo female who presents to the clinic with URI that started 6 weeks ago. All symptoms except cough have resolved. Cough is productive at times but mostly dry. No fever, chills, body aches, wheezing. She does hear some "rattling" before a cough at times. She took OTC cold medicine when she first got sick 6 weeks ago but taking nothing today.   She would like seb hyperplasia of forehead frozen today.   She would like refill of trazodone for sleep.   Review of Systems  All other systems reviewed and are negative.      Objective:   Physical Exam  Constitutional: She is oriented to person, place, and time. She appears well-developed and well-nourished.  HENT:  Head: Normocephalic and atraumatic.  Right Ear: External ear normal.  Left Ear: External ear normal.  Nose: Nose normal.  Mouth/Throat: Oropharynx is clear and moist. No oropharyngeal exudate.  Eyes: Conjunctivae are normal.  Neck: Normal range of motion. Neck supple.  Cardiovascular: Normal rate, regular rhythm and normal heart sounds.   Pulmonary/Chest: Effort normal and breath sounds normal. She has no wheezes.  Lymphadenopathy:    She has no cervical adenopathy.  Neurological: She is alert and oriented to person, place, and time.  Skin:  Flesh colored glossy papules of forehead multiple.   Psychiatric: She has a normal mood and affect. Her behavior is normal.          Assessment & Plan:  Marland KitchenMarland KitchenDackeri was seen today for cough.  Diagnoses and all orders for this visit:  Post-viral cough syndrome -     methylPREDNISolone sodium succinate (SOLU-MEDROL) 125 mg/2 mL injection 125 mg; Inject 2 mLs (125 mg total) into the muscle once.  Cough -     methylPREDNISolone sodium succinate (SOLU-MEDROL) 125 mg/2 mL injection 125 mg; Inject 2 mLs (125 mg total) into the muscle once.  Primary insomnia -     traZODone  (DESYREL) 50 MG tablet; Take 0.5-1 tablets (25-50 mg total) by mouth at bedtime as needed for sleep.  Sebaceous hyperplasia  Other orders -     Vitamin D, Ergocalciferol, (DRISDOL) 50000 units CAPS capsule; Take 1 capsule (50,000 Units total) by mouth every 7 (seven) days.   Reassurance given for cough. If not improving in the next week then come in for CXR.  Solumedrol 125mg  given in office today.   Cryotherapy Procedure Note  Pre-operative Diagnosis: sebaceous hyperplasia   Post-operative Diagnosis: same  Locations: forehead  Indications: irritation    Procedure Details  History of allergy to iodine: no. Pacemaker? no.  Patient informed of risks (permanent scarring, infection, light or dark discoloration, bleeding, infection, weakness, numbness and recurrence of the lesion) and benefits of the procedure and verbal informed consent obtained.  The areas are treated with liquid nitrogen therapy, frozen until ice ball extended 1 mm beyond lesion, allowed to thaw, and treated again. The patient tolerated procedure well.  The patient was instructed on post-op care, warned that there may be blister formation, redness and pain. Recommend OTC analgesia as needed for pain.  Condition: Stable  Complications: none.  Plan: 1. Instructed to keep the area dry and covered for 24-48h and clean thereafter. 2. Warning signs of infection were reviewed.   3. Recommended that the patient use OTC acetaminophen as needed for pain.

## 2016-04-28 ENCOUNTER — Encounter: Payer: Self-pay | Admitting: Physician Assistant

## 2016-04-28 DIAGNOSIS — L738 Other specified follicular disorders: Secondary | ICD-10-CM | POA: Insufficient documentation

## 2016-04-29 ENCOUNTER — Ambulatory Visit: Payer: 59 | Admitting: Physician Assistant

## 2016-05-09 ENCOUNTER — Encounter: Payer: Self-pay | Admitting: Physician Assistant

## 2016-05-09 ENCOUNTER — Ambulatory Visit (INDEPENDENT_AMBULATORY_CARE_PROVIDER_SITE_OTHER): Payer: 59 | Admitting: Physician Assistant

## 2016-05-09 ENCOUNTER — Ambulatory Visit (INDEPENDENT_AMBULATORY_CARE_PROVIDER_SITE_OTHER): Payer: 59

## 2016-05-09 VITALS — BP 133/77 | HR 94 | Ht 63.0 in | Wt 174.0 lb

## 2016-05-09 DIAGNOSIS — Z1231 Encounter for screening mammogram for malignant neoplasm of breast: Secondary | ICD-10-CM | POA: Diagnosis not present

## 2016-05-09 DIAGNOSIS — Z801 Family history of malignant neoplasm of trachea, bronchus and lung: Secondary | ICD-10-CM | POA: Diagnosis not present

## 2016-05-09 DIAGNOSIS — R05 Cough: Secondary | ICD-10-CM

## 2016-05-09 DIAGNOSIS — R059 Cough, unspecified: Secondary | ICD-10-CM

## 2016-05-09 NOTE — Patient Instructions (Addendum)
Cough, Adult Coughing is a reflex that clears your throat and your airways. Coughing helps to heal and protect your lungs. It is normal to cough occasionally, but a cough that happens with other symptoms or lasts a long time may be a sign of a condition that needs treatment. A cough may last only 2-3 weeks (acute), or it may last longer than 8 weeks (chronic). What are the causes? Coughing is commonly caused by:  Breathing in substances that irritate your lungs.  A viral or bacterial respiratory infection.  Allergies.  Asthma.  Postnasal drip.  Smoking.  Acid backing up from the stomach into the esophagus (gastroesophageal reflux).  Certain medicines.  Chronic lung problems, including COPD (or rarely, lung cancer).  Other medical conditions such as heart failure. Follow these instructions at home: Pay attention to any changes in your symptoms. Take these actions to help with your discomfort:  Take medicines only as told by your health care provider.  If you were prescribed an antibiotic medicine, take it as told by your health care provider. Do not stop taking the antibiotic even if you start to feel better.  Talk with your health care provider before you take a cough suppressant medicine.  Drink enough fluid to keep your urine clear or pale yellow.  If the air is dry, use a cold steam vaporizer or humidifier in your bedroom or your home to help loosen secretions.  Avoid anything that causes you to cough at work or at home.  If your cough is worse at night, try sleeping in a semi-upright position.  Avoid cigarette smoke. If you smoke, quit smoking. If you need help quitting, ask your health care provider.  Avoid caffeine.  Avoid alcohol.  Rest as needed. Contact a health care provider if:  You have new symptoms.  You cough up pus.  Your cough does not get better after 2-3 weeks, or your cough gets worse.  You cannot control your cough with suppressant medicines  and you are losing sleep.  You develop pain that is getting worse or pain that is not controlled with pain medicines.  You have a fever.  You have unexplained weight loss.  You have night sweats. Get help right away if:  You cough up blood.  You have difficulty breathing.  Your heartbeat is very fast. This information is not intended to replace advice given to you by your health care provider. Make sure you discuss any questions you have with your health care provider. Document Released: 09/06/2010 Document Revised: 08/16/2015 Document Reviewed: 05/17/2014 Elsevier Interactive Patient Education  2017 [place]   Upper airway cough syndrome.

## 2016-05-09 NOTE — Progress Notes (Signed)
Call pt: CXR is normal.

## 2016-05-12 ENCOUNTER — Telehealth: Payer: Self-pay | Admitting: Physician Assistant

## 2016-05-12 NOTE — Progress Notes (Signed)
   Subjective:    Patient ID: Wendy Anderson, female    DOB: 1970-11-11, 46 y.o.   MRN: [id number]  HPI Pt presents to the clinic with cough since before christmas. Solumedrol shot did not help. She admits to some acid reflux symptoms. Cough is dry. No fever, chills, sinus pressure, ear pain, wheezing or SOB. She feels find except for cough. No hx of allergies. Mother died of lung cancer in her 23's. She was exposed to 2nd hand smoke.    Review of Systems  All other systems reviewed and are negative.      Objective:   Physical Exam  Constitutional: She is oriented to person, place, and time. She appears well-developed and well-nourished.  HENT:  Head: Normocephalic and atraumatic.  Right Ear: External ear normal.  Left Ear: External ear normal.  Nose: Nose normal.  Mouth/Throat: Oropharynx is clear and moist. No oropharyngeal exudate.  Eyes: Conjunctivae are normal.  Neck: Normal range of motion. Neck supple.  Cardiovascular: Normal rate, regular rhythm and normal heart sounds.   Pulmonary/Chest: Effort normal and breath sounds normal. She has no wheezes. She has no rales. She exhibits no tenderness.  Abdominal: Soft. Bowel sounds are normal. She exhibits no distension and no mass. There is no tenderness. There is no rebound and no guarding.  Lymphadenopathy:    She has no cervical adenopathy.  Neurological: She is alert and oriented to person, place, and time.  Psychiatric: She has a normal mood and affect. Her behavior is normal.          Assessment & Plan:  Marland KitchenMarland KitchenDiagnoses and all orders for this visit:  Cough -     DG Chest 2 View  Family hx of lung cancer -     DG Chest 2 View  Visit for screening mammogram -     MM DIGITAL SCREENING BILATERAL  upper airway cough syndrome. Will get CXR.  Will start with zantac 150mg  twice a day for 10 days.  Then add singulair and if still no improvement will add ICS inhaler.  HO given.

## 2016-05-12 NOTE — Telephone Encounter (Signed)
Call pt: will you confirm that she is going to try zantac 150mg  twice a day for 10 days to see if helps with cough?  If no improvement can also try singulair at bedtime.

## 2016-05-23 ENCOUNTER — Ambulatory Visit (INDEPENDENT_AMBULATORY_CARE_PROVIDER_SITE_OTHER): Payer: 59

## 2016-05-23 DIAGNOSIS — Z1231 Encounter for screening mammogram for malignant neoplasm of breast: Secondary | ICD-10-CM

## 2016-06-27 NOTE — Telephone Encounter (Signed)
Called Pt to do a follow up. Pt states she is not taking Zantac at this time. Pt states she started taking Zyrtec daily and this has helped with our cough "tremendously." No further questions/concerns.

## 2016-12-02 ENCOUNTER — Encounter: Payer: Self-pay | Admitting: Physician Assistant

## 2016-12-02 ENCOUNTER — Ambulatory Visit (INDEPENDENT_AMBULATORY_CARE_PROVIDER_SITE_OTHER): Payer: 59 | Admitting: Physician Assistant

## 2016-12-02 VITALS — BP 130/84 | HR 74 | Wt 163.0 lb

## 2016-12-02 DIAGNOSIS — M5020 Other cervical disc displacement, unspecified cervical region: Secondary | ICD-10-CM | POA: Insufficient documentation

## 2016-12-02 DIAGNOSIS — M5030 Other cervical disc degeneration, unspecified cervical region: Secondary | ICD-10-CM

## 2016-12-02 MED ORDER — METHOCARBAMOL 500 MG PO TABS: 500.0000 mg | ORAL_TABLET | Freq: Three times a day (TID) | ORAL | 0 refills | Status: DC

## 2016-12-02 MED ORDER — PREDNISONE 50 MG PO TABS: ORAL_TABLET | ORAL | 0 refills | Status: DC

## 2016-12-02 NOTE — Patient Instructions (Signed)
Cervical Radiculopathy  Cervical radiculopathy means that a nerve in the neck is pinched or bruised. This can cause pain or loss of feeling (numbness) that runs from your neck to your arm and fingers.  Follow these instructions at home:  Managing pain  ? Take over-the-counter and prescription medicines only as told by your doctor.  ? If directed, put ice on the injured or painful area.  ? Put ice in a plastic bag.  ? Place a towel between your skin and the bag.  ? Leave the ice on for 20 minutes, 2?3 times per day.  ? If ice does not help, you can try using heat. Take a warm shower or warm bath, or use a heat pack as told by your doctor.  ? You may try a gentle neck and shoulder massage.  Activity  ? Rest as needed. Follow instructions from your doctor about any activities to avoid.  ? Do exercises as told by your doctor or physical therapist.  General instructions  ? If you were given a soft collar, wear it as told by your doctor.  ? Use a flat pillow when you sleep.  ? Keep all follow-up visits as told by your doctor. This is important.  Contact a doctor if:  ? Your condition does not improve with treatment.  Get help right away if:  ? Your pain gets worse and is not controlled with medicine.  ? You lose feeling or feel weak in your hand, arm, face, or leg.  ? You have a fever.  ? You have a stiff neck.  ? You cannot control when you poop or pee (have incontinence).  ? You have trouble with walking, balance, or talking.  This information is not intended to replace advice given to you by your health care provider. Make sure you discuss any questions you have with your health care provider.  Document Released: 02/27/2011 Document Revised: 08/16/2015 Document Reviewed: 05/04/2014  Elsevier Interactive Patient Education ? 2018 Elsevier Inc[employer]

## 2016-12-02 NOTE — Progress Notes (Signed)
   Subjective:    Patient ID: Wendy Anderson, female    DOB: 05-Jan-1971, 46 y.o.   MRN: [id number]  HPI  Pt is a 46 yo female with hx of cervical disc hernation with confirmation 2015 with MRI at C5/C6. She comes in today with worsening neck pain, daily headaches, feeling like she does not have as much hand strength and night time numbness and tingling of bilateral extremities. At night her bilateral arm goes numb off and on. She is at the computer all day long. She went through a round of PT last august which helped a lot. For the last several months her pain continues to worsen. She is exercising and losing weight. Down 10lbs from a few months ago. She does take an occasional aleve.   . Active Ambulatory Problems    Diagnosis Date Noted  . Status post right hip replacement 2012 [facility] 04/11/2013  . 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 Prediabetes 04/11/2013  . Tendinitis of left rotator cuff 04/11/2013  . Gluten intolerance 04/11/2013  . Vitamin D insufficiency 04/12/2013  . Hemorrhoids 04/12/2013  . Insomnia 04/12/2013  . Degenerative disc disease, cervical 04/12/2013  . Hematuria 04/12/2013  . Hyperlipidemia 11/11/2013  . Essential hypertension 12/07/2014  . Chest discomfort 03/28/2015  . Right shoulder pain 07/20/2015  . Stress at home 02/27/2016  . Anxiety state 02/27/2016  . 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 Cervical herniated disc 12/02/2016   Resolved Ambulatory Problems    Diagnosis Date Noted  . No Resolved Ambulatory Problems   Past Medical History:  Diagnosis Date  . Migraine   . Prediabetes        Review of Systems See HPI.     Objective:   Physical Exam  Constitutional: She appears well-developed and well-nourished.  HENT:  Head: Normocephalic and atraumatic.  Cardiovascular: Normal rate, regular rhythm and normal heart sounds.   Pulmonary/Chest: Effort normal and breath sounds normal.  Musculoskeletal:  Tenderness over C5/6.  Cervical Paraspinous  tightness.   Upper ext 5/5.  Hand grip 5/5.   Negative phalens and tinels bilaterally.   Normal ROM of neck and bilateral shoulders.   Psychiatric: She has a normal mood and affect. Her behavior is normal.          Assessment & Plan:  Marland KitchenMarland KitchenDackeri was seen today for neck pain.  Diagnoses and all orders for this visit:  Cervical herniated disc -     predniSONE (DELTASONE) 50 MG tablet; Take one tablet for 5 days. -     methocarbamol (ROBAXIN) 500 MG tablet; Take 1 tablet (500 mg total) by mouth 3 (three) times daily. -     Ambulatory referral to Physical Therapy  Degenerative disc disease, cervical -     predniSONE (DELTASONE) 50 MG tablet; Take one tablet for 5 days. -     methocarbamol (ROBAXIN) 500 MG tablet; Take 1 tablet (500 mg total) by mouth 3 (three) times daily. -     Ambulatory referral to Physical Therapy   Start PT. Prednisone burst given. Robaxin to use as needed. Aleve for anti inflammatory benefits. Biofreeze/heat/tens unit.  Consider regular massages for neck.  Discussed posture. Wrote note for her to get a stand up desk that is ergonomic.  If no improvement or worsening will get imgaing.

## 2016-12-09 ENCOUNTER — Encounter: Payer: Self-pay | Admitting: Physical Therapy

## 2016-12-09 ENCOUNTER — Ambulatory Visit (INDEPENDENT_AMBULATORY_CARE_PROVIDER_SITE_OTHER): Payer: 59 | Admitting: Physical Therapy

## 2016-12-09 DIAGNOSIS — M6281 Muscle weakness (generalized): Secondary | ICD-10-CM

## 2016-12-09 DIAGNOSIS — R29898 Other symptoms and signs involving the musculoskeletal system: Secondary | ICD-10-CM | POA: Diagnosis not present

## 2016-12-09 DIAGNOSIS — M542 Cervicalgia: Secondary | ICD-10-CM | POA: Diagnosis not present

## 2016-12-09 NOTE — Therapy (Signed)
[place], [place], [address] Phone: [phone]   Fax:  [phone]  Physical Therapy Evaluation  Patient Details  Name: Wendy Anderson MRN: [id number] Date of Birth: 1970-07-07 Referring Provider: Iran Planas PA  Encounter Date: 12/09/2016      PT End of Session - 12/09/16 0807    Visit Number 1   Number of Visits 6   Date for PT Re-Evaluation 01/20/17   PT Start Time 0808   PT Stop Time 0900   PT Time Calculation (min) 52 min   Activity Tolerance Patient tolerated treatment well      Past Medical History:  Diagnosis Date  . Migraine   . Prediabetes     Past Surgical History:  Procedure Laterality Date  . bladder tack    . BONE EXOSTOSIS EXCISION     removal from left femur(benign)  . PARTIAL HYSTERECTOMY    . removal of bone spurs     form 4th and 5th toes  . TERATOMA EXCISION     bilateral  . TOTAL HIP ARTHROPLASTY     right hip  . UMBILICAL HERNIA REPAIR      There were no vitals filed for this visit.       Subjective Assessment - 12/09/16 0809    Subjective Pt reports the feelings she is having in her neck is different starting about a month ago. It feels like a discomfort in her spine along with a headache.  She has been working on her posture and maintaining upright positioning.  She did change her readers for her computer and that has helped some.    Pertinent History headaches - last 3 days, have once a month - starts side of neck and moves up and around her head.    How long can you sit comfortably? no limitations    Diagnostic tests nothing new   Patient Stated Goals loosening up her neck and gain motion   Currently in Pain? No/denies   Aggravating Factors  first thing in AM usually the worst            [facility] PT Assessment - 12/09/16 0001      Assessment   Medical Diagnosis cervical DDD with herniation   Referring Provider Iran Planas PA   Onset  Date/Surgical Date 10/08/16   Hand Dominance Right   Next MD Visit PRN   Prior Therapy over a year ago     Precautions   Precautions None     Balance Screen   Has the patient fallen in the past 6 months No     Prior Function   Level of Independence Independent   Vocation Full time employment   Vocation Requirements desk   Leisure getting ready for her first grandchild, walking for exercise     Observation/Other Assessments   Focus on Therapeutic Outcomes (FOTO)  25% limited     Sensation   Additional Comments numbness, tingling in bilat hands, wakes her at night and sometimes during the day. Burning in the Lt upper trap     Posture/Postural Control   Posture/Postural Control Postural limitations   Postural Limitations Decreased thoracic kyphosis;Forward head  decreased cervical curve     ROM / Strength   AROM / PROM / Strength AROM;Strength     AROM   Overall AROM Comments traps mid/low 4/5 with slight pain Lt side   AROM Assessment Site Shoulder;Cervical   Right/Left Shoulder --  bilat WNL  Cervical Flexion WNL   Cervical Extension WNL   Cervical - Right Side Bend 22   Cervical - Left Side Bend 36   Cervical - Right Rotation 68   Cervical - Left Rotation 76     Strength   Strength Assessment Site Shoulder;Elbow   Right/Left Shoulder --  WNL   Right/Left Elbow --  WNL     Palpation   Spinal mobility cervical WNL, C7-T5 tight and tender with CPA and bilat UPA mobs   Palpation comment trigger point in Lt upper trap/levator, tightness at base of occiput     Special Tests    Special Tests --  (-) spurlings            Objective measurements completed on examination: See above findings.          [facility] Adult PT Treatment/Exercise - 12/09/16 0001      Exercises   Exercises Neck     Neck Exercises: Supine   Other Supine Exercise 20 reps, red band , on whole boslter, overhead pull, horizontal abduction, SASH, ER, head presses   Other Supine  Exercise thoracic self mobs using bolster.      Modalities   Modalities Traction     Traction   Type of Traction Cervical   Min (lbs) 7   Max (lbs) 14   Hold Time 5   Rest Time 5   Time 15                PT Education - 12/09/16 0835    Education provided Yes   Education Details HEP   Person(s) Educated Patient   Methods Explanation;Demonstration;Handout   Comprehension Returned demonstration;Verbalized understanding             PT Long Term Goals - 12/09/16 1314      PT LONG TERM GOAL #1   Title I with advanced HEP (01/20/17)    Time 6   Period Weeks   Status New     PT LONG TERM GOAL #2   Title report overall pain reduction in her headaches/numbness in her arms to her baseline with daily activity  ( 01/20/17)    Time 6   Period Weeks   Status New     PT LONG TERM GOAL #3   Title maintain FOTO within 5 points of 25% limited (01/20/17)     Time 6   Period Weeks   Status New     PT LONG TERM GOAL #4   Title improve upper back strength 5-/5 ( 01/20/17)    Time 6   Period Weeks   Status New     PT LONG TERM GOAL #5   Title demo cervical ROM WNL and painfree in all directions ( 01/20/17)    Time 6   Period Weeks   Status New                Plan - 12/09/16 1311    Clinical Impression Statement 46 yo female with long h/o cervical concerns,  Had been doing well however recently she has noticed an increase in headaches, numbness into her arms and a change in how the neck symptoms present.  She has some stiffness in her cervical spine, upper back weakness, trigger points and tightness in her upper shoulders and neck.    Clinical Presentation Stable   Clinical Decision Making Low   PT Frequency 1x / week   PT Duration 6 weeks   PT Treatment/Interventions Moist Heat;Traction;Ultrasound;Therapeutic exercise;Dry  needling;Taping;Manual techniques;Neuromuscular re-education;Cryotherapy;Electrical Stimulation;Iontophoresis 4mg /ml  Dexamethasone;Patient/family education   PT Next Visit Plan manual work/DN to trigger point in upper trap and base of occiput, cervical stabilization, assess response to traction, increase pull if no headache after.    Consulted and Agree with Plan of Care Patient      Patient will benefit from skilled therapeutic intervention in order to improve the following deficits and impairments:  Decreased range of motion, Impaired UE functional use, Increased muscle spasms, Pain, Decreased strength  Visit Diagnosis: Cervicalgia - Plan: PT plan of care cert/re-cert  Muscle weakness (generalized) - Plan: PT plan of care cert/re-cert  Other symptoms and signs involving the musculoskeletal system - Plan: PT plan of care cert/re-cert     Problem List Patient Active Problem List   Diagnosis Date Noted  . Cervical herniated disc 12/02/2016  . 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 Stress at home 02/27/2016  . Anxiety state 02/27/2016  . Right shoulder pain 07/20/2015  . Chest discomfort 03/28/2015  . Essential hypertension 12/07/2014  . Hyperlipidemia 11/11/2013  . Vitamin D insufficiency 04/12/2013  . Hemorrhoids 04/12/2013  . Insomnia 04/12/2013  . Degenerative disc disease, cervical 04/12/2013  . Hematuria 04/12/2013  . Status post right hip replacement 2012 [facility] 04/11/2013  . 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 Prediabetes 04/11/2013  . Tendinitis of left rotator cuff 04/11/2013  . Gluten intolerance 04/11/2013    Jeral Pinch PT 12/09/2016, 1:18 PM  [facility] [place], [place], [address] Phone: [phone]   Fax:  [phone]  Name: Wendy Anderson MRN: [id number] Date of Birth: Dec 08, 1970

## 2016-12-09 NOTE — Patient Instructions (Addendum)
Head Press With [employer] - perform on bolster    Tuck chin SLIGHTLY toward chest, keep mouth closed. Feel weight on back of head. Increase weight by pressing head down. Hold _3-5__ seconds. Relax. Repeat _10__ times. Surface: bolster.  Once a day  Cervico-Thoracic (Standing)    Stand with back against wall, left arm elevated and pressed against wall. Turn head toward bent arm. Hold ___3-5_ seconds. Relax. Repeat _10___ times per set. Do _1___ sets per session. Do __1__ sessions per day. Repeat on the other side.   Thoracic Self-Mobilization Stretch (Supine) - can use a towel or pool noodle    With small rolled towel at lower ribs level, gently lie back until stretch is felt. Hold __3-5__ mins. Relax. Repeat _1___ times per set. Do __1__ sets per session. Do _as needed, at least once a day___ sessions per day.   Thoracic Self-Mobilization (Supine)    With rolled towel placed lengthwise at lower ribs level, lie back on towel with arms outstretched. Hold _3-5___ mins. Relax. Repeat __1__ times per set. Do __1__ sets per session. Do __as needed, at least once a day__ sessions per day.  Over Head Pull: Narrow Grip Perform on bolster      On back, knees bent, feet flat, band across thighs, elbows straight but relaxed. Pull hands apart (start). Keeping elbows straight, bring arms up and over head, hands toward floor. Keep pull steady on band. Hold momentarily. Return slowly, keeping pull steady, back to start. Repeat _20__ times. Band color ___red___ . Once a day  Side Pull: Double Arm - on bolster   On back, knees bent, feet flat. Arms perpendicular to body, shoulder level, elbows straight but relaxed. Pull arms out to sides, elbows straight. Resistance band comes across collarbones, hands toward floor. Hold momentarily. Slowly return to starting position. Repeat _20__ times. Band color _red____ Once a day  Sash on bolster   On back, knees bent, feet flat, left hand on left hip,  right hand above left. Pull right arm DIAGONALLY (hip to shoulder) across chest. Bring right arm along head toward floor. Hold momentarily. Slowly return to starting position. Repeat 20___ times. Do with left arm. Band color ___red___  Once a day  Shoulder Rotation: Double Arm - on bolster   On back, knees bent, feet flat, elbows tucked at sides, bent 90, hands palms up. Pull hands apart and down toward floor, keeping elbows near sides. Hold momentarily. Slowly return to starting position. Repeat _20__ times. Band color _red_____Once a day.

## 2016-12-17 ENCOUNTER — Encounter: Payer: Self-pay | Admitting: Physical Therapy

## 2016-12-17 ENCOUNTER — Ambulatory Visit (INDEPENDENT_AMBULATORY_CARE_PROVIDER_SITE_OTHER): Payer: 59 | Admitting: Physical Therapy

## 2016-12-17 DIAGNOSIS — M542 Cervicalgia: Secondary | ICD-10-CM

## 2016-12-17 DIAGNOSIS — M6281 Muscle weakness (generalized): Secondary | ICD-10-CM | POA: Diagnosis not present

## 2016-12-17 DIAGNOSIS — R29898 Other symptoms and signs involving the musculoskeletal system: Secondary | ICD-10-CM | POA: Diagnosis not present

## 2016-12-17 NOTE — Therapy (Signed)
[place], [place], [address] Phone: [phone]   Fax:  [phone]  Physical Therapy Treatment  Patient Details  Name: Wendy Anderson MRN: [id number] Date of Birth: 01-07-1971 Referring Provider: Iran Planas PA  Encounter Date: 12/17/2016      PT End of Session - 12/17/16 0715    Visit Number 2   Number of Visits 6   Date for PT Re-Evaluation 01/20/17   PT Start Time 0715   PT Stop Time 0811   PT Time Calculation (min) 56 min   Activity Tolerance Patient tolerated treatment well      Past Medical History:  Diagnosis Date  . Migraine   . Prediabetes     Past Surgical History:  Procedure Laterality Date  . bladder tack    . BONE EXOSTOSIS EXCISION     removal from left femur(benign)  . PARTIAL HYSTERECTOMY    . removal of bone spurs     form 4th and 5th toes  . TERATOMA EXCISION     bilateral  . TOTAL HIP ARTHROPLASTY     right hip  . UMBILICAL HERNIA REPAIR      There were no vitals filed for this visit.      Subjective Assessment - 12/17/16 0715    Subjective Pt reports she did well with the traction.  Haven't woken up with any headaches since the last visit.  She likes the exercise       No pain reported at todays session, except with dry needling, this resolved after treatment.                   [facility] Adult PT Treatment/Exercise - 12/17/16 0001      Neck Exercises: Seated   Other Seated Exercise shoulder rolls FWD/BWD     Modalities   Modalities Moist Heat     Moist Heat Therapy   Number Minutes Moist Heat 20 Minutes   Moist Heat Location Cervical  with traction     Traction   Type of Traction Cervical   Min (lbs) 8   Max (lbs) 16   Hold Time 5   Rest Time 5   Time 19     Manual Therapy   Manual Therapy Soft tissue mobilization   Soft tissue mobilization STM to periocciput muscles, bilat upper traps and levators and cervical paraspinals      Neck Exercises: Stretches   Upper Trapezius Stretch 30 seconds   Levator Stretch 30 seconds          Trigger Point Dry Needling - 12/17/16 0753    Consent Given? Yes   Education Handout Provided No   Muscles Treated Upper Body Upper trapezius;Oblique capitus;Suboccipitals muscle group   Upper Trapezius Response Palpable increased muscle length;Twitch reponse elicited   Oblique Capitus Response Palpable increased muscle length;Twitch response elicited   SubOccipitals Response Palpable increased muscle length;Twitch response elicited                   PT Long Term Goals - 12/17/16 0755      PT LONG TERM GOAL #1   Title I with advanced HEP (01/20/17)    Status On-going     PT LONG TERM GOAL #2   Title report overall pain reduction in her headaches/numbness in her arms to her baseline with daily activity  ( 01/20/17)    Status On-going     PT LONG TERM GOAL #3   Title maintain  FOTO within 5 points of 25% limited (01/20/17)     Status On-going     PT LONG TERM GOAL #4   Title improve upper back strength 5-/5 ( 01/20/17)    Status On-going     PT LONG TERM GOAL #5   Title demo cervical ROM WNL and painfree in all directions ( 01/20/17)    Status Achieved               Plan - 12/17/16 0756    Clinical Impression Statement This is Wendy Anderson's second visit, she has had no headaches since her last visit,  continues to have muscular tightness in the cervical region.  Responed well to treatment today and tolerated increased cervical pull on traction.  She has met one goal and making progress to the others.    Rehab Potential Excellent   PT Frequency 1x / week   PT Duration 6 weeks   PT Treatment/Interventions Moist Heat;Traction;Ultrasound;Therapeutic exercise;Dry needling;Taping;Manual techniques;Neuromuscular re-education;Cryotherapy;Electrical Stimulation;Iontophoresis 71m/ml Dexamethasone;Patient/family education   PT Next Visit Plan DN/traction PRN, add  in more mid back strengthening.    Consulted and Agree with Plan of Care Patient      Patient will benefit from skilled therapeutic intervention in order to improve the following deficits and impairments:  Decreased range of motion, Impaired UE functional use, Increased muscle spasms, Pain, Decreased strength  Visit Diagnosis: Cervicalgia  Muscle weakness (generalized)  Other symptoms and signs involving the musculoskeletal system     Problem List Patient Active Problem List   Diagnosis Date Noted  . Cervical herniated disc 12/02/2016  . 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 Stress at home 02/27/2016  . Anxiety state 02/27/2016  . Right shoulder pain 07/20/2015  . Chest discomfort 03/28/2015  . Essential hypertension 12/07/2014  . Hyperlipidemia 11/11/2013  . Vitamin D insufficiency 04/12/2013  . Hemorrhoids 04/12/2013  . Insomnia 04/12/2013  . Degenerative disc disease, cervical 04/12/2013  . Hematuria 04/12/2013  . [facility]  . 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 Prediabetes 04/11/2013  . Tendinitis of left rotator cuff 04/11/2013  . Gluten intolerance 04/11/2013    SusanShaver PT 12/17/2016, 7:59 AM  [facility] [address]Phone: [phone]  Fax:  [phone] Name: Wendy LowdermilkMRN: [id number] of Birth: 211-09-1970

## 2016-12-24 ENCOUNTER — Encounter: Payer: Self-pay | Admitting: Physical Therapy

## 2016-12-24 ENCOUNTER — Ambulatory Visit (INDEPENDENT_AMBULATORY_CARE_PROVIDER_SITE_OTHER): Payer: 59 | Admitting: Physical Therapy

## 2016-12-24 DIAGNOSIS — M542 Cervicalgia: Secondary | ICD-10-CM

## 2016-12-24 DIAGNOSIS — R29898 Other symptoms and signs involving the musculoskeletal system: Secondary | ICD-10-CM | POA: Diagnosis not present

## 2016-12-24 DIAGNOSIS — M6281 Muscle weakness (generalized): Secondary | ICD-10-CM | POA: Diagnosis not present

## 2016-12-24 NOTE — Therapy (Signed)
[place], [place], [address] Phone: [phone]   Fax:  [phone]  Physical Therapy Treatment  Patient Details  Name: Wendy Anderson MRN: [id number] Date of Birth: 02-13-1971 Referring Provider: Iran Planas PA  Encounter Date: 12/24/2016      PT End of Session - 12/24/16 0717    Visit Number 3   Number of Visits 6   Date for PT Re-Evaluation 01/20/17   PT Start Time 0717   PT Stop Time 0809   PT Time Calculation (min) 52 min   Activity Tolerance Patient tolerated treatment well      Past Medical History:  Diagnosis Date  . Migraine   . Prediabetes     Past Surgical History:  Procedure Laterality Date  . bladder tack    . BONE EXOSTOSIS EXCISION     removal from left femur(benign)  . PARTIAL HYSTERECTOMY    . removal of bone spurs     form 4th and 5th toes  . TERATOMA EXCISION     bilateral  . TOTAL HIP ARTHROPLASTY     right hip  . UMBILICAL HERNIA REPAIR      There were no vitals filed for this visit.      Subjective Assessment - 12/24/16 0717    Subjective [name] reports she was pretty good over the last week.  Sunday night doing her HEP she had less tension, Monday night it was back - after being at work and on the computer.    Pertinent History headaches - last 3 days, have once a month - starts side of neck and moves up and around her head.    Patient Stated Goals loosening up her neck and gain motion   Currently in Pain? No/denies                         [facility] Adult PT Treatment/Exercise - 12/24/16 0001      Self-Care   Self-Care Posture   Posture discussed computer work station set up, she has some restrictions with the way the computer is.  She is going to work on changing this to improve this.  Recommended she take more frequent breaks for exercise.      Neck Exercises: Machines for Strengthening   UBE (Upper Arm Bike) L2x4' alt FWD/BWD     Neck Exercises: Prone   Other Prone Exercise 3x10 T's/W's with 1#, Y's no wt     Modalities   Modalities Traction     Traction   Type of Traction Cervical   Min (lbs) 9   Max (lbs) 18   Hold Time 5   Rest Time 5   Time 20     Manual Therapy   Manual Therapy Soft tissue mobilization;Myofascial release;Joint mobilization   Manual therapy comments in supine   Joint Mobilization cervical grade II UPA, posterior shoulder mobs with prolonged hold then deep pressure to pecs   Soft tissue mobilization STM bilat cervical paraspinals, upper traps, levators and pecs   Myofascial Release occipital base release                     PT Long Term Goals - 12/24/16 0752      PT LONG TERM GOAL #1   Title I with advanced HEP (01/20/17)    Status On-going     PT LONG TERM GOAL #2   Title report overall pain reduction in her  headaches/numbness in her arms to her baseline with daily activity  ( 01/20/17)    Status On-going     PT LONG TERM GOAL #3   Title maintain FOTO within 5 points of 25% limited (01/20/17)     Status On-going     PT LONG TERM GOAL #4   Title improve upper back strength 5-/5 ( 01/20/17)    Status On-going     PT LONG TERM GOAL #5   Title demo cervical ROM WNL and painfree in all directions ( 01/20/17)    Status Achieved               Plan - 12/24/16 0752    Clinical Impression Statement Pt continues with no headaches, she is still having tension in the neck first thing in AM.  She reports this is decreasing.  She will continue to work on modify her work station and will start using a timer to remind her to take stretch breaks while working.  Progressing to goals Held DN today and focused on manua STM and mobs   Rehab Potential Excellent   PT Frequency 1x / week   PT Duration 6 weeks   PT Treatment/Interventions Moist Heat;Traction;Ultrasound;Therapeutic exercise;Dry needling;Taping;Manual techniques;Neuromuscular re-education;Cryotherapy;Electrical  Stimulation;Iontophoresis 4mg /ml Dexamethasone;Patient/family education   PT Next Visit Plan assess periocciput tightness   Consulted and Agree with Plan of Care Patient      Patient will benefit from skilled therapeutic intervention in order to improve the following deficits and impairments:  Decreased range of motion, Impaired UE functional use, Increased muscle spasms, Pain, Decreased strength  Visit Diagnosis: Cervicalgia  Muscle weakness (generalized)  Other symptoms and signs involving the musculoskeletal system     Problem List Patient Active Problem List   Diagnosis Date Noted  . Cervical herniated disc 12/02/2016  . 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 Stress at home 02/27/2016  . Anxiety state 02/27/2016  . Right shoulder pain 07/20/2015  . Chest discomfort 03/28/2015  . Essential hypertension 12/07/2014  . Hyperlipidemia 11/11/2013  . Vitamin D insufficiency 04/12/2013  . Hemorrhoids 04/12/2013  . Insomnia 04/12/2013  . Degenerative disc disease, cervical 04/12/2013  . Hematuria 04/12/2013  . Status post right hip replacement 2012 [facility] 04/11/2013  . 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 Prediabetes 04/11/2013  . Tendinitis of left rotator cuff 04/11/2013  . Gluten intolerance 04/11/2013    Jeral Pinch PT 12/24/2016, 7:57 AM  [facility] [place], [place], [address] Phone: [phone]   Fax:  [phone]  Name: Wendy Anderson MRN: [id number] Date of Birth: [birth year]

## 2016-12-24 NOTE — Patient Instructions (Addendum)
Scapular Retraction: Abduction / Extension (Prone)    Lie with arms out from sides 90. Pinch shoulder blades together and raise arms a few inches from floor. Use 1# wt Repeat __10__ times per set. Do __3__ sets per session. Do _every other___day.   Scapular Retraction: Abduction (Prone) use 1# wt    Lie with upper arms straight out from sides, elbows bent to 90. Pinch shoulder blades together and raise arms a few inches from floor. Repeat _10___ times per set. Do __3__ sets per session. Do _every other ___day.  Scapular: Flexion (Prone)    Holding _0-1___ pound weights, raise both arms forward. Keep elbows straight. Repeat __10__ times per set. Do _3___ sets per session. Do _every other__day.  Yoga Eagle Pose, Standing    Stand, arms crossed at elbows, hands clasped in front of face. Slowly push elbows down. For a greater stretch slowly lower chin to chest. Hold _20__ seconds. Repeat _1__ times per session. Do __every other day.

## 2016-12-31 ENCOUNTER — Ambulatory Visit (INDEPENDENT_AMBULATORY_CARE_PROVIDER_SITE_OTHER): Payer: 59 | Admitting: Physical Therapy

## 2016-12-31 ENCOUNTER — Encounter: Payer: Self-pay | Admitting: Physical Therapy

## 2016-12-31 DIAGNOSIS — M6281 Muscle weakness (generalized): Secondary | ICD-10-CM

## 2016-12-31 DIAGNOSIS — M542 Cervicalgia: Secondary | ICD-10-CM

## 2016-12-31 DIAGNOSIS — R29898 Other symptoms and signs involving the musculoskeletal system: Secondary | ICD-10-CM

## 2016-12-31 NOTE — Therapy (Signed)
[place], [place], [address] Phone: [phone]   Fax:  [phone]  Physical Therapy Treatment  Patient Details  Name: Wendy Anderson MRN: [id number] Date of Birth: [birth year] Referring Provider: Iran Planas PA  Encounter Date: 12/31/2016      PT End of Session - 12/31/16 0715    Visit Number 4   Number of Visits 6   Date for PT Re-Evaluation 01/20/17   PT Start Time 0715   PT Stop Time 0813   PT Time Calculation (min) 58 min   Activity Tolerance Patient tolerated treatment well      Past Medical History:  Diagnosis Date  . Migraine   . Prediabetes     Past Surgical History:  Procedure Laterality Date  . bladder tack    . BONE EXOSTOSIS EXCISION     removal from left femur(benign)  . PARTIAL HYSTERECTOMY    . removal of bone spurs     form 4th and 5th toes  . TERATOMA EXCISION     bilateral  . TOTAL HIP ARTHROPLASTY     right hip  . UMBILICAL HERNIA REPAIR      There were no vitals filed for this visit.      Subjective Assessment - 12/31/16 0717    Subjective Pt is not sure what happened, if new exercise or changes in the shoulder however when ever she performed side to side motion of her arm ( scrubbing pot in sink) pain would shoot down both arms. So she stopped the exercise and the pain has almost stopped.    Currently in Pain? Yes   Pain Score 1    Pain Location Shoulder   Pain Orientation Left   Pain Descriptors / Indicators Dull   Pain Type Chronic pain   Pain Onset More than a month ago   Pain Frequency Intermittent   Aggravating Factors  moving her head   Pain Relieving Factors rest            [facility] PT Assessment - 12/31/16 0001      Assessment   Medical Diagnosis cervical DDD with herniation   Referring Provider Iran Planas PA   Onset Date/Surgical Date 10/08/16   Hand Dominance Right     AROM   Cervical Flexion WNL  slight pain in her spine   Cervical Extension WNL   Cervical - Right Side Bend 25  28 ater DN   Cervical - Left Side Bend 45   Cervical - Right Rotation 68  75 after DN    Cervical - Left Rotation 70     Strength   Overall Strength Other (comment)  upper back 4+/5                     [facility] Adult PT Treatment/Exercise - 12/31/16 0001      Neck Exercises: Machines for Strengthening   UBE (Upper Arm Bike) L2x4' alt FWD/BWD     Neck Exercises: Supine   Other Supine Exercise 20 reps, green band , on whole boslter, overhead pull, horizontal abduction, SASH, ER, head presses  VC for form     Modalities   Modalities Electrical Stimulation;Moist Heat     Moist Heat Therapy   Number Minutes Moist Heat 15 Minutes   Moist Heat Location Shoulder  Lt     Electrical Stimulation   Electrical Stimulation Location Lt cervical/upper trap   Electrical Stimulation Action IFC   Electrical Stimulation Parameters  to tolerance   Electrical Stimulation Goals Pain;Tone     Manual Therapy   Manual Therapy Soft tissue mobilization   Soft tissue mobilization STM to Lt upper trap, scalenes          Trigger Point Dry Needling - 12/31/16 0800    Consent Given? Yes   Education Handout Provided No   Muscles Treated Upper Body Upper trapezius   Upper Trapezius Response Twitch reponse elicited;Palpable increased muscle length  LT, she will have a small bruise.                    PT Long Term Goals - 12/31/16 0730      PT LONG TERM GOAL #1   Title I with advanced HEP (01/20/17)    Status On-going  had to modify due to pain in bilat UE's with new exercise     PT LONG TERM GOAL #2   Title report overall pain reduction in her headaches/numbness in her arms to her baseline with daily activity  ( 01/20/17)    Status On-going  pt reports less cervical tension, occassional dull pressure in her head with waking. No real headaches since starting PT     PT LONG TERM GOAL #3   Title maintain FOTO  within 5 points of 25% limited (01/20/17)     Status On-going     PT LONG TERM GOAL #4   Title improve upper back strength 5-/5 ( 01/20/17)    Status On-going     PT LONG TERM GOAL #5   Title demo cervical ROM WNL and painfree in all directions ( 01/20/17)    Status Partially Met               Plan - 12/31/16 0925    Clinical Impression Statement Wendy Anderson is happy that she is not having headaches, still reports tension/tightness in the Lt upper shoulder with decreased cervical motion in some dirctions.  She responded well to therapy today and had increased ROM after this. No new goals met.    Rehab Potential Excellent   PT Frequency 1x / week   PT Duration 6 weeks   PT Treatment/Interventions Moist Heat;Traction;Ultrasound;Therapeutic exercise;Dry needling;Taping;Manual techniques;Neuromuscular re-education;Cryotherapy;Electrical Stimulation;Iontophoresis 77m/ml Dexamethasone;Patient/family education   PT Next Visit Plan asses tolerance to exercise progression, retry traction if indicated.    Consulted and Agree with Plan of Care Patient      Patient will benefit from skilled therapeutic intervention in order to improve the following deficits and impairments:  Decreased range of motion, Impaired UE functional use, Increased muscle spasms, Pain, Decreased strength  Visit Diagnosis: Cervicalgia  Muscle weakness (generalized)  Other symptoms and signs involving the musculoskeletal system     Problem List Patient Active Problem List   Diagnosis Date Noted  . Cervical herniated disc 12/02/2016  . 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 Stress at home 02/27/2016  . Anxiety state 02/27/2016  . Right shoulder pain 07/20/2015  . Chest discomfort 03/28/2015  . Essential hypertension 12/07/2014  . Hyperlipidemia 11/11/2013  . Vitamin D insufficiency 04/12/2013  . Hemorrhoids 04/12/2013  . Insomnia 04/12/2013  . Degenerative disc disease, cervical 04/12/2013  . Hematuria  04/12/2013  . [facility]  . 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 Prediabetes 04/11/2013  . Tendinitis of left rotator cuff 04/11/2013  . Gluten intolerance 04/11/2013    SJeral PinchPT 12/31/2016, 9:28 AM  [place] [address]  [place], [place], [address] Phone: [phone]   Fax:  [phone]  Name: Wendy Anderson MRN: [id number] Date of Birth: 02-Oct-1970

## 2017-01-07 ENCOUNTER — Encounter: Payer: Self-pay | Admitting: Physical Therapy

## 2017-01-07 ENCOUNTER — Ambulatory Visit (INDEPENDENT_AMBULATORY_CARE_PROVIDER_SITE_OTHER): Payer: 59 | Admitting: Physical Therapy

## 2017-01-07 DIAGNOSIS — R29898 Other symptoms and signs involving the musculoskeletal system: Secondary | ICD-10-CM

## 2017-01-07 DIAGNOSIS — M6281 Muscle weakness (generalized): Secondary | ICD-10-CM

## 2017-01-07 DIAGNOSIS — M542 Cervicalgia: Secondary | ICD-10-CM | POA: Diagnosis not present
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
## 2017-01-12 LAB — HM PAP SMEAR: HM PAP: NEGATIVE

## 2017-01-15 ENCOUNTER — Encounter: Payer: 59 | Admitting: Physical Therapy

## 2017-01-21 ENCOUNTER — Ambulatory Visit (INDEPENDENT_AMBULATORY_CARE_PROVIDER_SITE_OTHER): Payer: 59 | Admitting: Physical Therapy

## 2017-01-21 DIAGNOSIS — M542 Cervicalgia: Secondary | ICD-10-CM | POA: Diagnosis not present

## 2017-01-21 DIAGNOSIS — R29898 Other symptoms and signs involving the musculoskeletal system: Secondary | ICD-10-CM

## 2017-01-21 DIAGNOSIS — M6281 Muscle weakness (generalized): Secondary | ICD-10-CM

## 2017-01-21 NOTE — Therapy (Addendum)
[place], [place], [address] Phone: [phone]   Fax:  [phone]  Physical Therapy Treatment  Patient Details  Name: Trula Frede MRN: [id number] Date of Birth: [birth year] Referring Provider: Iran Planas, PA  Encounter Date: 01/21/2017      PT End of Session - 01/21/17 3810    Visit Number 6   Number of Visits 6   Date for PT Re-Evaluation 01/20/17   PT Start Time 0720  pt arrived late   PT Stop Time 0809   PT Time Calculation (min) 49 min   Activity Tolerance Patient tolerated treatment well   Behavior During Therapy [facility] for tasks assessed/performed      Past Medical History:  Diagnosis Date  . Migraine   . Prediabetes     Past Surgical History:  Procedure Laterality Date  . bladder tack    . BONE EXOSTOSIS EXCISION     removal from left femur(benign)  . PARTIAL HYSTERECTOMY    . removal of bone spurs     form 4th and 5th toes  . TERATOMA EXCISION     bilateral  . TOTAL HIP ARTHROPLASTY     right hip  . UMBILICAL HERNIA REPAIR      There were no vitals filed for this visit.      Subjective Assessment - 01/21/17 0732    Subjective [facility] reports much improved numbness in UE.  She is able to control headaches with stretches, heat, medication.  She verbalizes readiness to d/c to HEP after today's visit.    Currently in Pain? No/denies   Pain Score 0-No pain            [facility] PT Assessment - 01/21/17 0001      Assessment   Medical Diagnosis cervical DDD with herniation   Referring Provider Iran Planas, PA   Onset Date/Surgical Date 10/08/16   Hand Dominance Right   Next MD Visit PRN     Observation/Other Assessments   Focus on Therapeutic Outcomes (FOTO)  12% limited     AROM   Cervical - Right Side Bend 40  pain at end range, in Lt neck   Cervical - Left Side Bend 45  Pain at end range   Cervical - Right Rotation 75   Cervical - Left Rotation 72      Strength   Overall Strength Other (comment)  Lt lower trap 4/5, Rt 5-/5           [facility] Adult PT Treatment/Exercise - 01/21/17 0001      Neck Exercises: Supine   Cervical Isometrics Extension;20 reps;5 secs  into ball off EOB   Other Supine Exercise 10 deep neck flexion exercise     Neck Exercises: Prone   Axial Exentsion 10 reps   Other Prone Exercise 2x10 cervical diagonals on elbows     Moist Heat Therapy   Number Minutes Moist Heat 19 Minutes   Moist Heat Location Cervical     Electrical Stimulation   Electrical Stimulation Location bilat cervical paraspinals/ upper trap   Electrical Stimulation Action IFC   Electrical Stimulation Parameters to tolerance    Electrical Stimulation Goals Pain     Traction   Type of Traction Cervical   Min (lbs) 8   Max (lbs) 16   Hold Time 60   Rest Time 20   Time 19      Reviewed verbally (and 1 rep physically) current HEP.  Pt issued green  band to use with exercises.         PT Long Term Goals - 01/21/17 0732      PT LONG TERM GOAL #1   Title I with advanced HEP (01/20/17)    Time 6   Period Weeks   Status Achieved     PT LONG TERM GOAL #2   Title report overall pain reduction in her headaches/numbness in her arms to her baseline with daily activity  ( 01/20/17)    Time 6   Period Weeks   Status Achieved     PT LONG TERM GOAL #3   Title maintain FOTO within 5 points of 25% limited (01/20/17)     Time 6   Period Weeks   Status Achieved     PT LONG TERM GOAL #4   Title improve upper back strength 5-/5 ( 01/20/17)    Time 6   Period Weeks   Status Partially Met     PT LONG TERM GOAL #5   Title demo cervical ROM WNL and painfree in all directions ( 01/20/17)    Time 6   Period Weeks   Status Partially Met               Plan - 01/21/17 0801    Clinical Impression Statement Pt tolerated treatment well, reporting less tightness in cervical region at end of session.  Pt has partially met her goals  and requests to d/c to HEP at this time. She reports she has confidence she can manage her symptoms with the tools provided in therapy.    Rehab Potential Excellent   PT Frequency 1x / week   PT Duration 6 weeks   PT Treatment/Interventions Moist Heat;Traction;Ultrasound;Therapeutic exercise;Dry needling;Taping;Manual techniques;Neuromuscular re-education;Cryotherapy;Electrical Stimulation;Iontophoresis 67m/ml Dexamethasone;Patient/family education   PT Next Visit Plan spoke to supervising PT; will d/c to HEP.,    Consulted and Agree with Plan of Care Patient      Patient will benefit from skilled therapeutic intervention in order to improve the following deficits and impairments:  Decreased range of motion, Impaired UE functional use, Increased muscle spasms, Pain, Decreased strength  Visit Diagnosis: Cervicalgia  Muscle weakness (generalized)  Other symptoms and signs involving the musculoskeletal system     Problem List Patient Active Problem List   Diagnosis Date Noted  . Cervical herniated disc 12/02/2016  . 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 Stress at home 02/27/2016  . Anxiety state 02/27/2016  . Right shoulder pain 07/20/2015  . Chest discomfort 03/28/2015  . Essential hypertension 12/07/2014  . Hyperlipidemia 11/11/2013  . Vitamin D insufficiency 04/12/2013  . Hemorrhoids 04/12/2013  . Insomnia 04/12/2013  . Degenerative disc disease, cervical 04/12/2013  . Hematuria 04/12/2013  . [facility]  . 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 Prediabetes 04/11/2013  . Tendinitis of left rotator cuff 04/11/2013  . Gluten intolerance 04/11/2013   JKerin Perna PTA 01/21/17 8:03 AM  [place] [address]Phone: [phone]  Fax:  [phone] Name: DDora SimeoneMRN: [id number] of Birth: [birth year]  PHYSICAL THERAPY DISCHARGE  SUMMARY  Visits from Start of Care: 6  Current functional level related to goals / functional outcomes: See above   Remaining deficits: Some cervical ROM limitations   Education / Equipment: HEP Plan: Patient agrees to discharge.  Patient goals were partially met. Patient is being discharged due to being pleased with the current functional level.  ?????  Jeral Pinch, PT 02/04/17 10:45 AM

## 2017-03-20 ENCOUNTER — Ambulatory Visit (INDEPENDENT_AMBULATORY_CARE_PROVIDER_SITE_OTHER): Payer: 59 | Admitting: Family Medicine

## 2017-03-20 ENCOUNTER — Encounter: Payer: Self-pay | Admitting: Family Medicine

## 2017-03-20 ENCOUNTER — Ambulatory Visit (INDEPENDENT_AMBULATORY_CARE_PROVIDER_SITE_OTHER): Payer: 59

## 2017-03-20 VITALS — BP 140/89 | HR 89 | Ht 63.0 in | Wt 169.0 lb

## 2017-03-20 DIAGNOSIS — M1611 Unilateral primary osteoarthritis, right hip: Secondary | ICD-10-CM

## 2017-03-20 DIAGNOSIS — M25551 Pain in right hip: Secondary | ICD-10-CM

## 2017-03-20 DIAGNOSIS — K581 Irritable bowel syndrome with constipation: Secondary | ICD-10-CM

## 2017-03-20 DIAGNOSIS — Z23 Encounter for immunization: Secondary | ICD-10-CM

## 2017-03-20 NOTE — Progress Notes (Signed)
Subjective:    Patient ID: Wendy Anderson, female    DOB: [birth year], 46 y.o.   MRN: [id number]  HPI 45 year old female comes in today complaining of right hip pain.  She has a prior history of right hip replacement.  She fell from a standing position about 5 weeks ago and landed on her right side/hip she.  Is mostly been using Tylenol which does help her pain but feels like the pain has changed in nature.  He is not hurting currently.  As the pain does come and go.  She has not tried any heat or ice and to try the Mobic 1 day.   She also reports history of problems with chronic constipation.  In the past she has been diagnosed with IBS.  She had a colonoscopy abundant about 6 years ago.  She says her daughter had some food allergy testing done and when she avoided the foods that came back positive on her test that has made a huge difference in how she feels.  She even feels like she has gets a lot of tightness and soreness in her joints in her hands. she normally eats a gluten free diet but hasn't been as strick over [employer].   Review of Systems  BP 140/89   Pulse 89   Ht 5\' 3"  (1.6 m)   Wt 169 lb (76.7 kg)   SpO2 99%   BMI 29.94 kg/m     Allergies  Allergen Reactions  . Ivp Dye [Iodinated Diagnostic Agents] Hives  . Dolobid [Diflunisal]   . Percocet [Oxycodone-Acetaminophen] Hives  . Codeine Anxiety  . Codeine Sulfate Anxiety    Past Medical History:  Diagnosis Date  . Migraine   . Prediabetes     Past Surgical History:  Procedure Laterality Date  . bladder tack    . BONE EXOSTOSIS EXCISION     removal from left femur(benign)  . PARTIAL HYSTERECTOMY    . removal of bone spurs     form 4th and 5th toes  . TERATOMA EXCISION     bilateral  . TOTAL HIP ARTHROPLASTY     right hip  . UMBILICAL HERNIA REPAIR      Social History   Socioeconomic History  . Marital status: Married    Spouse name: Not on file  . Number of children: Not on file  . Years of education:  Not on file  . Highest education level: Not on file  Social Needs  . Financial resource strain: Not on file  . Food insecurity - worry: Not on file  . Food insecurity - inability: Not on file  . Transportation needs - medical: Not on file  . Transportation needs - non-medical: Not on file  Occupational History  . Not on file  Tobacco Use  . Smoking status: Never Smoker  . Smokeless tobacco: Never Used  Substance and Sexual Activity  . Alcohol use: Yes    Comment: socially  . Drug use: No  . Sexual activity: Yes    Partners: Male  Other Topics Concern  . Not on file  Social History Narrative  . Not on file    Family History  Problem Relation Age of Onset  . Lung cancer Mother        deceased at age 71  . Heart attack Maternal Grandfather   . Diabetes Paternal Grandfather   . Hyperlipidemia Paternal Grandfather   . Neurologic Disorder Father        Primary Progressive  Aphasia  . Hypertension Paternal Grandfather   . Stroke Neg Hx     Outpatient Encounter Medications as of 03/20/2017  Medication Sig  . Calcium Carb-Cholecalciferol (CALCIUM-VITAMIN D) 500-400 MG-UNIT TABS Take 1 tablet by mouth.  . methocarbamol (ROBAXIN) 500 MG tablet Take 1 tablet (500 mg total) by mouth 3 (three) times daily.  . [DISCONTINUED] predniSONE (DELTASONE) 50 MG tablet Take one tablet for 5 days. (Patient not taking: Reported on 12/09/2016)  . [DISCONTINUED] Vitamin D, Ergocalciferol, (DRISDOL) 50000 units CAPS capsule Take 1 capsule (50,000 Units total) by mouth every 7 (seven) days.   No facility-administered encounter medications on file as of 03/20/2017.           Objective:   Physical Exam  Constitutional: She is oriented to person, place, and time. She appears well-developed and well-nourished.  HENT:  Head: Normocephalic and atraumatic.  Eyes: Conjunctivae and EOM are normal.  Cardiovascular: Normal rate.  Pulmonary/Chest: Effort normal.  Musculoskeletal:  Right hip with  normal flexion extension and abduction.  Strength is 5 out of 5 at the hip knee and ankle.  Patellar reflexes are 1+ bilaterally.  Just mildly tender over the greater trochanter.  Strength with abduction gets resistance is good.  Neurological: She is alert and oriented to person, place, and time.  Skin: Skin is dry. No pallor.  Psychiatric: She has a normal mood and affect. Her behavior is normal.  Vitals reviewed.         Assessment & Plan:  Right hip pain status post hip replacement status post fall 5 weeks ago-we will get x-ray to evaluate further for any injury or damage to the prosthesis.  If negative then recommend some exercises for bursitis.  Okay to use an anti-inflammatory as needed as well as ice and stretching.  IBS -will order food panel testing per patient request.  Will call with results once available so she can try limiting some triggers from her diet.

## 2017-03-30 LAB — FOOD ALLERGY PROFILE W/ REFLEXES
Allergen, Salmon, f41: <0.1 kU/L
CLASS: 0
CLASS: 0
CLASS: 0
CLASS: 0
CLASS: 0
CLASS: 0
CLASS: 0
CLASS: 0
CLASS: 0
CLASS: 0
CLASS: 0
CLASS: 0
Cashew IgE: <0.1 kU/L
Class: 0
Class: 0
Class: 0
Egg White IgE: <0.1 kU/L
Fish Cod: <0.1 kU/L
Peanut IgE: <0.1 kU/L
Scallop IgE: <0.1 kU/L
Sesame Seed f10: <0.1 kU/L
Soybean IgE: <0.1 kU/L
Tuna IgE: <0.1 kU/L

## 2017-03-30 LAB — FOOD SPECIFIC IGG ALLERGY( ADULT)
CACAO (CHOCOLATE)IGG: 2.9 ug/mL — AB (ref <2.0)
COFFEE (F221) IGG: 2.5 ug/mL — AB (ref <2.0)
CORN IGG: 2.5 ug/mL — AB (ref <2.0)
Casein IgE: 3.3 ug/mL — ABNORMAL HIGH (ref <2.0)
Codfish/Scrod IgG: <2 ug/mL (ref <2.0)
Egg white, IgG: 3.2 ug/mL — ABNORMAL HIGH (ref <2.0)
PEANUT (F13) IGG: <2 ug/mL (ref <2.0)
Soybean IgG: <2 ug/mL (ref <2.0)
Tomato IgG: <2 ug/mL (ref <2.0)
Wheat IgG: 3.3 ug/mL — ABNORMAL HIGH (ref <2.0)
YEAST (F45) IGG: 2.3 ug/mL — AB (ref <2.0)

## 2017-03-30 LAB — INTERPRETATION:

## 2017-05-20 ENCOUNTER — Encounter: Payer: Self-pay | Admitting: Physician Assistant

## 2017-05-20 ENCOUNTER — Ambulatory Visit (INDEPENDENT_AMBULATORY_CARE_PROVIDER_SITE_OTHER): Payer: 59 | Admitting: Physician Assistant

## 2017-05-20 ENCOUNTER — Other Ambulatory Visit: Payer: Self-pay | Admitting: Physician Assistant

## 2017-05-20 VITALS — BP 121/61 | HR 77 | Ht 63.0 in | Wt 165.0 lb

## 2017-05-20 DIAGNOSIS — R221 Localized swelling, mass and lump, neck: Secondary | ICD-10-CM

## 2017-05-20 DIAGNOSIS — Z13220 Encounter for screening for lipoid disorders: Secondary | ICD-10-CM | POA: Diagnosis not present

## 2017-05-20 DIAGNOSIS — D229 Melanocytic nevi, unspecified: Secondary | ICD-10-CM | POA: Diagnosis not present

## 2017-05-20 DIAGNOSIS — Z0000 Encounter for general adult medical examination without abnormal findings: Secondary | ICD-10-CM | POA: Diagnosis not present

## 2017-05-20 DIAGNOSIS — Z131 Encounter for screening for diabetes mellitus: Secondary | ICD-10-CM | POA: Diagnosis not present

## 2017-05-20 DIAGNOSIS — Z1231 Encounter for screening mammogram for malignant neoplasm of breast: Secondary | ICD-10-CM

## 2017-05-20 LAB — COMPLETE METABOLIC PANEL WITH GFR
AG RATIO: 1.7 (calc) (ref 1.0–2.5)
ALKALINE PHOSPHATASE (APISO): 58 U/L (ref 33–115)
ALT: 13 U/L (ref 6–29)
AST: 16 U/L (ref 10–35)
Albumin: 4.5 g/dL (ref 3.6–5.1)
BUN: 12 mg/dL (ref 7–25)
CO2: 28 mmol/L (ref 20–32)
CREATININE: 0.84 mg/dL (ref 0.50–1.10)
Calcium: 9.6 mg/dL (ref 8.6–10.2)
Chloride: 105 mmol/L (ref 98–110)
GFR, Est African American: 95 mL/min/{1.73_m2} (ref >=60)
GFR, Est Non African American: 82 mL/min/{1.73_m2} (ref >=60)
Globulin: 2.7 g/dL (calc) (ref 1.9–3.7)
Glucose, Bld: 92 mg/dL (ref 65–99)
POTASSIUM: 4.4 mmol/L (ref 3.5–5.3)
Sodium: 141 mmol/L (ref 135–146)
Total Bilirubin: 0.4 mg/dL (ref 0.2–1.2)
Total Protein: 7.2 g/dL (ref 6.1–8.1)

## 2017-05-20 LAB — CBC WITH DIFFERENTIAL/PLATELET
BASOS PCT: 0.3 %
Basophils Absolute: 19 cells/uL (ref 0–200)
EOS PCT: 1.1 %
Eosinophils Absolute: 68 cells/uL (ref 15–500)
HCT: 39 % (ref 35.0–45.0)
HEMOGLOBIN: 12.8 g/dL (ref 11.7–15.5)
Lymphs Abs: 1835 cells/uL (ref 850–3900)
MCH: 28.4 pg (ref 27.0–33.0)
MCHC: 32.8 g/dL (ref 32.0–36.0)
MCV: 86.5 fL (ref 80.0–100.0)
MONOS PCT: 8.3 %
MPV: 10.1 fL (ref 7.5–12.5)
NEUTROS ABS: 3763 {cells}/uL (ref 1500–7800)
Neutrophils Relative %: 60.7 %
PLATELETS: 252 10*3/uL (ref 140–400)
RBC: 4.51 10*6/uL (ref 3.80–5.10)
RDW: 12.5 % (ref 11.0–15.0)
Total Lymphocyte: 29.6 %
WBC mixed population: 515 cells/uL (ref 200–950)
WBC: 6.2 10*3/uL (ref 3.8–10.8)

## 2017-05-20 LAB — TSH: TSH: 1.46 m[IU]/L

## 2017-05-20 LAB — LIPID PANEL W/REFLEX DIRECT LDL
CHOL/HDL RATIO: 3.9 (calc) (ref <5.0)
CHOLESTEROL: 199 mg/dL (ref <200)
HDL: 51 mg/dL (ref >50)
LDL CHOLESTEROL (CALC): 123 mg/dL — AB
NON-HDL CHOLESTEROL (CALC): 148 mg/dL — AB (ref <130)
TRIGLYCERIDES: 134 mg/dL (ref <150)

## 2017-05-20 NOTE — Progress Notes (Signed)
Subjective:     Wendy Anderson is a 47 y.o. female and is here for a comprehensive physical exam. The patient reports problems - pt is concerned with what she thought was a blood blister on left lower abdomen for last 4 months. she has tried to "bust it open" but it keeps coming back. she is concerned about nodular melanoma. no pain. .  Pt is doing better overall on her IGG diet and not eating processed foods.   Social History   Socioeconomic History  . Marital status: Married    Spouse name: Not on file  . Number of children: Not on file  . Years of education: Not on file  . Highest education level: Not on file  Social Needs  . Financial resource strain: Not on file  . Food insecurity - worry: Not on file  . Food insecurity - inability: Not on file  . Transportation needs - medical: Not on file  . Transportation needs - non-medical: Not on file  Occupational History  . Not on file  Tobacco Use  . Smoking status: Never Smoker  . Smokeless tobacco: Never Used  Substance and Sexual Activity  . Alcohol use: Yes    Comment: socially  . Drug use: No  . Sexual activity: Yes    Partners: Male  Other Topics Concern  . Not on file  Social History Narrative  . Not on file   Health Maintenance  Topic Date Due  . PAP SMEAR  03/24/2016  . HIV Screening  05/20/2018 (Originally [birth year]1983)  . TETANUS/TDAP  03/25/2019  . INFLUENZA VACCINE  Completed    The following portions of the patient's history were reviewed and updated as appropriate: allergies, current medications, past family history, past medical history, past social history, past surgical history and problem list.  Review of Systems Pertinent items noted in HPI and remainder of comprehensive ROS otherwise negative.   Objective:    BP 121/61   Pulse 77   Ht 5\' 3"  (1.6 m)   Wt 165 lb (74.8 kg)   BMI 29.23 kg/m  General appearance: alert, cooperative and appears stated age Head: Normocephalic, without obvious  abnormality, atraumatic Eyes: conjunctivae/corneas clear. PERRL, EOM's intact. Fundi benign. Ears: normal TM's and external ear canals both ears Nose: Nares normal. Septum midline. Mucosa normal. No drainage or sinus tenderness. Throat: lips, mucosa, and tongue normal; teeth and gums normal Neck: no adenopathy, no carotid bruit, no JVD, supple, symmetrical, trachea midline and thyroid not enlarged, symmetric, no tenderness/mass/nodules no nodules but fullness to thyroid.  Back: symmetric, no curvature. ROM normal. No CVA tenderness. Lungs: clear to auscultation bilaterally Heart: regular rate and rhythm, S1, S2 normal, no murmur, click, rub or gallop Abdomen: soft, non-tender; bowel sounds normal; no masses,  no organomegaly Extremities: extremities normal, atraumatic, no cyanosis or edema Pulses: 2+ and symmetric Skin: Skin color, texture, turgor normal. No rashes or lesions 42mm hyperpigmented lesion on left lower abdomen that appears irritated.  Lymph nodes: Cervical, supraclavicular, and axillary nodes normal. Neurologic: Alert and oriented X 3, normal strength and tone. Normal symmetric reflexes. Normal coordination and gait    Assessment:    Healthy female exam.      Plan:    Marland KitchenMarland KitchenDackeri was seen today for annual exam.  Diagnoses and all orders for this visit:  Routine physical examination -     Lipid Panel w/reflex Direct LDL -     COMPLETE METABOLIC PANEL WITH GFR -     CBC with  Differential/Platelet -     TSH  Screening for lipid disorders -     Lipid Panel w/reflex Direct LDL  Screening for diabetes mellitus -     COMPLETE METABOLIC PANEL WITH GFR  Neck fullness -     TSH -     US SOFT TISSUE HEAD & NECK (NON-THYROID)  Suspicious nevus -     Dermatology pathology  .Marland Kitchen Depression screen [facility] 2/9 05/20/2017 12/02/2016  Decreased Interest 0 0  Down, Depressed, Hopeless 0 0  PHQ - 2 Score 0 0   .Marland Kitchen Discussed 150 minutes of exercise a week.  Encouraged vitamin D 1000  units and Calcium 1300mg  or 4 servings of dairy a day.  Pt gets pap done at GYN. Will call to get report.  Mammogram scheduled.  Vaccines up to date.  Labs ordered.   Will check TSH/Ultrasound to follow up thyroid enlargement.   Shave Biopsy Procedure Note  Pre-operative Diagnosis: Suspicious lesion  Post-operative Diagnosis: same  Locations Left lower abdomen  Indications: irritated/malignant concern  Anesthesia: Lidocaine 1% with epinephrine without added sodium bicarbonate  Procedure Details  History of allergy to iodine: no  Patient informed of the risks (including bleeding and infection) and benefits of the  procedure and Verbal informed consent obtained.  The lesion and surrounding area were given a sterile prep using chlorhexidine and draped in the usual sterile fashion. A scalpel was used to shave an area of skin approximately 3cm by 3cm.  Hemostasis achieved with alumuninum chloride. Antibiotic ointment and a sterile dressing applied.  The specimen was sent for pathologic examination. The patient tolerated the procedure well.  EBL: scant  Condition: Stable  Complications: none.  Plan: 1. Instructed to keep the wound dry and covered for 24-48h and clean thereafter. 2. Warning signs of infection were reviewed.   3. Recommended that the patient use OTC acetaminophen as needed for pain.   See After Visit Summary for Counseling Recommendations

## 2017-05-20 NOTE — Patient Instructions (Signed)

## 2017-05-22 NOTE — Progress Notes (Signed)
Call pt: cholesterol is down. Total under 100. HDL 51 which is good. LDL 123 which is ok and since no other risk factors very good. TG much better.  Thyroid normal range.  Kidney, liver, glucose look great.   Labs look really good. Keep up the good work.

## 2017-05-22 NOTE — Progress Notes (Signed)
Call pt: derm path shows benign epidermal hyperplasia. Benign(just irritated skin) report stated likely previous injury occurred here.

## 2017-05-24 ENCOUNTER — Encounter: Payer: Self-pay | Admitting: Physician Assistant

## 2017-05-24 DIAGNOSIS — R221 Localized swelling, mass and lump, neck: Secondary | ICD-10-CM | POA: Insufficient documentation

## 2017-05-27 ENCOUNTER — Ambulatory Visit (INDEPENDENT_AMBULATORY_CARE_PROVIDER_SITE_OTHER): Payer: 59

## 2017-05-27 DIAGNOSIS — Z1231 Encounter for screening mammogram for malignant neoplasm of breast: Secondary | ICD-10-CM | POA: Diagnosis not present

## 2017-05-27 DIAGNOSIS — R221 Localized swelling, mass and lump, neck: Secondary | ICD-10-CM

## 2017-05-27 NOTE — Progress Notes (Signed)
Call pt: normal mammogram. Follow up in 1 year.

## 2017-05-27 NOTE — Progress Notes (Signed)
Call pt: benign appearing cervical lymph node of right side and benign appearing left sided thyroid cyst. Neck fullness is like subcutaneous adipose tissue.

## 2017-06-03 ENCOUNTER — Encounter: Payer: Self-pay | Admitting: Physician Assistant

## 2017-06-08 ENCOUNTER — Encounter: Payer: Self-pay | Admitting: Physician Assistant

## 2017-06-08 DIAGNOSIS — M25551 Pain in right hip: Secondary | ICD-10-CM

## 2017-06-08 DIAGNOSIS — Z96641 Presence of right artificial hip joint: Secondary | ICD-10-CM

## 2017-06-09 ENCOUNTER — Encounter: Payer: Self-pay | Admitting: Physician Assistant

## 2017-06-09 DIAGNOSIS — R221 Localized swelling, mass and lump, neck: Secondary | ICD-10-CM

## 2017-06-09 DIAGNOSIS — M25551 Pain in right hip: Secondary | ICD-10-CM | POA: Insufficient documentation

## 2017-06-09 DIAGNOSIS — E041 Nontoxic single thyroid nodule: Secondary | ICD-10-CM

## 2017-06-09 DIAGNOSIS — R590 Localized enlarged lymph nodes: Secondary | ICD-10-CM

## 2017-06-11 ENCOUNTER — Other Ambulatory Visit: Payer: Self-pay | Admitting: *Deleted

## 2017-06-11 ENCOUNTER — Other Ambulatory Visit: Payer: 59

## 2017-06-11 MED ORDER — PREDNISONE 50 MG PO TABS: ORAL_TABLET | ORAL | 0 refills | Status: DC

## 2017-06-11 MED ORDER — DIPHENHYDRAMINE HCL 50 MG PO TABS: ORAL_TABLET | ORAL | 0 refills | Status: DC

## 2017-06-17 ENCOUNTER — Encounter: Payer: Self-pay | Admitting: Physician Assistant

## 2017-06-18 ENCOUNTER — Ambulatory Visit (INDEPENDENT_AMBULATORY_CARE_PROVIDER_SITE_OTHER): Payer: 59

## 2017-06-18 ENCOUNTER — Encounter: Payer: Self-pay | Admitting: Physician Assistant

## 2017-06-18 DIAGNOSIS — R221 Localized swelling, mass and lump, neck: Secondary | ICD-10-CM

## 2017-06-18 DIAGNOSIS — R590 Localized enlarged lymph nodes: Secondary | ICD-10-CM

## 2017-06-18 DIAGNOSIS — E041 Nontoxic single thyroid nodule: Secondary | ICD-10-CM

## 2017-06-18 MED ORDER — IOPAMIDOL (ISOVUE-300) INJECTION 61%
100.0000 mL | Freq: Once | INTRAVENOUS | Status: AC | PRN
  Administered 2017-06-18: 75 mL via INTRAVENOUS

## 2017-06-18 NOTE — Progress Notes (Signed)
Great news! Left 36mm thyroid cyst but no other abnormalities.

## 2017-12-07 ENCOUNTER — Ambulatory Visit (INDEPENDENT_AMBULATORY_CARE_PROVIDER_SITE_OTHER): Payer: 59 | Admitting: Physician Assistant

## 2017-12-07 ENCOUNTER — Encounter: Payer: Self-pay | Admitting: Physician Assistant

## 2017-12-07 VITALS — BP 139/92 | HR 84 | Temp 97.9°F | Ht 63.0 in | Wt 162.0 lb

## 2017-12-07 DIAGNOSIS — R3989 Other symptoms and signs involving the genitourinary system: Secondary | ICD-10-CM

## 2017-12-07 DIAGNOSIS — R102 Pelvic and perineal pain: Secondary | ICD-10-CM

## 2017-12-07 DIAGNOSIS — R1084 Generalized abdominal pain: Secondary | ICD-10-CM

## 2017-12-07 DIAGNOSIS — R350 Frequency of micturition: Secondary | ICD-10-CM | POA: Diagnosis not present

## 2017-12-07 DIAGNOSIS — R1030 Lower abdominal pain, unspecified: Secondary | ICD-10-CM

## 2017-12-07 LAB — POCT URINALYSIS DIPSTICK
BILIRUBIN UA: NEGATIVE
GLUCOSE UA: NEGATIVE
Ketones, UA: NEGATIVE
LEUKOCYTES UA: NEGATIVE
Nitrite, UA: NEGATIVE
Protein, UA: NEGATIVE
Spec Grav, UA: <=1.005 — AB (ref 1.010–1.025)
Urobilinogen, UA: 0.2 E.U./dL
pH, UA: 6 (ref 5.0–8.0)

## 2017-12-07 MED ORDER — PHENAZOPYRIDINE HCL 200 MG PO TABS: 200.0000 mg | ORAL_TABLET | Freq: Three times a day (TID) | ORAL | 0 refills | Status: AC

## 2017-12-07 NOTE — Progress Notes (Signed)
Subjective:    Patient ID: Wendy Anderson, female    DOB: 06/13/1970, 47 y.o.   MRN: [id number]  HPI  Patient is a 47 year old female who presents to the clinic with lower quadrant pain with recent history of fever, chills, body aches.  In hindsight on Thursday she felt like she pulled a muscle in her back.  She did take an anti-inflammatory and felt better the next day.  She did notice her urine was a little darker so she increase fluids.  By Friday morning her whole entire back was hurting and she took Aleve with no relief.  Friday night her urine had turned dark orange.  Saturday pain radiated from back into the lower abdomen and she felt suddenly ill.  She had a fever of 101 and chills throughout the day. She took theraflu. Sunday she started to feel a little better.  Monday morning she feels better except for the persistent lower abdominal pain.  She denies any painful urination.  She has been going a little more frequently in the color has been dark.  She has not read any more fevers.  She denies any nausea or vomiting.  The only thing new that she is started is a supplement called Diindylylmethane(DIM).  Started 3 weeks ago and significantly helping with hot flashes.   .. Active Ambulatory Problems    Diagnosis Date Noted  . Status post right hip replacement 2012 [facility] 04/11/2013  . 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 Prediabetes 04/11/2013  . Tendinitis of left rotator cuff 04/11/2013  . Gluten intolerance 04/11/2013  . Vitamin D insufficiency 04/12/2013  . Hemorrhoids 04/12/2013  . Insomnia 04/12/2013  . Degenerative disc disease, cervical 04/12/2013  . Hematuria 04/12/2013  . Hyperlipidemia 11/11/2013  . Essential hypertension 12/07/2014  . Chest discomfort 03/28/2015  . Right shoulder pain 07/20/2015  . Stress at home 02/27/2016  . Anxiety state 02/27/2016  . 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 Cervical herniated disc 12/02/2016  . Suspicious nevus 05/20/2017  . Neck fullness  05/24/2017  . Right hip pain 06/09/2017  . Thyroid cyst 06/18/2017   Resolved Ambulatory Problems    Diagnosis Date Noted  . No Resolved Ambulatory Problems   Past Medical History:  Diagnosis Date  . Migraine       Review of Systems    see HPI.  Objective:   Physical Exam  Constitutional: She is oriented to person, place, and time. She appears well-developed and well-nourished.  HENT:  Head: Normocephalic and atraumatic.  Right Ear: External ear normal.  Left Ear: External ear normal.  Nose: Nose normal.  Mouth/Throat: Oropharynx is clear and moist.  Eyes: Conjunctivae and EOM are normal.  Cardiovascular: Normal rate and regular rhythm.  Pulmonary/Chest: Effort normal and breath sounds normal.  No CVA tenderness.   Abdominal: Soft. Bowel sounds are normal. She exhibits no mass. There is no rebound and no guarding.  Mild to moderate lower right and left quadrant tenderness with severe tenderness over suprapubic area.   Lymphadenopathy:    She has no cervical adenopathy.  Neurological: She is alert and oriented to person, place, and time.  Psychiatric: She has a normal mood and affect. Her behavior is normal.          Assessment & Plan:  Marland KitchenMarland KitchenDiagnoses and all orders for this visit:  Suprapubic pain, acute -     phenazopyridine (PYRIDIUM) 200 MG tablet; Take 1 tablet (200 mg total) by mouth 3 (three) times daily for 2 days.  Urinary frequency -  POCT Urinalysis Dipstick -     Urine Culture  Abnormal urine color -     POCT Urinalysis Dipstick -     Urine Culture  Lower abdominal pain   Symptoms and physical exam match most closely with UTI.  UA dipstick was only positive for blood only. Will culture. Could be an inflamed bladder.  Will start pyridium for next 3 days waiting on culture. Increase fluids. Follow up as needed.   Pt called back next day and pain is worse but culture not back. Start cipro and wait for culture. Added STAT CBC and BMP.   Urine  culture came back and negative for bacterial growth. Stop cipro. This is not UTI.  Possible kidney stones vs colon infection inflammation. Will get CT of abdomen and pelvis.

## 2017-12-08 ENCOUNTER — Encounter: Payer: Self-pay | Admitting: Physician Assistant

## 2017-12-08 DIAGNOSIS — R102 Pelvic and perineal pain: Secondary | ICD-10-CM

## 2017-12-08 LAB — CBC WITH DIFFERENTIAL/PLATELET
Basophils Absolute: 22 cells/uL (ref 0–200)
Basophils Relative: 0.4 %
EOS PCT: 1.1 %
Eosinophils Absolute: 61 cells/uL (ref 15–500)
HEMATOCRIT: 36.2 % (ref 35.0–45.0)
HEMOGLOBIN: 11.9 g/dL (ref 11.7–15.5)
LYMPHS ABS: 1485 {cells}/uL (ref 850–3900)
MCH: 28.4 pg (ref 27.0–33.0)
MCHC: 32.9 g/dL (ref 32.0–36.0)
MCV: 86.4 fL (ref 80.0–100.0)
MPV: 10.2 fL (ref 7.5–12.5)
Monocytes Relative: 12.2 %
NEUTROS ABS: 3262 {cells}/uL (ref 1500–7800)
Neutrophils Relative %: 59.3 %
Platelets: 217 10*3/uL (ref 140–400)
RBC: 4.19 10*6/uL (ref 3.80–5.10)
RDW: 12.3 % (ref 11.0–15.0)
Total Lymphocyte: 27 %
WBC: 5.5 10*3/uL (ref 3.8–10.8)
WBCMIX: 671 {cells}/uL (ref 200–950)

## 2017-12-08 LAB — BASIC METABOLIC PANEL WITH GFR
BUN: 12 mg/dL (ref 7–25)
CALCIUM: 9.4 mg/dL (ref 8.6–10.2)
CO2: 29 mmol/L (ref 20–32)
CREATININE: 0.7 mg/dL (ref 0.50–1.10)
Chloride: 103 mmol/L (ref 98–110)
GFR, EST NON AFRICAN AMERICAN: 102 mL/min/{1.73_m2} (ref >=60)
GFR, Est African American: 118 mL/min/{1.73_m2} (ref >=60)
Glucose, Bld: 94 mg/dL (ref 65–99)
Potassium: 5.1 mmol/L (ref 3.5–5.3)
Sodium: 139 mmol/L (ref 135–146)

## 2017-12-08 LAB — URINE CULTURE
MICRO NUMBER: 91109929
RESULT: NO GROWTH
SPECIMEN QUALITY: ADEQUATE

## 2017-12-08 MED ORDER — CIPROFLOXACIN HCL 500 MG PO TABS: 500.0000 mg | ORAL_TABLET | Freq: Two times a day (BID) | ORAL | 0 refills | Status: DC

## 2017-12-08 NOTE — Addendum Note (Signed)
Addended by: Donella Stade on: 12/08/2017 12:28 PM   Modules accepted: Orders

## 2017-12-09 NOTE — Telephone Encounter (Signed)
Call pt: no WBC elevation. Kidney, liver look great. Negative urine culture. No UTI. We need to get a CT scan to look for kidney stones vs colon inflammation. I will place to have done downstairs today.

## 2017-12-09 NOTE — Progress Notes (Signed)
completeley negative urine culture. STOP macrobid.

## 2017-12-10 ENCOUNTER — Other Ambulatory Visit: Payer: 59

## 2017-12-10 ENCOUNTER — Telehealth: Payer: Self-pay

## 2017-12-10 MED ORDER — PREDNISONE 10 MG PO TABS: ORAL_TABLET | ORAL | 0 refills | Status: DC

## 2017-12-10 MED ORDER — DIPHENHYDRAMINE HCL 50 MG PO TABS: ORAL_TABLET | ORAL | 0 refills | Status: DC

## 2017-12-10 NOTE — Telephone Encounter (Signed)
Prescriptions sent to [facility].

## 2017-12-10 NOTE — Telephone Encounter (Signed)
Pt approved for imaging but is in need of pre-med.   Per imaging:  50 mg Prednisone PO 13, 7, and 1 hour before injection  50 mg Diphenhydramine PO within 1 hour of injection    If you are ok to approve for [facility] pt, please send RX to pharmacy and let me know so that I can contact imaging to get pt scheduled  Thanks!

## 2017-12-10 NOTE — Telephone Encounter (Signed)
Pt and imaging department advised.   Thanks!

## 2017-12-11 ENCOUNTER — Ambulatory Visit (INDEPENDENT_AMBULATORY_CARE_PROVIDER_SITE_OTHER): Payer: 59

## 2017-12-11 DIAGNOSIS — K5730 Diverticulosis of large intestine without perforation or abscess without bleeding: Secondary | ICD-10-CM

## 2017-12-11 MED ORDER — IOPAMIDOL (ISOVUE-300) INJECTION 61%
100.0000 mL | Freq: Once | INTRAVENOUS | Status: AC | PRN
  Administered 2017-12-11: 100 mL via INTRAVENOUS

## 2017-12-11 NOTE — Progress Notes (Signed)
CT shows no acute findings which is great. No colon infection which is great. You do have evidence of some pockets which can cause infection at some point called diverticulosis.   No cause of pain.   How are symptoms?

## 2017-12-21 ENCOUNTER — Other Ambulatory Visit: Payer: Self-pay | Admitting: Physician Assistant

## 2017-12-21 DIAGNOSIS — R350 Frequency of micturition: Secondary | ICD-10-CM

## 2017-12-21 DIAGNOSIS — R3121 Asymptomatic microscopic hematuria: Secondary | ICD-10-CM

## 2017-12-21 DIAGNOSIS — R102 Pelvic and perineal pain: Secondary | ICD-10-CM

## 2017-12-21 DIAGNOSIS — R3989 Other symptoms and signs involving the genitourinary system: Secondary | ICD-10-CM

## 2017-12-21 NOTE — Progress Notes (Signed)
Urine orders placed per MyChart message to recheck

## 2017-12-22 LAB — URINALYSIS, ROUTINE W REFLEX MICROSCOPIC
BILIRUBIN URINE: NEGATIVE
Bacteria, UA: NONE SEEN /HPF
GLUCOSE, UA: NEGATIVE
Hyaline Cast: NONE SEEN /LPF
Ketones, ur: NEGATIVE
Leukocytes, UA: NEGATIVE
NITRITE: NEGATIVE
PROTEIN: NEGATIVE
Specific Gravity, Urine: 1.021 (ref 1.001–1.03)
Squamous Epithelial / LPF: NONE SEEN /HPF (ref <=5)
WBC, UA: NONE SEEN /HPF (ref 0–5)
pH: 6.5 (ref 5.0–8.0)

## 2017-12-22 LAB — URINE CULTURE
MICRO NUMBER:: 91173335
SPECIMEN QUALITY:: ADEQUATE

## 2017-12-22 NOTE — Addendum Note (Signed)
Addended by: Donella Stade on: 12/22/2017 03:49 PM   Modules accepted: Orders

## 2017-12-22 NOTE — Progress Notes (Signed)
Done

## 2017-12-22 NOTE — Progress Notes (Signed)
Call pt: continues to be trace blood in urine. I would like you to be evaluated by urology. Likely normal variant but remains to be blood in your urine;therfore, needs work up.

## 2018-09-03 ENCOUNTER — Ambulatory Visit (INDEPENDENT_AMBULATORY_CARE_PROVIDER_SITE_OTHER): Payer: No Typology Code available for payment source

## 2018-09-03 ENCOUNTER — Encounter: Payer: Self-pay | Admitting: Family Medicine

## 2018-09-03 ENCOUNTER — Other Ambulatory Visit: Payer: Self-pay

## 2018-09-03 ENCOUNTER — Ambulatory Visit: Payer: No Typology Code available for payment source | Admitting: Family Medicine

## 2018-09-03 VITALS — BP 138/88 | HR 76 | Ht 63.0 in | Wt 169.0 lb

## 2018-09-03 DIAGNOSIS — M79671 Pain in right foot: Secondary | ICD-10-CM

## 2018-09-03 DIAGNOSIS — M67479 Ganglion, unspecified ankle and foot: Secondary | ICD-10-CM | POA: Diagnosis not present

## 2018-09-03 MED ORDER — AMOXICILLIN 500 MG PO CAPS: ORAL_CAPSULE | ORAL | 3 refills | Status: DC

## 2018-09-03 MED ORDER — DICLOFENAC SODIUM 1 % TD GEL: 2.0000 g | Freq: Four times a day (QID) | TRANSDERMAL | 11 refills | Status: DC

## 2018-09-03 NOTE — Patient Instructions (Addendum)
Thank you for coming in today. Use the cam walker boot until Monday and then as needed for comfort.  USe diclofenac gel for pain as needed.  You may also find that an arch strap is helpful. (arch bandange) Do not drive with the boot on your foot.  Ok to remove the boot to drive.   Recheck in 2 weeks.  Return sooner if needed    Ganglion Cyst  A ganglion cyst is a non-cancerous, fluid-filled lump that occurs near a joint or tendon. The cyst grows out of a joint or the lining of a tendon. Ganglion cysts most often develop in the hand or wrist, but they can also develop in the shoulder, elbow, hip, knee, ankle, or foot. Ganglion cysts are ball-shaped or egg-shaped. Their size can range from the size of a pea to larger than a grape. Increased activity may cause the cyst to get bigger because more fluid starts to build up. What are the causes? The exact cause of this condition is not known, but it may be related to:  Inflammation or irritation around the joint.  An injury.  Repetitive movements or overuse.  Arthritis. What increases the risk? You are more likely to develop this condition if:  You are a woman.  You are 33-15 years old. What are the signs or symptoms? The main symptom of this condition is a lump. It most often appears on the hand or wrist. In many cases, there are no other symptoms, but a cyst can sometimes cause:  Tingling.  Pain.  Numbness.  Muscle weakness.  Weak grip.  Less range of motion in a joint. How is this diagnosed? Ganglion cysts are usually diagnosed based on a physical exam. Your health care provider will feel the lump and may shine a light next to it. If it is a ganglion cyst, the light will likely shine through it. Your health care provider may order an X-ray, ultrasound, or MRI to rule out other conditions. How is this treated? Ganglion cysts often go away on their own without treatment. If you have pain or other symptoms, treatment may be  needed. Treatment is also needed if the ganglion cyst limits your movement or if it gets infected. Treatment may include:  Wearing a brace or splint on your wrist or finger.  Taking anti-inflammatory medicine.  Having fluid drained from the lump with a needle (aspiration).  Getting a steroid injected into the joint.  Having surgery to remove the ganglion cyst.  Placing a pad on your shoe or wearing shoes that will not rub against the cyst if it is on your foot. Follow these instructions at home:  Do not press on the ganglion cyst, poke it with a needle, or hit it.  Take over-the-counter and prescription medicines only as told by your health care provider.  If you have a brace or splint: ? Wear it as told by your health care provider. ? Remove it as told by your health care provider. Ask if you need to remove it when you take a shower or a bath.  Watch your ganglion cyst for any changes.  Keep all follow-up visits as told by your health care provider. This is important. Contact a health care provider if:  Your ganglion cyst becomes larger or more painful.  You have pus coming from the lump.  You have weakness or numbness in the affected area.  You have a fever or chills. Get help right away if:  You have a  fever and have any of these in the cyst area: ? Increased redness. ? Red streaks. ? Swelling. Summary  A ganglion cyst is a non-cancerous, fluid-filled lump that occurs near a joint or tendon.  Ganglion cysts most often develop in the hand or wrist, but they can also develop in the shoulder, elbow, hip, knee, ankle, or foot.  Ganglion cysts often go away on their own without treatment. This information is not intended to replace advice given to you by your health care provider. Make sure you discuss any questions you have with your health care provider. Document Released: 03/07/2000 Document Revised: 11/07/2016 Document Reviewed: 11/07/2016 Elsevier Interactive  Patient Education  2019 [employer]    Seroma A seroma is a collection of fluid on the body that looks like swelling or a mass. Seromas form where tissue has been injured or cut. Seromas vary in size. Some are small and painless. Others may become large and cause pain or discomfort. Many seromas go away on their own as the fluid is naturally absorbed by the body, and some seromas need to be drained. What are the causes? Seromas form as the result of damage to tissue or the removal of tissue. This tissue damage may occur during surgery or because of an injury or trauma. When tissue is disrupted or removed, empty space is created. The body's natural defense system (immune system) causes fluid to enter the empty space and form a seroma. What are the signs or symptoms? Symptoms of this condition include:  Swelling at the site of a surgical cut (incision) or an injury.  Drainage of clear fluid at the surgery or injury site.  Discomfort or pain. How is this diagnosed? This condition is diagnosed based on your symptoms, your medical history, and a physical exam. During the exam, your health care provider will press on the seroma. You may also have tests, including:  Blood tests.  Imaging tests, such as an ultrasound or CT scan. How is this treated? Some seromas go away (resolve) on their own. Your health care provider may monitor you to make sure the seroma does not cause any complications. If your seroma does not resolve on its own, treatment may include:  Using a needle to drain the fluid from the seroma (needle aspiration).  Inserting a flexible tube (catheter) to drain the fluid.  Applying a bandage (dressing), such as an elastic bandage or binder.  Antibiotic medicines, if the seroma becomes infected. In rare cases, surgery may be done to remove the seroma and repair the area. Follow these instructions at home:   If you were prescribed an antibiotic medicine, take it as told by  your health care provider. Do not stop taking the antibiotic even if you start to feel better.  Return to your normal activities as told by your health care provider. Ask your health care provider what activities are safe for you.  Take over-the-counter and prescription medicines only as told by your health care provider.  Check your seroma every day for signs of infection. Check for: ? Redness or pain. ? Fluid or pus. ? More swelling. ? Warmth.  Keep all follow-up visits as told by your health care provider. This is important. Contact a health care provider if:  You have a fever.  You have redness or pain at the site of the seroma.  You have fluid or pus coming from the seroma.  Your seroma is more swollen or is getting bigger.  Your seroma is warm  to the touch. This information is not intended to replace advice given to you by your health care provider. Make sure you discuss any questions you have with your health care provider. Document Released: 07/05/2012 Document Revised: 12/21/2015 Document Reviewed: 12/21/2015 Elsevier Interactive Patient Education  2019 [employer].

## 2018-09-03 NOTE — Progress Notes (Signed)
Wendy Anderson is a 48 y.o. female who presents to [place] today for foot pain.  Patient developed mild to moderate foot pain earlier this week.  She had some pain like a pulling sensation in the bottom of her foot walking on Tuesday the ninth.  She was able to continue walking as the pain subsided.  She was doing well until yesterday when she felt more pain worsened into the evening.  She developed pain and swelling to the mid plantar foot bad enough that she is having trouble walking.  She is tried ice and Aleve.  This helps a little.  She notes the pain is worse with activity and better with rest.  She has had pain off and on in her foot for some time now but this is worse than typical.  She denies any injury.  She is concerned she may have developed a stress fracture or plantar fasciitis.    ROS:  As above  Exam:  BP 138/88   Pulse 76   Ht 5\' 3"  (1.6 m)   Wt 169 lb (76.7 kg)   SpO2 99%   BMI 29.94 kg/m  Wt Readings from Last 5 Encounters:  09/03/18 169 lb (76.7 kg)  12/07/17 162 lb (73.5 kg)  05/20/17 165 lb (74.8 kg)  03/20/17 169 lb (76.7 kg)  12/02/16 163 lb (73.9 kg)   General: Well Developed, well nourished, and in no acute distress.  Neuro/Psych: Alert and oriented x3, extra-ocular muscles intact, able to move all 4 extremities, sensation grossly intact. Skin: Warm and dry, no rashes noted.  Respiratory: Not using accessory muscles, speaking in full sentences, trachea midline.  Cardiovascular: Pulses palpable, no extremity edema. Abdomen: Does not appear distended. MSK: Right foot largely normal-appearing.  Mildly tender palpation mid plantar foot.  Normal foot and ankle motion.  Pulses cap refill and sensation are intact distally.    Lab and Radiology Results No results found for this or any previous visit (from the past 72 hour(s)). No results found.  Limited musculoskeletal ultrasound right plantar foot reveals a  relatively normal-appearing plantar fashion at its origin at the calcaneus measuring 38 mm in width.  There is associated small calcaneal spur.  The plantar fascia looks normal towards the mid to forefoot.  However deep to the plantar fascia and deep to the flexor tendons is a cystic structure measuring about 1 x 0.8 cm.  No blood flow within cyst.  Normal bony structures otherwise on ultrasound examination.  X-ray images right foot personally independently reviewed.  No obvious fractures or deformity. Await formal radiology review  Assessment and Plan: 48 y.o. female with right foot pain.  Patient has an exacerbation of her mild chronic intermittent pain.  However on ultrasound she has a cystic structure at the plantar foot that could be a ganglion cyst or seroma or some other issue.  X-ray was unrevealing per my read however radiology review is still pending.  Plan for cam walker boot diclofenac gel and relative rest.  In addition recommend arch straps.  Recheck in about 1 to 2 weeks.  If not improving at that point may consider MRI to further evaluate cystic structure.  I am hopeful will resolve on its own and we can go from there.   PDMP not reviewed this encounter. Orders Placed This Encounter  Procedures  . DG Foot Complete Right    Standing Status:   Future  Number of Occurrences:   1    Standing Expiration Date:   11/03/2019    Order Specific Question:   Reason for Exam (SYMPTOM  OR DIAGNOSIS REQUIRED)    Answer:   eval pain midfoot. Large cyst mid plantar foot on [place]    Order Specific Question:   Is patient pregnant?    Answer:   No    Order Specific Question:   Preferred imaging location?    Answer:   [facility]    Order Specific Question:   Radiology Contrast Protocol - do NOT remove file path    Answer:   [email or web address]   Meds ordered this encounter  Medications  . amoxicillin (AMOXIL) 500 MG capsule    Sig: 1000mg  once  by mouth prior to teeth cleaning.    Dispense:  4 capsule    Refill:  3  . diclofenac sodium (VOLTAREN) 1 % GEL    Sig: Apply 2 g topically 4 (four) times daily. To affected joint.    Dispense:  100 g    Refill:  11    Historical information moved to improve visibility of documentation.  Past Medical History:  Diagnosis Date  . Migraine   . Prediabetes    Past Surgical History:  Procedure Laterality Date  . bladder tack    . BONE EXOSTOSIS EXCISION     removal from left femur(benign)  . PARTIAL HYSTERECTOMY    . removal of bone spurs     form 4th and 5th toes  . TERATOMA EXCISION     bilateral  . TOTAL HIP ARTHROPLASTY     right hip  . UMBILICAL HERNIA REPAIR     Social History   Tobacco Use  . Smoking status: Never Smoker  . Smokeless tobacco: Never Used  Substance Use Topics  . Alcohol use: Yes    Comment: socially   family history includes Diabetes in her paternal grandfather; Heart attack in her maternal grandfather; Hyperlipidemia in her paternal grandfather; Hypertension in her paternal grandfather; Lung cancer in her mother; Neurologic Disorder in her father.  Medications: Current Outpatient Medications  Medication Sig Dispense Refill  . amoxicillin (AMOXIL) 500 MG capsule 1000mg  once by mouth prior to teeth cleaning. 4 capsule 3  . diclofenac sodium (VOLTAREN) 1 % GEL Apply 2 g topically 4 (four) times daily. To affected joint. 100 g 11   No current facility-administered medications for this visit.    Allergies  Allergen Reactions  . Ivp Dye [Iodinated Diagnostic Agents] Hives  . Dolobid [Diflunisal]   . Percocet [Oxycodone-Acetaminophen] Hives  . Codeine Anxiety  . Codeine Sulfate Anxiety      Discussed warning signs or symptoms. Please see discharge instructions. Patient expresses understanding.

## 2018-09-17 ENCOUNTER — Ambulatory Visit: Payer: No Typology Code available for payment source | Admitting: Family Medicine

## 2018-09-20 ENCOUNTER — Encounter: Payer: Self-pay | Admitting: Family Medicine

## 2018-09-20 ENCOUNTER — Ambulatory Visit: Payer: No Typology Code available for payment source | Admitting: Family Medicine

## 2018-09-20 VITALS — BP 137/85 | HR 83 | Temp 98.1°F | Wt 169.0 lb

## 2018-09-20 DIAGNOSIS — B351 Tinea unguium: Secondary | ICD-10-CM

## 2018-09-20 DIAGNOSIS — M67479 Ganglion, unspecified ankle and foot: Secondary | ICD-10-CM

## 2018-09-20 DIAGNOSIS — M79671 Pain in right foot: Secondary | ICD-10-CM | POA: Diagnosis not present

## 2018-09-20 NOTE — Progress Notes (Signed)
Wendy Anderson is a 48 y.o. female who presents to [place] today for follow-up right foot pain.  Patient was seen on June 12 for right foot pain.  Ultrasound examination showed a cystic structure at the mid plantar foot deep to the plantar fascia and flexor tendons.  Etiology is unclear.  Thought to be ganglion cyst or seroma.  Plan at the time was for cam walker boot relative rest and diclofenac gel.  Additionally recommended arch straps.  Plan to recheck in 2 weeks.  If not improving that time would consider MRI.  In the interim patient notes that her foot pain is improved.  She still has some pain but overall is better.  She is able to use arch straps and [place] shoes which do help.  Patient also notes ongoing nail fungus on her big toes bilaterally.  She is trimming it back but notes some Brecheisen discoloration at times.  She is not tried much treatment topically yet.  She like to avoid systemic medications if possible.     ROS:  As above  Exam:  BP 137/85   Pulse 83   Temp 98.1 F (36.7 C) (Oral)   Wt 169 lb (76.7 kg)   BMI 29.94 kg/m  Wt Readings from Last 5 Encounters:  09/20/18 169 lb (76.7 kg)  09/03/18 169 lb (76.7 kg)  12/07/17 162 lb (73.5 kg)  05/20/17 165 lb (74.8 kg)  03/20/17 169 lb (76.7 kg)   General: Well Developed, well nourished, and in no acute distress.  Neuro/Psych: Alert and oriented x3, extra-ocular muscles intact, able to move all 4 extremities, sensation grossly intact. Skin: Warm and dry, no rashes noted.  Respiratory: Not using accessory muscles, speaking in full sentences, trachea midline.  Cardiovascular: Pulses palpable, no extremity edema. Abdomen: Does not appear distended. MSK:  Right foot: Relatively normal-appearing with no significant deformity.  Mildly tender to palpation plantar midfoot. Normal foot motion.  Pulses cap refill sensation intact distally.  Right great toe slight discoloration  distally. Left great toe slight whitish discoloration distally.    Lab and Radiology Results Limited musculoskeletal ultrasound right plantar foot reveals a cystic structure mid arch deep to plantar fascia measuring 0.4 x 0.8 x 1.8 cm consistent appearance with ganglion cyst.  Vascular activity associated with this structure.  Normal bony structures otherwise.    Assessment and Plan: 48 y.o. female with  Cystic structure right plantar foot likely ganglion cyst.  Discussed options.  Patient is clinically doing pretty well right now so she elects for watchful waiting approach.  However if needed neck step would be either MRI or ultrasound-guided aspiration injection of cystic structure likely ganglion cyst.  Onychomycosis toenails bilaterally.  Discussed options.  Patient would like to avoid systemic medications.  Will use topical antifungal treatments over-the-counter.  Recheck as needed.   PDMP not reviewed this encounter. No orders of the defined types were placed in this encounter.  No orders of the defined types were placed in this encounter.   Historical information moved to improve visibility of documentation.  Past Medical History:  Diagnosis Date  . Migraine   . Prediabetes    Past Surgical History:  Procedure Laterality Date  . bladder tack    . BONE EXOSTOSIS EXCISION     removal from left femur(benign)  . PARTIAL HYSTERECTOMY    . removal of bone spurs     form 4th and 5th toes  . TERATOMA EXCISION  bilateral  . TOTAL HIP ARTHROPLASTY     right hip  . UMBILICAL HERNIA REPAIR     Social History   Tobacco Use  . Smoking status: Never Smoker  . Smokeless tobacco: Never Used  Substance Use Topics  . Alcohol use: Yes    Comment: socially   family history includes Diabetes in her paternal grandfather; Heart attack in her maternal grandfather; Hyperlipidemia in her paternal grandfather; Hypertension in her paternal grandfather; Lung cancer in her mother;  Neurologic Disorder in her father.  Medications: Current Outpatient Medications  Medication Sig Dispense Refill  . diclofenac sodium (VOLTAREN) 1 % GEL Apply 2 g topically 4 (four) times daily. To affected joint. 100 g 11   No current facility-administered medications for this visit.    Allergies  Allergen Reactions  . Ivp Dye [Iodinated Diagnostic Agents] Hives  . Dolobid [Diflunisal]   . Percocet [Oxycodone-Acetaminophen] Hives  . Codeine Anxiety  . Codeine Sulfate Anxiety      Discussed warning signs or symptoms. Please see discharge instructions. Patient expresses understanding.

## 2018-09-20 NOTE — Patient Instructions (Signed)
Thank you for coming in today. Keep current treatment for the foot.  Next step if needed are MRI and or Injection.   For the toenail use over the counter toenail fungus treatment because your nails are barely affected.  If needed next steps are systemic oral medicine.   Onychomycosis/Fungal Toenails  WHAT IS IT? An infection that lies within the keratin of your nail plate that is caused by a fungus.  WHY ME? Fungal infections affect all ages, sexes, races, and creeds.  There may be many factors that predispose you to a fungal infection such as age, coexisting medical conditions such as diabetes, or an autoimmune disease; stress, medications, fatigue, genetics, etc.  Bottom line: fungus thrives in a warm, moist environment and your shoes offer such a location.  IS IT CONTAGIOUS? Theoretically, yes.  You do not want to share shoes, nail clippers or files with someone who has fungal toenails.  Walking around barefoot in the same room or sleeping in the same bed is unlikely to transfer the organism.  It is important to realize, however, that fungus can spread easily from one nail to the next on the same foot.  HOW DO WE TREAT THIS?  There are several ways to treat this condition.  Treatment may depend on many factors such as age, medications, pregnancy, liver and kidney conditions, etc.  It is best to ask your doctor which options are available to you.  1. No treatment.   Unlike many other medical concerns, you can live with this condition.  However for many people this can be a painful condition and may lead to ingrown toenails or a bacterial infection.  It is recommended that you keep the nails cut short to help reduce the amount of fungal nail. 2. Topical treatment.  These range from herbal remedies to prescription strength nail lacquers.  About 40-50% effective, topicals require twice daily application for approximately 9 to 12 months or until an entirely new nail has grown out.  The most effective  topicals are medical grade medications available through physicians offices. 3. Oral antifungal medications.  With an 80-90% cure rate, the most common oral medication requires 3 to 4 months of therapy and stays in your system for a year as the new nail grows out.  Oral antifungal medications do require blood work to make sure it is a safe drug for you.  A liver function panel will be performed prior to starting the medication and after the first month of treatment.  It is important to have the blood work performed to avoid any harmful side effects.  In general, this medication safe but blood work is required. 4. Laser Therapy.  This treatment is performed by applying a specialized laser to the affected nail plate.  This therapy is noninvasive, fast, and non-painful.  It is not covered by insurance and is therefore, out of pocket.  The results have been very good with a 80-95% cure rate.  [facility] is the only practice in the area to offer this therapy. 5. Permanent Nail Avulsion.  Removing the entire nail so that a new nail will not grow back.   Ganglion Cyst  A ganglion cyst is a non-cancerous, fluid-filled lump that occurs near a joint or tendon. The cyst grows out of a joint or the lining of a tendon. Ganglion cysts most often develop in the hand or wrist, but they can also develop in the shoulder, elbow, hip, knee, ankle, or foot. Ganglion cysts  are ball-shaped or egg-shaped. Their size can range from the size of a pea to larger than a grape. Increased activity may cause the cyst to get bigger because more fluid starts to build up. What are the causes? The exact cause of this condition is not known, but it may be related to:  Inflammation or irritation around the joint.  An injury.  Repetitive movements or overuse.  Arthritis. What increases the risk? You are more likely to develop this condition if:  You are a woman.  You are 77-88 years old. What are the signs or  symptoms? The main symptom of this condition is a lump. It most often appears on the hand or wrist. In many cases, there are no other symptoms, but a cyst can sometimes cause:  Tingling.  Pain.  Numbness.  Muscle weakness.  Weak grip.  Less range of motion in a joint. How is this diagnosed? Ganglion cysts are usually diagnosed based on a physical exam. Your health care provider will feel the lump and may shine a light next to it. If it is a ganglion cyst, the light will likely shine through it. Your health care provider may order an X-ray, ultrasound, or MRI to rule out other conditions. How is this treated? Ganglion cysts often go away on their own without treatment. If you have pain or other symptoms, treatment may be needed. Treatment is also needed if the ganglion cyst limits your movement or if it gets infected. Treatment may include:  Wearing a brace or splint on your wrist or finger.  Taking anti-inflammatory medicine.  Having fluid drained from the lump with a needle (aspiration).  Getting a steroid injected into the joint.  Having surgery to remove the ganglion cyst.  Placing a pad on your shoe or wearing shoes that will not rub against the cyst if it is on your foot. Follow these instructions at home:  Do not press on the ganglion cyst, poke it with a needle, or hit it.  Take over-the-counter and prescription medicines only as told by your health care provider.  If you have a brace or splint: ? Wear it as told by your health care provider. ? Remove it as told by your health care provider. Ask if you need to remove it when you take a shower or a bath.  Watch your ganglion cyst for any changes.  Keep all follow-up visits as told by your health care provider. This is important. Contact a health care provider if:  Your ganglion cyst becomes larger or more painful.  You have pus coming from the lump.  You have weakness or numbness in the affected area.  You  have a fever or chills. Get help right away if:  You have a fever and have any of these in the cyst area: ? Increased redness. ? Red streaks. ? Swelling. Summary  A ganglion cyst is a non-cancerous, fluid-filled lump that occurs near a joint or tendon.  Ganglion cysts most often develop in the hand or wrist, but they can also develop in the shoulder, elbow, hip, knee, ankle, or foot.  Ganglion cysts often go away on their own without treatment. This information is not intended to replace advice given to you by your health care provider. Make sure you discuss any questions you have with your health care provider. Document Released: 03/07/2000 Document Revised: 02/20/2017 Document Reviewed: 11/07/2016 Elsevier Patient Education  2020 [employer].

## 2018-09-30 ENCOUNTER — Other Ambulatory Visit: Payer: Self-pay | Admitting: Physician Assistant

## 2018-09-30 DIAGNOSIS — Z1239 Encounter for other screening for malignant neoplasm of breast: Secondary | ICD-10-CM

## 2018-11-04 ENCOUNTER — Other Ambulatory Visit: Payer: Self-pay

## 2018-11-04 ENCOUNTER — Ambulatory Visit (INDEPENDENT_AMBULATORY_CARE_PROVIDER_SITE_OTHER): Payer: No Typology Code available for payment source

## 2018-11-04 DIAGNOSIS — Z1239 Encounter for other screening for malignant neoplasm of breast: Secondary | ICD-10-CM

## 2018-11-08 NOTE — Progress Notes (Signed)
Normal mammogram. Follow up in 1 year.

## 2019-01-05 ENCOUNTER — Other Ambulatory Visit: Payer: Self-pay

## 2019-01-05 ENCOUNTER — Encounter: Payer: Self-pay | Admitting: Physician Assistant

## 2019-01-05 ENCOUNTER — Ambulatory Visit (INDEPENDENT_AMBULATORY_CARE_PROVIDER_SITE_OTHER): Payer: No Typology Code available for payment source | Admitting: Physician Assistant

## 2019-01-05 VITALS — BP 132/90 | HR 80 | Ht 63.0 in | Wt 169.0 lb

## 2019-01-05 DIAGNOSIS — R7301 Impaired fasting glucose: Secondary | ICD-10-CM

## 2019-01-05 DIAGNOSIS — E663 Overweight: Secondary | ICD-10-CM

## 2019-01-05 DIAGNOSIS — Z13220 Encounter for screening for lipoid disorders: Secondary | ICD-10-CM

## 2019-01-05 DIAGNOSIS — G479 Sleep disorder, unspecified: Secondary | ICD-10-CM

## 2019-01-05 DIAGNOSIS — Z0000 Encounter for general adult medical examination without abnormal findings: Secondary | ICD-10-CM | POA: Diagnosis not present

## 2019-01-05 DIAGNOSIS — Z131 Encounter for screening for diabetes mellitus: Secondary | ICD-10-CM

## 2019-01-05 MED ORDER — PHENTERMINE HCL 37.5 MG PO TABS: 37.5000 mg | ORAL_TABLET | Freq: Every day | ORAL | 0 refills | Status: DC

## 2019-01-05 NOTE — Patient Instructions (Addendum)
Health Maintenance, Female Adopting a healthy lifestyle and getting preventive care are important in promoting health and wellness. Ask your health care provider about:  The right schedule for you to have regular tests and exams.  Things you can do on your own to prevent diseases and keep yourself healthy. What should I know about diet, weight, and exercise? Eat a healthy diet   Eat a diet that includes plenty of vegetables, fruits, low-fat dairy products, and lean protein.  Do not eat a lot of foods that are high in solid fats, added sugars, or sodium. Maintain a healthy weight Body mass index (BMI) is used to identify weight problems. It estimates body fat based on height and weight. Your health care provider can help determine your BMI and help you achieve or maintain a healthy weight. Get regular exercise Get regular exercise. This is one of the most important things you can do for your health. Most adults should:  Exercise for at least 150 minutes each week. The exercise should increase your heart rate and make you sweat (moderate-intensity exercise).  Do strengthening exercises at least twice a week. This is in addition to the moderate-intensity exercise.  Spend less time sitting. Even light physical activity can be beneficial. Watch cholesterol and blood lipids Have your blood tested for lipids and cholesterol at 50 years of age, then have this test every 5 years. Have your cholesterol levels checked more often if:  Your lipid or cholesterol levels are high.  You are older than 50 years of age.  You are at high risk for heart disease. What should I know about cancer screening? Depending on your health history and family history, you may need to have cancer screening at various ages. This may include screening for:  Breast cancer.  Cervical cancer.  Colorectal cancer.  Skin cancer.  Lung cancer. What should I know about heart disease, diabetes, and high blood  pressure? Blood pressure and heart disease  High blood pressure causes heart disease and increases the risk of stroke. This is more likely to develop in people who have high blood pressure readings, are of African descent, or are overweight.  Have your blood pressure checked: ? Every 3-5 years if you are 18-39 years of age. ? Every year if you are 40 years old or older. Diabetes Have regular diabetes screenings. This checks your fasting blood sugar level. Have the screening done:  Once every three years after age 40 if you are at a normal weight and have a low risk for diabetes.  More often and at a younger age if you are overweight or have a high risk for diabetes. What should I know about preventing infection? Hepatitis B If you have a higher risk for hepatitis B, you should be screened for this virus. Talk with your health care provider to find out if you are at risk for hepatitis B infection. Hepatitis C Testing is recommended for:  Everyone born from 1945 through 1965.  Anyone with known risk factors for hepatitis C. Sexually transmitted infections (STIs)  Get screened for STIs, including gonorrhea and chlamydia, if: ? You are sexually active and are younger than 50 years of age. ? You are older than 50 years of age and your health care provider tells you that you are at risk for this type of infection. ? Your sexual activity has changed since you were last screened, and you are at increased risk for chlamydia or gonorrhea. Ask your health care provider if   you are at risk.  Ask your health care provider about whether you are at high risk for HIV. Your health care provider may recommend a prescription medicine to help prevent HIV infection. If you choose to take medicine to prevent HIV, you should first get tested for HIV. You should then be tested every 3 months for as long as you are taking the medicine. Pregnancy  If you are about to stop having your period (premenopausal) and  you may become pregnant, seek counseling before you get pregnant.  Take 400 to 800 micrograms (mcg) of folic acid every day if you become pregnant.  Ask for birth control (contraception) if you want to prevent pregnancy. Osteoporosis and menopause Osteoporosis is a disease in which the bones lose minerals and strength with aging. This can result in bone fractures. If you are 45 years old or older, or if you are at risk for osteoporosis and fractures, ask your health care provider if you should:  Be screened for bone loss.  Take a calcium or vitamin D supplement to lower your risk of fractures.  Be given hormone replacement therapy (HRT) to treat symptoms of menopause. Follow these instructions at home: Lifestyle  Do not use any products that contain nicotine or tobacco, such as cigarettes, e-cigarettes, and chewing tobacco. If you need help quitting, ask your health care provider.  Do not use street drugs.  Do not share needles.  Ask your health care provider for help if you need support or information about quitting drugs. Alcohol use  Do not drink alcohol if: ? Your health care provider tells you not to drink. ? You are pregnant, may be pregnant, or are planning to become pregnant.  If you drink alcohol: ? Limit how much you use to 0-1 drink a day. ? Limit intake if you are breastfeeding.  Be aware of how much alcohol is in your drink. In the [place] one drink equals one 12 oz bottle of beer (355 mL), one 5 oz glass of wine (148 mL), or one 1 oz glass of hard liquor (44 mL). General instructions  Schedule regular health, dental, and eye exams.  Stay current with your vaccines.  Tell your health care provider if: ? You often feel depressed. ? You have ever been abused or do not feel safe at home. Summary  Adopting a healthy lifestyle and getting preventive care are important in promoting health and wellness.  Follow your health care provider's instructions about healthy  diet, exercising, and getting tested or screened for diseases.  Follow your health care provider's instructions on monitoring your cholesterol and blood pressure. This information is not intended to replace advice given to you by your health care provider. Make sure you discuss any questions you have with your health care provider. Document Released: 09/23/2010 Document Revised: 03/03/2018 Document Reviewed: 03/03/2018 Elsevier Patient Education  2020 [employer].   Menopause Menopause is the normal time of life when menstrual periods stop completely. It is usually confirmed by 12 months without a menstrual period. The transition to menopause (perimenopause) most often happens between the ages of 12 and 68. During perimenopause, hormone levels change in your body, which can cause symptoms and affect your health. Menopause may increase your risk for:  Loss of bone (osteoporosis), which causes bone breaks (fractures).  Depression.  Hardening and narrowing of the arteries (atherosclerosis), which can cause heart attacks and strokes. What are the causes? This condition is usually caused by a natural change in hormone levels  that happens as you get older. The condition may also be caused by surgery to remove both ovaries (bilateral oophorectomy). What increases the risk? This condition is more likely to start at an earlier age if you have certain medical conditions or treatments, including:  A tumor of the pituitary gland in the brain.  A disease that affects the ovaries and hormone production.  Radiation treatment for cancer.  Certain cancer treatments, such as chemotherapy or hormone (anti-estrogen) therapy.  Heavy smoking and excessive alcohol use.  Family history of early menopause. This condition is also more likely to develop earlier in women who are very thin. What are the signs or symptoms? Symptoms of this condition include:  Hot flashes.  Irregular menstrual periods.   Night sweats.  Changes in feelings about sex. This could be a decrease in sex drive or an increased comfort around your sexuality.  Vaginal dryness and thinning of the vaginal walls. This may cause painful intercourse.  Dryness of the skin and development of wrinkles.  Headaches.  Problems sleeping (insomnia).  Mood swings or irritability.  Memory problems.  Weight gain.  Hair growth on the face and chest.  Bladder infections or problems with urinating. How is this diagnosed? This condition is diagnosed based on your medical history, a physical exam, your age, your menstrual history, and your symptoms. Hormone tests may also be done. How is this treated? In some cases, no treatment is needed. You and your health care provider should make a decision together about whether treatment is necessary. Treatment will be based on your individual condition and preferences. Treatment for this condition focuses on managing symptoms. Treatment may include:  Menopausal hormone therapy (MHT).  Medicines to treat specific symptoms or complications.  Acupuncture.  Vitamin or herbal supplements. Before starting treatment, make sure to let your health care provider know if you have a personal or family history of:  Heart disease.  Breast cancer.  Blood clots.  Diabetes.  Osteoporosis. Follow these instructions at home: Lifestyle  Do not use any products that contain nicotine or tobacco, such as cigarettes and e-cigarettes. If you need help quitting, ask your health care provider.  Get at least 30 minutes of physical activity on 5 or more days each week.  Avoid alcoholic and caffeinated beverages, as well as spicy foods. This may help prevent hot flashes.  Get 7-8 hours of sleep each night.  If you have hot flashes, try: ? Dressing in layers. ? Avoiding things that may trigger hot flashes, such as spicy food, warm places, or stress. ? Taking slow, deep breaths when a hot flash  starts. ? Keeping a fan in your home and office.  Find ways to manage stress, such as deep breathing, meditation, or journaling.  Consider going to group therapy with other women who are having menopause symptoms. Ask your health care provider about recommended group therapy meetings. Eating and drinking  Eat a healthy, balanced diet that contains whole grains, lean protein, low-fat dairy, and plenty of fruits and vegetables.  Your health care provider may recommend adding more soy to your diet. Foods that contain soy include tofu, tempeh, and soy milk.  Eat plenty of foods that contain calcium and vitamin D for bone health. Items that are rich in calcium include low-fat milk, yogurt, beans, almonds, sardines, broccoli, and kale. Medicines  Take over-the-counter and prescription medicines only as told by your health care provider.  Talk with your health care provider before starting any herbal supplements. If prescribed,  take vitamins and supplements as told by your health care provider. These may include: ? Calcium. Women age 69 and older should get 1,200 mg (milligrams) of calcium every day. ? Vitamin D. Women need 600-800 International Units of vitamin D each day. ? Vitamins B12 and B6. Aim for 50 micrograms of B12 and 1.5 mg of B6 each day. General instructions  Keep track of your menstrual periods, including: ? When they occur. ? How heavy they are and how long they last. ? How much time passes between periods.  Keep track of your symptoms, noting when they start, how often you have them, and how long they last.  Use vaginal lubricants or moisturizers to help with vaginal dryness and improve comfort during sex.  Keep all follow-up visits as told by your health care provider. This is important. This includes any group therapy or counseling. Contact a health care provider if:  You are still having menstrual periods after age 61.  You have pain during sex.  You have not had a  period for 12 months and you develop vaginal bleeding. Get help right away if:  You have: ? Severe depression. ? Excessive vaginal bleeding. ? Pain when you urinate. ? A fast or irregular heart beat (palpitations). ? Severe headaches. ? Abdomen (abdominal) pain or severe indigestion.  You fell and you think you have a broken bone.  You develop leg or chest pain.  You develop vision problems.  You feel a lump in your breast. Summary  Menopause is the normal time of life when menstrual periods stop completely. It is usually confirmed by 12 months without a menstrual period.  The transition to menopause (perimenopause) most often happens between the ages of 30 and 58.  Symptoms can be managed through medicines, lifestyle changes, and complementary therapies such as acupuncture.  Eat a balanced diet that is rich in nutrients to promote bone health and heart health and to manage symptoms during menopause. This information is not intended to replace advice given to you by your health care provider. Make sure you discuss any questions you have with your health care provider. Document Released: 05/31/2003 Document Revised: 02/20/2017 Document Reviewed: 04/12/2016 Elsevier Patient Education  2020 [place].  Insomnia Insomnia is a sleep disorder that makes it difficult to fall asleep or stay asleep. Insomnia can cause fatigue, low energy, difficulty concentrating, mood swings, and poor performance at work or school. There are three different ways to classify insomnia:  Difficulty falling asleep.  Difficulty staying asleep.  Waking up too early in the morning. Any type of insomnia can be long-term (chronic) or short-term (acute). Both are common. Short-term insomnia usually lasts for three months or less. Chronic insomnia occurs at least three times a week for longer than three months. What are the causes? Insomnia may be caused by another condition, situation, or substance, such  as:  Anxiety.  Certain medicines.  Gastroesophageal reflux disease (GERD) or other gastrointestinal conditions.  Asthma or other breathing conditions.  Restless legs syndrome, sleep apnea, or other sleep disorders.  Chronic pain.  Menopause.  Stroke.  Abuse of alcohol, tobacco, or illegal drugs.  Mental health conditions, such as depression.  Caffeine.  Neurological disorders, such as Alzheimer's disease.  An overactive thyroid (hyperthyroidism). Sometimes, the cause of insomnia may not be known. What increases the risk? Risk factors for insomnia include:  Gender. Women are affected more often than men.  Age. Insomnia is more common as you get older.  Stress.  Lack  of exercise.  Irregular work schedule or working night shifts.  Traveling between different time zones.  Certain medical and mental health conditions. What are the signs or symptoms? If you have insomnia, the main symptom is having trouble falling asleep or having trouble staying asleep. This may lead to other symptoms, such as:  Feeling fatigued or having low energy.  Feeling nervous about going to sleep.  Not feeling rested in the morning.  Having trouble concentrating.  Feeling irritable, anxious, or depressed. How is this diagnosed? This condition may be diagnosed based on:  Your symptoms and medical history. Your health care provider may ask about: ? Your sleep habits. ? Any medical conditions you have. ? Your mental health.  A physical exam. How is this treated? Treatment for insomnia depends on the cause. Treatment may focus on treating an underlying condition that is causing insomnia. Treatment may also include:  Medicines to help you sleep.  Counseling or therapy.  Lifestyle adjustments to help you sleep better. Follow these instructions at home: Eating and drinking   Limit or avoid alcohol, caffeinated beverages, and cigarettes, especially close to bedtime. These can  disrupt your sleep.  Do not eat a large meal or eat spicy foods right before bedtime. This can lead to digestive discomfort that can make it hard for you to sleep. Sleep habits   Keep a sleep diary to help you and your health care provider figure out what could be causing your insomnia. Write down: ? When you sleep. ? When you wake up during the night. ? How well you sleep. ? How rested you feel the next day. ? Any side effects of medicines you are taking. ? What you eat and drink.  Make your bedroom a dark, comfortable place where it is easy to fall asleep. ? Put up shades or blackout curtains to block light from outside. ? Use a [place] noise machine to block noise. ? Keep the temperature cool.  Limit screen use before bedtime. This includes: ? Watching TV. ? Using your smartphone, tablet, or computer.  Stick to a routine that includes going to bed and waking up at the same times every day and night. This can help you fall asleep faster. Consider making a quiet activity, such as reading, part of your nighttime routine.  Try to avoid taking naps during the day so that you sleep better at night.  Get out of bed if you are still awake after 15 minutes of trying to sleep. Keep the lights down, but try reading or doing a quiet activity. When you feel sleepy, go back to bed. General instructions  Take over-the-counter and prescription medicines only as told by your health care provider.  Exercise regularly, as told by your health care provider. Avoid exercise starting several hours before bedtime.  Use relaxation techniques to manage stress. Ask your health care provider to suggest some techniques that may work well for you. These may include: ? Breathing exercises. ? Routines to release muscle tension. ? Visualizing peaceful scenes.  Make sure that you drive carefully. Avoid driving if you feel very sleepy.  Keep all follow-up visits as told by your health care provider. This is  important. Contact a health care provider if:  You are tired throughout the day.  You have trouble in your daily routine due to sleepiness.  You continue to have sleep problems, or your sleep problems get worse. Get help right away if:  You have serious thoughts about hurting yourself or  someone else. If you ever feel like you may hurt yourself or others, or have thoughts about taking your own life, get help right away. You can go to your nearest emergency department or call:  Your local emergency services (911 in the [place]  A suicide crisis helpline, such as the [place] at 615-402-0909. This is open 24 hours a day. Summary  Insomnia is a sleep disorder that makes it difficult to fall asleep or stay asleep.  Insomnia can be long-term (chronic) or short-term (acute).  Treatment for insomnia depends on the cause. Treatment may focus on treating an underlying condition that is causing insomnia.  Keep a sleep diary to help you and your health care provider figure out what could be causing your insomnia. This information is not intended to replace advice given to you by your health care provider. Make sure you discuss any questions you have with your health care provider. Document Released: 03/07/2000 Document Revised: 02/20/2017 Document Reviewed: 12/18/2016 Elsevier Patient Education  2020 [place].  Menopause and Hormone Replacement Therapy Menopause is a normal time of life when menstrual periods stop completely and the ovaries stop producing the female hormones estrogen and progesterone. This lack of hormones can affect your health and cause undesirable symptoms. Hormone replacement therapy (HRT) can relieve some of those symptoms. What is hormone replacement therapy? HRT is the use of artificial (synthetic) hormones to replace hormones that your body has stopped producing because you have reached menopause. What are my options for HRT?  HRT may  consist of the synthetic hormones estrogen and progestin, or it may consist of only estrogen (estrogen-only therapy). You and your health care provider will decide which form of HRT is best for you. If you choose to be on HRT and you have a uterus, estrogen and progestin are usually prescribed. Estrogen-only therapy is used for women who do not have a uterus. Possible options for taking HRT include:  Pills.  Patches.  Gels.  Sprays.  Vaginal cream.  Vaginal rings.  Vaginal inserts. The amount of hormone(s) that you take and how long you take the hormone(s) varies according to your health. It is important to:  Begin HRT with the lowest possible dosage.  Stop HRT as soon as your health care provider tells you to stop.  Work with your health care provider so that you feel informed and comfortable with your decisions. What are the benefits of HRT? HRT can reduce the frequency and severity of menopausal symptoms. Benefits of HRT vary according to the kind of symptoms that you have, how severe they are, and your overall health. HRT may help to improve the following symptoms of menopause:  Hot flashes and night sweats. These are sudden feelings of heat that spread over the face and body. The skin may turn red, like a blush. Night sweats are hot flashes that happen while you are sleeping or trying to sleep.  Bone loss (osteoporosis). The body loses calcium more quickly after menopause, causing the bones to become weaker. This can increase the risk for bone breaks (fractures).  Vaginal dryness. The lining of the vagina can become thin and dry, which can cause pain during sex or cause infection, burning, or itching.  Urinary tract infections.  Urinary incontinence. This is the inability to control when you pass urine.  Irritability.  Short-term memory problems. What are the risks of HRT? Risks of HRT vary depending on your individual health and medical history. Risks of HRT also depend  on whether you receive both estrogen and progestin or you receive estrogen only. HRT may increase the risk of:  Spotting. This is when a small amount of blood leaks from the vagina unexpectedly.  Endometrial cancer. This cancer is in the lining of the uterus (endometrium).  Breast cancer.  Increased density of breast tissue. This can make it harder to find breast cancer on a breast X-ray (mammogram).  Stroke.  Heart disease.  Blood clots.  Gallbladder disease.  Liver disease. Risks of HRT can increase if you have any of the following conditions:  Endometrial cancer.  Liver disease.  Heart disease.  Breast cancer.  History of blood clots.  History of stroke. Follow these instructions at home:  Take over-the-counter and prescription medicines only as told by your health care provider.  Get mammograms, pelvic exams, and medical checkups as often as told by your health care provider.  Have Pap tests done as often as told by your health care provider. A Pap test is sometimes called a Pap smear. It is a screening test that is used to check for signs of cancer of the cervix and vagina. A Pap test can also identify the presence of infection or precancerous changes. Pap tests may be done: ? Every 3 years, starting at age 64. ? Every 5 years, starting after age 61, in combination with testing for human papillomavirus (HPV). ? More often or less often depending on other medical conditions you have, your age, and other risk factors.  It is up to you to get the results of your Pap test. Ask your health care provider, or the department that is doing the test, when your results will be ready.  Keep all follow-up visits as told by your health care provider. This is important. Contact a health care provider if you have:  Pain or swelling in your legs.  Shortness of breath.  Chest pain.  Lumps or changes in your breasts or armpits.  Slurred speech.  Pain, burning, or bleeding  when you urinate.  Unusual vaginal bleeding.  Dizziness or headaches.  Weakness or numbness in any part of your arms or legs.  Pain in your abdomen. Summary  Menopause is a normal time of life when menstrual periods stop completely and the ovaries stop producing the female hormones estrogen and progesterone.  Hormone replacement therapy (HRT) can relieve some of the symptoms of menopause.  HRT can reduce the frequency and severity of menopausal symptoms.  Risks of HRT vary depending on your individual health and medical history. This information is not intended to replace advice given to you by your health care provider. Make sure you discuss any questions you have with your health care provider. Document Released: 12/07/2002 Document Revised: 11/10/2017 Document Reviewed: 11/10/2017 Elsevier Patient Education  2020 [employer].

## 2019-01-05 NOTE — Progress Notes (Signed)
Subjective:     Wendy Anderson is a 48 y.o. female and is here for a comprehensive physical exam. The patient reports problems - continues to battle weight and eating so that she feel good. she knows she can't have gluten but she still eats it. .   Social History   Socioeconomic History  . Marital status: Married    Spouse name: Not on file  . Number of children: Not on file  . Years of education: Not on file  . Highest education level: Not on file  Occupational History  . Not on file  Social Needs  . Financial resource strain: Not on file  . Food insecurity    Worry: Not on file    Inability: Not on file  . Transportation needs    Medical: Not on file    Non-medical: Not on file  Tobacco Use  . Smoking status: Never Smoker  . Smokeless tobacco: Never Used  Substance and Sexual Activity  . Alcohol use: Yes    Comment: socially  . Drug use: No  . Sexual activity: Yes    Partners: Male  Lifestyle  . Physical activity    Days per week: Not on file    Minutes per session: Not on file  . Stress: Not on file  Relationships  . Social Herbalist on phone: Not on file    Gets together: Not on file    Attends religious service: Not on file    Active member of club or organization: Not on file    Attends meetings of clubs or organizations: Not on file    Relationship status: Not on file  . Intimate partner violence    Fear of current or ex partner: Not on file    Emotionally abused: Not on file    Physically abused: Not on file    Forced sexual activity: Not on file  Other Topics Concern  . Not on file  Social History Narrative  . Not on file   Health Maintenance  Topic Date Due  . HIV Screening  05/04/1983  . TETANUS/TDAP  03/25/2019  . MAMMOGRAM  11/04/2019  . PAP SMEAR-Modifier  01/13/2020  . COLONOSCOPY  03/13/2020  . INFLUENZA VACCINE  Discontinued    The following portions of the patient's history were reviewed and updated as appropriate:  allergies, current medications, past family history, past medical history, past social history, past surgical history and problem list.  Review of Systems Pertinent items noted in HPI and remainder of comprehensive ROS otherwise negative.   Objective:    BP 132/90   Pulse 80   Ht 5\' 3"  (1.6 m)   Wt 169 lb (76.7 kg)   SpO2 98%   BMI 29.94 kg/m  General appearance: alert, cooperative, appears stated age and overweight Head: Normocephalic, without obvious abnormality, atraumatic Eyes: conjunctivae/corneas clear. PERRL, EOM's intact. Fundi benign. Ears: normal TM's and external ear canals both ears Nose: Nares normal. Septum midline. Mucosa normal. No drainage or sinus tenderness. Throat: lips, mucosa, and tongue normal; teeth and gums normal Neck: no adenopathy, no carotid bruit, no JVD, supple, symmetrical, trachea midline and thyroid enlarged, non tender.  Back: symmetric, no curvature. ROM normal. No CVA tenderness. Lungs: clear to auscultation bilaterally Heart: regular rate and rhythm, S1, S2 normal, no murmur, click, rub or gallop Abdomen: soft, non-tender; bowel sounds normal; no masses,  no organomegaly Extremities: extremities normal, atraumatic, no cyanosis or edema Pulses: 2+ and symmetric Skin: Skin  color, texture, turgor normal. No rashes or lesions Lymph nodes: Cervical, supraclavicular, and axillary nodes normal. Neurologic: Alert and oriented X 3, normal strength and tone. Normal symmetric reflexes. Normal coordination and gait    Assessment:    Healthy female exam.      Plan:    Marland KitchenMarland KitchenDackeri was seen today for annual exam.  Diagnoses and all orders for this visit:  Routine physical examination -     Lipid Panel w/reflex Direct LDL -     COMPLETE METABOLIC PANEL WITH GFR -     TSH -     VITAMIN D 25 Hydroxy (Vit-D Deficiency, Fractures) -     CBC  Trouble in sleeping  Overweight (BMI 25.0-29.9) -     phentermine (ADIPEX-P) 37.5 MG tablet; Take 1 tablet  (37.5 mg total) by mouth daily before breakfast.  Screening for lipid disorders -     Lipid Panel w/reflex Direct LDL  Screening for diabetes mellitus -     COMPLETE METABOLIC PANEL WITH GFR  Elevated fasting glucose -     Hemoglobin A1C w/out eAG   Discussed 150 minutes of exercise a week.  Encouraged vitamin D 1000 units and Calcium 1300mg  or 4 servings of dairy a day.  Fasting labs ordered.  Mammogram/colonoscopy UTD.  Vaccines UTD.  Discussed shingles vaccine. For now will wait.   Marland Kitchen.Discussed low carb diet with 1500 calories and 80g of protein.  Exercising at least 150 minutes a week.  My Fitness Pal could be a Microbiologist.  Phentermine to jump start weight loss.  Side effects discussed.  Follow up in 1 month.    See After Visit Summary for Counseling Recommendations

## 2019-01-06 ENCOUNTER — Encounter: Payer: Self-pay | Admitting: Physician Assistant

## 2019-01-06 ENCOUNTER — Other Ambulatory Visit: Payer: Self-pay | Admitting: Physician Assistant

## 2019-01-06 MED ORDER — VITAMIN D (ERGOCALCIFEROL) 1.25 MG (50000 UNIT) PO CAPS: 50000.0000 [IU] | ORAL_CAPSULE | ORAL | 0 refills | Status: DC

## 2019-01-06 NOTE — Progress Notes (Signed)
Wendy Anderson,   Thyroid looks great. No anemia.  Fasting sugar is a little elevated. I will get my nurse to add A!C for further evaluation.  Vitamin D worsened. Do you want me to send some high dose weekly until we can get this up and then transition to oral again? You have not been taking any vitamin D correct?  Cholesterol has worsened. TG went up. I know you do not want to start a statin. So you need to really work on this with diet. If interested could consider OTC Fish oil 4000mg  and red yeast rice 1200mg  twice a day. To help.   Luvenia Starch

## 2019-01-08 LAB — LIPID PANEL W/REFLEX DIRECT LDL
Cholesterol: 250 mg/dL — ABNORMAL HIGH (ref <200)
HDL: 61 mg/dL (ref >=50)
LDL Cholesterol (Calc): 158 mg/dL (calc) — ABNORMAL HIGH
Non-HDL Cholesterol (Calc): 189 mg/dL (calc) — ABNORMAL HIGH (ref <130)
Total CHOL/HDL Ratio: 4.1 (calc) (ref <5.0)
Triglycerides: 172 mg/dL — ABNORMAL HIGH (ref <150)

## 2019-01-08 LAB — CBC
HCT: 40.3 % (ref 35.0–45.0)
Hemoglobin: 13.3 g/dL (ref 11.7–15.5)
MCH: 29.1 pg (ref 27.0–33.0)
MCHC: 33 g/dL (ref 32.0–36.0)
MCV: 88.2 fL (ref 80.0–100.0)
MPV: 10.2 fL (ref 7.5–12.5)
Platelets: 253 10*3/uL (ref 140–400)
RBC: 4.57 10*6/uL (ref 3.80–5.10)
RDW: 13.1 % (ref 11.0–15.0)
WBC: 5.6 10*3/uL (ref 3.8–10.8)

## 2019-01-08 LAB — COMPLETE METABOLIC PANEL WITH GFR
AG Ratio: 1.7 (calc) (ref 1.0–2.5)
ALT: 14 U/L (ref 6–29)
AST: 16 U/L (ref 10–35)
Albumin: 4.3 g/dL (ref 3.6–5.1)
Alkaline phosphatase (APISO): 50 U/L (ref 37–153)
BUN: 16 mg/dL (ref 7–25)
CO2: 27 mmol/L (ref 20–32)
Calcium: 9.4 mg/dL (ref 8.6–10.4)
Chloride: 105 mmol/L (ref 98–110)
Creat: 0.75 mg/dL (ref 0.50–1.05)
GFR, Est African American: 108 mL/min/{1.73_m2} (ref >=60)
GFR, Est Non African American: 93 mL/min/{1.73_m2} (ref >=60)
Globulin: 2.5 g/dL (calc) (ref 1.9–3.7)
Glucose, Bld: 103 mg/dL — ABNORMAL HIGH (ref 65–99)
Potassium: 4.6 mmol/L (ref 3.5–5.3)
Sodium: 140 mmol/L (ref 135–146)
Total Bilirubin: 0.3 mg/dL (ref 0.2–1.2)
Total Protein: 6.8 g/dL (ref 6.1–8.1)

## 2019-01-08 LAB — TSH: TSH: 1.48 mIU/L

## 2019-01-08 LAB — HEMOGLOBIN A1C W/OUT EAG: Hgb A1c MFr Bld: 5.9 % of total Hgb — ABNORMAL HIGH (ref <5.7)

## 2019-01-08 LAB — VITAMIN D 25 HYDROXY (VIT D DEFICIENCY, FRACTURES): Vit D, 25-Hydroxy: 15 ng/mL — ABNORMAL LOW (ref 30–100)

## 2019-01-09 NOTE — Progress Notes (Signed)
a1c is 5.9. up some from last year still in pre-diabetes range.

## 2019-04-19 ENCOUNTER — Encounter: Payer: Self-pay | Admitting: Physician Assistant

## 2019-04-20 ENCOUNTER — Encounter: Payer: Self-pay | Admitting: Physician Assistant

## 2019-07-07 ENCOUNTER — Other Ambulatory Visit: Payer: Self-pay

## 2019-07-07 ENCOUNTER — Encounter: Payer: Self-pay | Admitting: Nurse Practitioner

## 2019-07-07 ENCOUNTER — Ambulatory Visit: Payer: No Typology Code available for payment source | Admitting: Nurse Practitioner

## 2019-07-07 VITALS — BP 152/92 | HR 64 | Temp 97.6°F | Ht 63.0 in | Wt 170.8 lb

## 2019-07-07 DIAGNOSIS — S8992XA Unspecified injury of left lower leg, initial encounter: Secondary | ICD-10-CM

## 2019-07-07 NOTE — Progress Notes (Signed)
Acute Office Visit  Subjective:    Patient ID: Wendy Anderson, female    DOB: 06/27/1970, 49 y.o.   MRN: [id number]  Chief Complaint  Patient presents with  . Knee Pain    left knee, fell yesterday, change in gait, knee went behing her and twisted, felt a pop, pain in inner lateral knee, has been applying ice packs and taking naproxen with some benefit    HPI Wendy Anderson is a pleasant 49 year old female in today for left medial knee pain after a fall yesterday afternoon. She was walking through her kitchen when she slipped on a dog toy and fell in the kitchen floor. Her left leg went behind her and twisted with valgus stress as she fell and she felt a "pop". She reports immediate, significant pain, however, she was able to get up on her own and bear weight. The knee was not initially very painful while walking, but the pain increased through the night. She utilized ice and aleve, which she feels helped with the pain. She is able to bear weight and does not feel that her knee is locking or has significant laxity in the joint.   Past Medical History:  Diagnosis Date  . Migraine   . Prediabetes     Past Surgical History:  Procedure Laterality Date  . bladder tack    . BONE EXOSTOSIS EXCISION     removal from left femur(benign)  . PARTIAL HYSTERECTOMY    . removal of bone spurs     form 4th and 5th toes  . TERATOMA EXCISION     bilateral  . TOTAL HIP ARTHROPLASTY     right hip  . UMBILICAL HERNIA REPAIR      Family History  Problem Relation Age of Onset  . Lung cancer Mother        deceased at age 62  . Neurologic Disorder Father        Primary Progressive Aphasia  . Heart attack Maternal Grandfather   . Diabetes Paternal Grandfather   . Hyperlipidemia Paternal Grandfather   . Hypertension Paternal Grandfather   . Stroke Neg Hx     Social History   Socioeconomic History  . Marital status: Married    Spouse name: Not on file  . Number of children: Not on file  . Years of  education: Not on file  . Highest education level: Not on file  Occupational History  . Not on file  Tobacco Use  . Smoking status: Never Smoker  . Smokeless tobacco: Never Used  Substance and Sexual Activity  . Alcohol use: Yes    Comment: socially  . Drug use: No  . Sexual activity: Yes    Partners: Male  Other Topics Concern  . Not on file  Social History Narrative  . Not on file   Social Determinants of Health   Financial Resource Strain:   . Difficulty of Paying Living Expenses:   Food Insecurity:   . Worried About Charity fundraiser in the Last Year:   . Arboriculturist in the Last Year:   Transportation Needs:   . Film/video editor (Medical):   Marland Kitchen Lack of Transportation (Non-Medical):   Physical Activity:   . Days of Exercise per Week:   . Minutes of Exercise per Session:   Stress:   . Feeling of Stress :   Social Connections:   . Frequency of Communication with Friends and Family:   . Frequency of Social Gatherings  with Friends and Family:   . Attends Religious Services:   . Active Member of Clubs or Organizations:   . Attends Archivist Meetings:   Marland Kitchen Marital Status:   Intimate Partner Violence:   . Fear of Current or Ex-Partner:   . Emotionally Abused:   Marland Kitchen Physically Abused:   . Sexually Abused:     Outpatient Medications Prior to Visit  Medication Sig Dispense Refill  . MAGNESIUM PO Take 1 tablet by mouth daily.    . MULTIPLE VITAMIN PO Take 2 tablets by mouth daily. gummies    . phentermine (ADIPEX-P) 37.5 MG tablet Take 1 tablet (37.5 mg total) by mouth daily before breakfast. 30 tablet 0  . Vitamin D, Ergocalciferol, (DRISDOL) 1.25 MG (50000 UT) CAPS capsule Take 1 capsule (50,000 Units total) by mouth every 7 (seven) days. 12 capsule 0   No facility-administered medications prior to visit.    Allergies  Allergen Reactions  . Ivp Dye [Iodinated Diagnostic Agents] Hives  . Dolobid [Diflunisal]   . Percocet  [Oxycodone-Acetaminophen] Hives  . Codeine Anxiety  . Codeine Sulfate Anxiety    Review of Systems  Constitutional: Positive for activity change. Negative for chills and fever.  Musculoskeletal: Positive for gait problem and joint swelling. Negative for arthralgias, back pain, myalgias, neck pain and neck stiffness.  Skin: Positive for color change. Negative for pallor and wound.       Objective:    Physical Exam Vitals and nursing note reviewed.  Constitutional:      Appearance: Normal appearance. She is normal weight.  Eyes:     Extraocular Movements: Extraocular movements intact.     Conjunctiva/sclera: Conjunctivae normal.     Pupils: Pupils are equal, round, and reactive to light.  Pulmonary:     Effort: Pulmonary effort is normal.  Musculoskeletal:        General: Swelling, tenderness and signs of injury present. No deformity.     Cervical back: Normal range of motion.     Right knee: Normal.     Left knee: Swelling present. No crepitus. Decreased range of motion. Tenderness present over the medial joint line, lateral joint line, MCL and LCL. No ACL, PCL or patellar tendon tenderness. No LCL laxity, MCL laxity, ACL laxity or PCL laxity.Normal alignment and normal patellar mobility. Normal pulse.     Instability Tests: Negative medial McMurray test and negative lateral McMurray test.     Right lower leg: No swelling, deformity, lacerations, tenderness or bony tenderness. No edema.     Left lower leg: Swelling and tenderness present. No deformity, lacerations or bony tenderness. No edema.       Legs:     Comments: Negative Valgus stress test, Varus stress test, Lachman test, Anterior Drawer test, and Posterior Drawer test.  Patient able to ambulate independently and bear weight on leg.  Skin:    General: Skin is warm and dry.     Capillary Refill: Capillary refill takes less than 2 seconds.     Findings: Bruising present.  Neurological:     General: No focal deficit  present.     Mental Status: She is alert and oriented to person, place, and time.     Motor: No weakness.     Gait: Gait abnormal.  Psychiatric:        Mood and Affect: Mood normal.        Behavior: Behavior normal.        Thought Content: Thought content normal.  Judgment: Judgment normal.     BP (!) 152/92   Pulse 64   Temp 97.6 F (36.4 C) (Oral)   Ht 5\' 3"  (1.6 m)   Wt 170 lb 12.8 oz (77.5 kg)   SpO2 100%   BMI 30.26 kg/m  Wt Readings from Last 3 Encounters:  07/07/19 170 lb 12.8 oz (77.5 kg)  01/05/19 169 lb (76.7 kg)  09/20/18 169 lb (76.7 kg)    Health Maintenance Due  Topic Date Due  . HIV Screening  Never done    There are no preventive care reminders to display for this patient.   Lab Results  Component Value Date   TSH 1.48 01/05/2019   Lab Results  Component Value Date   WBC 5.6 01/05/2019   HGB 13.3 01/05/2019   HCT 40.3 01/05/2019   MCV 88.2 01/05/2019   PLT 253 01/05/2019   Lab Results  Component Value Date   NA 140 01/05/2019   K 4.6 01/05/2019   CO2 27 01/05/2019   GLUCOSE 103 (H) 01/05/2019   BUN 16 01/05/2019   CREATININE 0.75 01/05/2019   BILITOT 0.3 01/05/2019   ALKPHOS 49 02/27/2016   AST 16 01/05/2019   ALT 14 01/05/2019   PROT 6.8 01/05/2019   ALBUMIN 4.2 02/27/2016   CALCIUM 9.4 01/05/2019   ANIONGAP 4 (L) 09/19/2015   Lab Results  Component Value Date   CHOL 250 (H) 01/05/2019   Lab Results  Component Value Date   HDL 61 01/05/2019   Lab Results  Component Value Date   LDLCALC 158 (H) 01/05/2019   Lab Results  Component Value Date   TRIG 172 (H) 01/05/2019   Lab Results  Component Value Date   CHOLHDL 4.1 01/05/2019   Lab Results  Component Value Date   HGBA1C 5.9 (H) 01/05/2019       Assessment & Plan:   1. Injury of left knee, initial encounter Symptoms and presentation consistent with sprain of the medial collateral ligament with stretching of the lateral collateral ligament of the left  knee with possible medial meniscal involvement.  Valgus, varus, Lachman, McMurray, anterior drawer and posterior drawer tests all negative on examination.  There is evidence of slight decrease of range of motion as it causes discomfort in the medial portion of the knee.  Slight amount of edema and ecchymosis are noted over the distal portion of the medial knee surface.  Given the patient's presentation and symptoms at this time a complete ligament tear is highly unlikely. Discussed the option of the patient for MRI to evaluate potential damage.  She would like to avoid this if possible and start with conservative management.  I feel at this time based on presentation and symptoms conservative management is an acceptable approach. Patient instructed on immobilization of the joint, ice, and NSAIDs for the next 2 to 3 days.  Also instructed the patient to avoid extreme movement of the joint and to elevate the extremity as much as possible. Exercises provided to begin in 2 to 3 days when tolerated for medial and lateral ligament sprain and medial meniscal injury. Instructed the patient to notify the office if she is having worsening of symptoms or no improvement in the next 1 week to 10 days.  If self exercises are not effective we discussed the option of formal physical therapy referral.  If no improvement in 4 weeks we will consider imaging with MRI. Patient to follow-up in approximately 4 weeks.   No orders of the  defined types were placed in this encounter.    Orma Render, NP

## 2019-07-07 NOTE — Patient Instructions (Addendum)
Medial Collateral Knee Ligament Sprain  The medial collateral ligament (MCL) is a tough band of tissue in the knee that connects the thigh bone to the shin bone. Your MCL prevents your knee from moving too far inward and helps to keep your knee stable. An MCL sprain is a stretch or tear in the MCL. What are the causes? This condition may be caused by:  A hard, direct hit (trauma) to the inside of your knee. This is a common cause.  Your knee falling inward when you run, change directions quickly (cut), jump, or pivot.  Repeatedly overstretching the MCL. What increases the risk? The following factors make you more likely to develop this condition:  Playing contact sports, such as wrestling or football.  Participating in sports that involve sudden movements of cutting, twisting, or turning. These movements are common in hockey, skiing, and soccer.  Having weak hip and core muscles. What are the signs or symptoms? Symptoms of this condition include:  Feeling or hearing a popping at the time of injury.  Pain on the inside of the knee.  Swelling in the knee.  Bruising around the knee.  Tenderness when pressing the inside of the knee.  Feeling unstable when you stand, like your knee will give way.  Difficulty walking on uneven surfaces. How is this diagnosed? This condition may be diagnosed based on:  Your medical history.  A physical exam.  Imaging tests, such as an X-ray, ultrasound, or MRI. During your physical exam, your health care provider will check for pain, limited motion, and instability. How is this treated? Treatment for this condition depends on how severe the injury is. Treatment may include:  Keeping weight off the knee until swelling and pain improve.  Raising (elevating) the knee above the level of your heart. This helps to reduce swelling.  Icing the knee. This helps to reduce swelling.  Taking an NSAID, such as ibuprofen. This helps to reduce pain and  swelling.  Using a knee brace, elastic sleeve, or crutches while the injury heals.  Using a knee brace when participating in athletic activities.  Doing rehab exercises (physical therapy).  Surgery. This may be needed if: ? Your MCL tore all the way through. ? Your knee is unstable. ? Your knee is not getting better with other treatments. Follow these instructions at home: If you have a brace or sleeve:  Wear it as told by your health care provider. Remove it only as told by your health care provider.  Loosen the brace or remove the sleeve if your toes tingle, become numb, or turn cold and blue.  Keep the brace or sleeve clean.  If the brace or sleeve is not waterproof: ? Do not let it get wet. ? Cover it with a watertight covering when you take a bath or shower. Managing pain, stiffness, and swelling   If directed, put ice on the inside area of your knee. ? If you have a removable brace or sleeve, remove it as told by your health care provider. ? Put ice in a plastic bag. ? Place a towel between your skin and the bag. ? Leave the ice on for 20 minutes, 2-3 times a day.  Move your foot and toes often to reduce stiffness and swelling.  Elevate the injured area above the level of your heart while you are sitting or lying down. Activity  Ask your health care provider when it is safe to drive if you have a brace or sleeve  on your leg.  Return to your normal activities as told by your health care provider. Ask your health care provider what activities are safe for you.  Do exercises as told by your health care provider.  Do not use the injured leg to support your body weight until your health care provider says that you can. Use crutches as told by your health care provider. General instructions  Take over-the-counter and prescription medicines only as told by your health care provider.  Do not use any products that contain nicotine or tobacco, such as cigarettes,  e-cigarettes, and chewing tobacco. These can delay healing. If you need help quitting, ask your health care provider.  Keep all follow-up visits as told by your health care provider. This is important. How is this prevented?  Warm up and stretch before being active.  Cool down and stretch after being active.  Give your body time to rest between periods of activity.  Make sure to use equipment that fits you.  Be safe and responsible while being active. This will help you avoid falls.  Do at least 150 minutes of moderate-intensity exercise each week, such as brisk walking or water aerobics.  Maintain physical fitness, including: ? Strength. ? Flexibility. ? Cardiovascular fitness. ? Endurance. Contact a health care provider if:  Your symptoms do not improve.  Your symptoms get worse. Summary  An MCL sprain is a knee injury that is caused by stretching the MCL too far. The injury can involve a tear in the MCL.  Treatment for this condition depends on how severe the injury is. It may include rest, wearing a brace, or surgery.  Do not use the injured leg to support your body weight until your health care provider says that you can. Use crutches as told by your health care provider.  Contact a health care provider if your symptoms do not get better or they get worse.  Keep all follow-up visits as told by your health care provider. This is important. This information is not intended to replace advice given to you by your health care provider. Make sure you discuss any questions you have with your health care provider. Document Revised: 10/28/2017 Document Reviewed: 10/28/2017 Elsevier Patient Education  [place].    Meniscus Tear, Phase I Rehab After Surgery Ask your health care provider which exercises are safe for you. Do exercises exactly as told by your health care provider and adjust them as directed. It is normal to feel mild stretching, pulling, tightness, or  discomfort as you do these exercises. Stop right away if you feel sudden pain or your pain gets worse. Do not begin these exercises until told by your health care provider. If told by your health care provider, wear your brace while you do these exercises. Stretching and range-of-motion exercises These exercises warm up your muscles and joints and improve the movement and flexibility of your knee. These exercises also help to relieve pain and stiffness. You may be asked to limit your range of motion if you had a meniscus repair. Talk with your health care provider about these restrictions. Passive knee flexion, supine You do this exercise while lying on your back (supine position). 1. Start this exercise in one of these positions: ? Lying on the floor in front of an open doorway, with your left / right heel and foot lightly touching the wall. ? Lying on a bed with both of your feet on the wall or headboard. 2. Without using any  effort (passive), allow gravity to slide your foot down the wall slowly until you feel a gentle stretch (flexion) in the front of your left / right knee, or until your knee reaches the angle that your health care provider tells you. 3. Hold this stretch for __________ seconds. 4. Return your leg to the starting position, using your healthy leg to do the work or to help if needed. Repeat __________ times. Complete this exercise __________ times a day. Passive knee extension, sitting  1. Sit with your left / right heel propped up on a chair, a coffee table, or a footstool. Do not have anything under your knee to support it. 2. Without using any effort (passive), allow your leg muscles to relax, letting gravity straighten out your knee (extension). Do not let your knee roll inward. You should feel a stretch behind your left / right knee. 3. If told by your health care provider, deepen the stretch by placing a __________ weight on your thigh, just above your kneecap. 4. Hold this  position for __________ seconds. Repeat __________ times. Complete this exercise __________ times a day. Passive gastrocnemius stretch  1. Sit on the floor with your left / right leg extended. 2. Loop a belt or towel around the ball of your left / right foot. The ball of your foot is on the walking surface, right under your toes. 3. Keep your left / right ankle and foot relaxed and keep your knee straight. Without moving the foot yourself (passive), use the belt or towel to pull your foot and ankle toward you. You should feel a gentle stretch in your calf or behind your knee. 4. Hold this position for __________ seconds. Repeat __________ times. Complete this exercise __________ times a day. Strengthening exercises These exercises build strength and endurance in your knee. Endurance is the ability to use your muscles for a long time, even after they get tired. Quadriceps, isometric This exercise stretches the muscles in front of your thigh (quadriceps) without moving your knee joint (isometric). 1. Lie on your back with your left / right leg extended and your other knee bent. 2. Slowly tense the muscles in the front of your left / right thigh. You should see your kneecap slide up toward your hip or see increased dimpling just above the knee. This motion will push the back of your knee toward the floor. 3. For __________ seconds, hold the muscle as tight as you can without increasing your pain. 4. Relax the muscles slowly and completely. Repeat __________ times. Complete this exercise __________ times a day. Straight leg raises, supine This exercise strengthens the muscles in the front of your thigh (quadriceps). 1. Lie on your back (supine position) with your left / right leg extended and your other knee bent. 2. Tense the muscles in the front of your left / right thigh. You should see your kneecap slide up toward your hip or see increased dimpling just above the knee. 3. Keep these muscles  tight as you raise your leg 4-6 inches (10-15 cm) off the floor. Do not let your knee bend. 4. Hold this position for __________ seconds. 5. Keep these muscles tense as you lower your leg. 6. Relax these muscles slowly and completely after each repetition. Repeat __________ times. Complete this exercise __________ times a day. Straight leg raises, side-lying This exercise strengthens the muscles that rotate the leg at the hip and move it away from your body (hip abductors). 1. Lie on your side with  your left / right leg in the top position. Lie so your head, shoulder, hip, and knee line up. You may bend your lower knee to help you keep your balance. 2. Roll your hips slightly forward so your hips are stacked directly over each other and your left / right knee is facing forward. 3. Leading with your heel, lift your top leg 4-6 inches (10-15 cm), keeping your toes pointed straight ahead. ? Do not let your foot drift forward. ? Do not let your knee roll toward the ceiling. 4. Hold this position for __________ seconds. 5. Slowly lower your leg to the starting position. 6. Allow your muscles to relax completely after each repetition. Repeat __________ times. Complete this exercise __________ times a day. This information is not intended to replace advice given to you by your health care provider. Make sure you discuss any questions you have with your health care provider. Document Revised: 07/02/2018 Document Reviewed: 01/19/2018 Elsevier Patient Education  2020 [employer].

## 2019-08-04 ENCOUNTER — Ambulatory Visit (INDEPENDENT_AMBULATORY_CARE_PROVIDER_SITE_OTHER): Payer: No Typology Code available for payment source | Admitting: Nurse Practitioner

## 2019-08-04 DIAGNOSIS — Z5329 Procedure and treatment not carried out because of patient's decision for other reasons: Secondary | ICD-10-CM

## 2019-08-04 NOTE — Progress Notes (Signed)
Patient did not show for appointment for knee follow-up. No documentation today .

## 2019-08-05 ENCOUNTER — Encounter: Payer: Self-pay | Admitting: Nurse Practitioner

## 2019-08-05 ENCOUNTER — Other Ambulatory Visit: Payer: Self-pay

## 2019-08-05 ENCOUNTER — Ambulatory Visit (INDEPENDENT_AMBULATORY_CARE_PROVIDER_SITE_OTHER): Payer: No Typology Code available for payment source | Admitting: Nurse Practitioner

## 2019-08-05 ENCOUNTER — Ambulatory Visit (INDEPENDENT_AMBULATORY_CARE_PROVIDER_SITE_OTHER): Payer: No Typology Code available for payment source

## 2019-08-05 VITALS — BP 149/92 | HR 71 | Temp 98.0°F | Ht 63.0 in | Wt 166.8 lb

## 2019-08-05 DIAGNOSIS — Z1211 Encounter for screening for malignant neoplasm of colon: Secondary | ICD-10-CM

## 2019-08-05 DIAGNOSIS — S8992XD Unspecified injury of left lower leg, subsequent encounter: Secondary | ICD-10-CM

## 2019-08-05 NOTE — Patient Instructions (Signed)
We will get the xray today and get the MRI pre-approved through insurance. They will call you with an appointment time for MRI. It will likely be this weekend.   I will notify you of the results and let you know if the injury needs referral to orthopedics or if this is something that is healing and physical therapy may help with.   I did place the referral to orthopedics to get the ball rolling on that so you won't have to wait as long to get in if that is the route we decide to go.

## 2019-08-05 NOTE — Progress Notes (Signed)
Established Patient Office Visit  Subjective:  Patient ID: Wendy Anderson, female    DOB: 09-07-1970  Age: 49 y.o. MRN: [id number]  CC:  Chief Complaint  Patient presents with  . Knee Injury    HPI Wendy Anderson presents for four week follow-up for left knee injury that occurred after a fall in her home. She fell in her kitchen and her left leg went behind her body. She heard a loud pop sound and initially felt excruciating pain in her left knee. She was seen in the office and elected to attempt conservative management for possible lateral collateral medial ligament sprain or meniscal injury. She has been using ice, elevation, at home therapy exercises, and naproxen, but her symptoms have not significantly improved. She reports ecchymosis to the medial portion of the knee initially, which has resolved, but she is still experiencing some edema to the area.   She reports her pain is most prevalent with medial movement of her foot, most notably the toe of her foot. When she sits for long periods of time she reports significant pain with getting up and moving. She is also experiencing pain with moving to a squat position. She is able to ambulate but experiences some pain and significant popping of the knee when she walks. She is also experiencing decreased range of motion.   Past Medical History:  Diagnosis Date  . Migraine   . Prediabetes     Past Surgical History:  Procedure Laterality Date  . bladder tack    . BONE EXOSTOSIS EXCISION     removal from left femur(benign)  . PARTIAL HYSTERECTOMY    . removal of bone spurs     form 4th and 5th toes  . TERATOMA EXCISION     bilateral  . TOTAL HIP ARTHROPLASTY     right hip  . UMBILICAL HERNIA REPAIR      Family History  Problem Relation Age of Onset  . Lung cancer Mother        deceased at age 96  . Neurologic Disorder Father        Primary Progressive Aphasia  . Heart attack Maternal Grandfather   . Diabetes Paternal Grandfather    . Hyperlipidemia Paternal Grandfather   . Hypertension Paternal Grandfather   . Stroke Neg Hx     Social History   Socioeconomic History  . Marital status: Married    Spouse name: Not on file  . Number of children: Not on file  . Years of education: Not on file  . Highest education level: Not on file  Occupational History  . Not on file  Tobacco Use  . Smoking status: Never Smoker  . Smokeless tobacco: Never Used  Substance and Sexual Activity  . Alcohol use: Yes    Comment: socially  . Drug use: No  . Sexual activity: Yes    Partners: Male  Other Topics Concern  . Not on file  Social History Narrative  . Not on file   Social Determinants of Health   Financial Resource Strain:   . Difficulty of Paying Living Expenses:   Food Insecurity:   . Worried About Charity fundraiser in the Last Year:   . Arboriculturist in the Last Year:   Transportation Needs:   . Film/video editor (Medical):   Marland Kitchen Lack of Transportation (Non-Medical):   Physical Activity:   . Days of Exercise per Week:   . Minutes of Exercise per Session:   Stress:   .  Feeling of Stress :   Social Connections:   . Frequency of Communication with Friends and Family:   . Frequency of Social Gatherings with Friends and Family:   . Attends Religious Services:   . Active Member of Clubs or Organizations:   . Attends Archivist Meetings:   Marland Kitchen Marital Status:   Intimate Partner Violence:   . Fear of Current or Ex-Partner:   . Emotionally Abused:   Marland Kitchen Physically Abused:   . Sexually Abused:     Outpatient Medications Prior to Visit  Medication Sig Dispense Refill  . ketoconazole (NIZORAL) 2 % cream APPLY TOPICALLY TO AFFECTED AREA ONCE DAILY    . KRILL OIL PO Take by mouth daily.     Marland Kitchen MAGNESIUM PO Take 1 tablet by mouth daily.    . MULTIPLE VITAMIN PO Take 2 tablets by mouth daily. gummies    . naproxen (NAPROSYN) 500 MG tablet Take 500 mg by mouth 2 (two) times daily.    . Probiotic  Product (PROBIOTIC PO) Take by mouth.    . TURMERIC PO Take by mouth daily.     . Vitamin D, Ergocalciferol, (DRISDOL) 1.25 MG (50000 UT) CAPS capsule Take 1 capsule (50,000 Units total) by mouth every 7 (seven) days. 12 capsule 0  . phentermine (ADIPEX-P) 37.5 MG tablet Take 1 tablet (37.5 mg total) by mouth daily before breakfast. (Patient not taking: Reported on 08/05/2019) 30 tablet 0   No facility-administered medications prior to visit.    Allergies  Allergen Reactions  . Ivp Dye [Iodinated Diagnostic Agents] Hives  . Dolobid [Diflunisal]   . Percocet [Oxycodone-Acetaminophen] Hives  . Codeine Anxiety  . Codeine Sulfate Anxiety    ROS Review of Systems  Constitutional: Positive for activity change. Negative for appetite change, chills, fatigue and fever.  Musculoskeletal: Positive for arthralgias, gait problem, joint swelling and myalgias. Negative for back pain.  Neurological: Positive for weakness.  Hematological: Negative for adenopathy. Does not bruise/bleed easily.      Objective:    Physical Exam  Constitutional: She is oriented to person, place, and time. She appears well-developed and well-nourished.  HENT:  Head: Normocephalic and atraumatic.  Eyes: Pupils are equal, round, and reactive to light. Conjunctivae and EOM are normal.  Cardiovascular: Normal rate and intact distal pulses.  Pulmonary/Chest: Effort normal.  Musculoskeletal:        General: Tenderness and edema present.     Cervical back: Normal range of motion.     Left knee: Tenderness present over the medial joint line and MCL. No lateral joint line, LCL or patellar tendon tenderness. No MCL laxity.Normal patellar mobility.       Legs:  Neurological: She is alert and oriented to person, place, and time. She exhibits normal muscle tone. Coordination normal.  Skin: Skin is warm and dry. No erythema.  Psychiatric: She has a normal mood and affect. Her behavior is normal. Judgment and thought content  normal.  Nursing note and vitals reviewed.   BP (!) 149/92   Pulse 71   Temp 98 F (36.7 C) (Oral)   Ht 5\' 3"  (1.6 m)   Wt 166 lb 12.8 oz (75.7 kg)   SpO2 100%   BMI 29.55 kg/m  Wt Readings from Last 3 Encounters:  08/05/19 166 lb 12.8 oz (75.7 kg)  07/07/19 170 lb 12.8 oz (77.5 kg)  01/05/19 169 lb (76.7 kg)     Health Maintenance Due  Topic Date Due  . HIV Screening  Never done    There are no preventive care reminders to display for this patient.  Lab Results  Component Value Date   TSH 1.48 01/05/2019   Lab Results  Component Value Date   WBC 5.6 01/05/2019   HGB 13.3 01/05/2019   HCT 40.3 01/05/2019   MCV 88.2 01/05/2019   PLT 253 01/05/2019   Lab Results  Component Value Date   NA 140 01/05/2019   K 4.6 01/05/2019   CO2 27 01/05/2019   GLUCOSE 103 (H) 01/05/2019   BUN 16 01/05/2019   CREATININE 0.75 01/05/2019   BILITOT 0.3 01/05/2019   ALKPHOS 49 02/27/2016   AST 16 01/05/2019   ALT 14 01/05/2019   PROT 6.8 01/05/2019   ALBUMIN 4.2 02/27/2016   CALCIUM 9.4 01/05/2019   ANIONGAP 4 (L) 09/19/2015   Lab Results  Component Value Date   CHOL 250 (H) 01/05/2019   Lab Results  Component Value Date   HDL 61 01/05/2019   Lab Results  Component Value Date   LDLCALC 158 (H) 01/05/2019   Lab Results  Component Value Date   TRIG 172 (H) 01/05/2019   Lab Results  Component Value Date   CHOLHDL 4.1 01/05/2019   Lab Results  Component Value Date   HGBA1C 5.9 (H) 01/05/2019      Assessment & Plan:   1. Left knee injury, subsequent encounter Subsequent encounter for left knee injury after fall.  Injury appears to be consistent with damage to the medial collateral ligament and possibly meniscal injury involved.  The patient has trialed conservative treatment measures over the last 4 weeks but has not experienced significant improvement in her pain or range of motion. We did initially hold off on imaging but the joint decision was made today to  move forward with x-ray of the knee and MRI to determine the extent of the injury in place.  We went ahead and placed a referral to orthopedics in the event that she will need orthopedic care.  We did discuss the option of physical therapy if further orthopedic care is not required. We will follow up once results of testing have been received. - MR Knee Left  Wo Contrast; Future - DG Knee Complete 4 Views Left - AMB referral to orthopedics  2. Encounter for screening colonoscopy Patient due for colonoscopy.  Referral sent. - Ambulatory referral to Gastroenterology  Follow-up if symptoms worsen prior to obtaining image results.  Plan to follow-up based on imaging results with primary care office or orthopedics.  Orma Render, NP

## 2019-08-09 ENCOUNTER — Encounter: Payer: Self-pay | Admitting: Nurse Practitioner

## 2019-08-09 NOTE — Progress Notes (Signed)
Everything looked good on the xray- nothing abnormal . Have you heard anything about the MRI?

## 2019-09-02 ENCOUNTER — Other Ambulatory Visit: Payer: Self-pay

## 2019-09-02 ENCOUNTER — Encounter: Payer: Self-pay | Admitting: Physician Assistant

## 2019-09-02 ENCOUNTER — Ambulatory Visit (INDEPENDENT_AMBULATORY_CARE_PROVIDER_SITE_OTHER): Payer: No Typology Code available for payment source | Admitting: Physician Assistant

## 2019-09-02 VITALS — BP 120/83 | HR 95 | Ht 63.0 in | Wt 170.0 lb

## 2019-09-02 DIAGNOSIS — I10 Essential (primary) hypertension: Secondary | ICD-10-CM | POA: Diagnosis not present

## 2019-09-02 DIAGNOSIS — E6609 Other obesity due to excess calories: Secondary | ICD-10-CM

## 2019-09-02 DIAGNOSIS — E66811 Other obesity due to excess calories: Secondary | ICD-10-CM

## 2019-09-02 DIAGNOSIS — Z1159 Encounter for screening for other viral diseases: Secondary | ICD-10-CM | POA: Diagnosis not present

## 2019-09-02 DIAGNOSIS — Z114 Encounter for screening for human immunodeficiency virus [HIV]: Secondary | ICD-10-CM | POA: Diagnosis not present

## 2019-09-02 DIAGNOSIS — E785 Hyperlipidemia, unspecified: Secondary | ICD-10-CM

## 2019-09-02 DIAGNOSIS — Z6830 Body mass index (BMI) 30.0-30.9, adult: Secondary | ICD-10-CM

## 2019-09-02 DIAGNOSIS — R7303 Prediabetes: Secondary | ICD-10-CM

## 2019-09-02 NOTE — Patient Instructions (Signed)
The following can help better control your blood pressure in addition to medication:  Follow DASH diet (low sodium)  Do not smoke  Limit alcohol consumption to 12 ounces of beer, 5 ounces of wine, or 1 ounce of liquor per day  Exercise at least 30 minutes a day for at least 5 days a week to get your heart rate elevated  Decrease anxiety and stressor  Limit NSAIDs (Ibuprofen, Aleve, Naproxen, Motrin, Advil) as these can cause an increase your blood pressure    DASH Eating Plan DASH stands for "Dietary Approaches to Stop Hypertension." The DASH eating plan is a healthy eating plan that has been shown to reduce high blood pressure (hypertension). It may also reduce your risk for type 2 diabetes, heart disease, and stroke. The DASH eating plan may also help with weight loss. What are tips for following this plan?  General guidelines  Avoid eating more than 2,300 mg (milligrams) of salt (sodium) a day. If you have hypertension, you may need to reduce your sodium intake to 1,500 mg a day.  Limit alcohol intake to no more than 1 drink a day for nonpregnant women and 2 drinks a day for men. One drink equals 12 oz of beer, 5 oz of wine, or 1 oz of hard liquor.  Work with your health care provider to maintain a healthy body weight or to lose weight. Ask what an ideal weight is for you.  Get at least 30 minutes of exercise that causes your heart to beat faster (aerobic exercise) most days of the week. Activities may include walking, swimming, or biking.  Work with your health care provider or diet and nutrition specialist (dietitian) to adjust your eating plan to your individual calorie needs. Reading food labels   Check food labels for the amount of sodium per serving. Choose foods with less than 5 percent of the Daily Value of sodium. Generally, foods with less than 300 mg of sodium per serving fit into this eating plan.  To find whole grains, look for the word "whole" as the first word in  the ingredient list. Shopping  Buy products labeled as "low-sodium" or "no salt added."  Buy fresh foods. Avoid canned foods and premade or frozen meals. Cooking  Avoid adding salt when cooking. Use salt-free seasonings or herbs instead of table salt or sea salt. Check with your health care provider or pharmacist before using salt substitutes.  Do not fry foods. Cook foods using healthy methods such as baking, boiling, grilling, and broiling instead.  Cook with heart-healthy oils, such as olive, canola, soybean, or sunflower oil. Meal planning  Eat a balanced diet that includes: ? 5 or more servings of fruits and vegetables each day. At each meal, try to fill half of your plate with fruits and vegetables. ? Up to 6-8 servings of whole grains each day. ? Less than 6 oz of lean meat, poultry, or fish each day. A 3-oz serving of meat is about the same size as a deck of cards. One egg equals 1 oz. ? 2 servings of low-fat dairy each day. ? A serving of nuts, seeds, or beans 5 times each week. ? Heart-healthy fats. Healthy fats called Omega-3 fatty acids are found in foods such as flaxseeds and coldwater fish, like sardines, salmon, and mackerel.  Limit how much you eat of the following: ? Canned or prepackaged foods. ? Food that is high in trans fat, such as fried foods. ? Food that is high in  saturated fat, such as fatty meat. ? Sweets, desserts, sugary drinks, and other foods with added sugar. ? Full-fat dairy products.  Do not salt foods before eating.  Try to eat at least 2 vegetarian meals each week.  Eat more home-cooked food and less restaurant, buffet, and fast food.  When eating at a restaurant, ask that your food be prepared with less salt or no salt, if possible. What foods are recommended? The items listed may not be a complete list. Talk with your dietitian about what dietary choices are best for you. Grains Whole-grain or whole-wheat bread. Whole-grain or whole-wheat  pasta. Brown rice. [place]. Bulgur. Whole-grain and low-sodium cereals. Pita bread. Low-fat, low-sodium crackers. Whole-wheat flour tortillas. Vegetables Fresh or frozen vegetables (raw, steamed, roasted, or grilled). Low-sodium or reduced-sodium tomato and vegetable juice. Low-sodium or reduced-sodium tomato sauce and tomato paste. Low-sodium or reduced-sodium canned vegetables. Fruits All fresh, dried, or frozen fruit. Canned fruit in natural juice (without added sugar). Meat and other protein foods Skinless chicken or [place]. Ground chicken or [place]. Pork with fat trimmed off. Fish and seafood. Egg whites. Dried beans, peas, or lentils. Unsalted nuts, nut butters, and seeds. Unsalted canned beans. Lean cuts of beef with fat trimmed off. Low-sodium, lean deli meat. Dairy Low-fat (1%) or fat-free (skim) milk. Fat-free, low-fat, or reduced-fat cheeses. Nonfat, low-sodium ricotta or cottage cheese. Low-fat or nonfat yogurt. Low-fat, low-sodium cheese. Fats and oils Soft margarine without trans fats. Vegetable oil. Low-fat, reduced-fat, or light mayonnaise and salad dressings (reduced-sodium). Canola, safflower, olive, soybean, and sunflower oils. Avocado. Seasoning and other foods Herbs. Spices. Seasoning mixes without salt. Unsalted popcorn and pretzels. Fat-free sweets. What foods are not recommended? The items listed may not be a complete list. Talk with your dietitian about what dietary choices are best for you. Grains Baked goods made with fat, such as croissants, muffins, or some breads. Dry pasta or rice meal packs. Vegetables Creamed or fried vegetables. Vegetables in a cheese sauce. Regular canned vegetables (not low-sodium or reduced-sodium). Regular canned tomato sauce and paste (not low-sodium or reduced-sodium). Regular tomato and vegetable juice (not low-sodium or reduced-sodium). Angie Fava. Olives. Fruits Canned fruit in a light or heavy syrup. Fried fruit. Fruit in cream or  butter sauce. Meat and other protein foods Fatty cuts of meat. Ribs. Fried meat. Berniece Salines. Sausage. Bologna and other processed lunch meats. Salami. Fatback. Hotdogs. Bratwurst. Salted nuts and seeds. Canned beans with added salt. Canned or smoked fish. Whole eggs or egg yolks. Chicken or [place] with skin. Dairy Whole or 2% milk, cream, and half-and-half. Whole or full-fat cream cheese. Whole-fat or sweetened yogurt. Full-fat cheese. Nondairy creamers. Whipped toppings. Processed cheese and cheese spreads. Fats and oils Butter. Stick margarine. Lard. Shortening. Ghee. Bacon fat. Tropical oils, such as coconut, palm kernel, or palm oil. Seasoning and other foods Salted popcorn and pretzels. Onion salt, garlic salt, seasoned salt, table salt, and sea salt. Worcestershire sauce. Tartar sauce. Barbecue sauce. Teriyaki sauce. Soy sauce, including reduced-sodium. Steak sauce. Canned and packaged gravies. Fish sauce. Oyster sauce. Cocktail sauce. Horseradish that you find on the shelf. Ketchup. Mustard. Meat flavorings and tenderizers. Bouillon cubes. Hot sauce and Tabasco sauce. Premade or packaged marinades. Premade or packaged taco seasonings. Relishes. Regular salad dressings. Where to find more information:  National Heart, Lung, and [place]: https://wilson-eaton.com/  American Heart Association: www.heart.org Summary  The DASH eating plan is a healthy eating plan that has been shown to reduce high blood pressure (hypertension). It may also  reduce your risk for type 2 diabetes, heart disease, and stroke.  With the DASH eating plan, you should limit salt (sodium) intake to 2,300 mg a day. If you have hypertension, you may need to reduce your sodium intake to 1,500 mg a day.  When on the DASH eating plan, aim to eat more fresh fruits and vegetables, whole grains, lean proteins, low-fat dairy, and heart-healthy fats.  Work with your health care provider or diet and nutrition specialist (dietitian) to  adjust your eating plan to your individual calorie needs. This information is not intended to replace advice given to you by your health care provider. Make sure you discuss any questions you have with your health care provider. Document Revised: 02/20/2017 Document Reviewed: 03/03/2016 Elsevier Patient Education  2020 [employer].

## 2019-09-02 NOTE — Progress Notes (Signed)
Subjective:    Patient ID: Wendy Anderson, female    DOB: December 10, 1970, 49 y.o.   MRN: [id number]  HPI  Patient is a 49 year old female with a history of hypertension and not on medication who presents to the clinic with concerns.  Over the past few months patient has noticed a pressure in her head and when she checks her blood pressure it will be elevated in the 160s over 90s.  This usually resolves with time and then it will go back down in the 120s over 80s.  She has also randomly checked her blood pressure and seen it in the 140s over high 80s.  She denies any ongoing chest pain.  She did have one episode of chest pain at dinner after drinking a margarita and eating that was relieved with Tums.  She is just concerned her blood pressure is more elevated than it should.  She has been on medication in the past.  She did have a stress echo in 2017 that was normal.  She does admit to increased anxiety and stress in the last few months.  She also admits to weight gain.  She hurt her knee and has not been able to exercise like she should.  She is very frustrated with her weight and wonders if there is anything that she could do to help.  Patient is taking naproxen fairly regularly for knee pain.  .. Active Ambulatory Problems    Diagnosis Date Noted  . Status post right hip replacement 2012 [facility] 04/11/2013  . 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 Prediabetes 04/11/2013  . Tendinitis of left rotator cuff 04/11/2013  . Gluten intolerance 04/11/2013  . Vitamin D insufficiency 04/12/2013  . Hemorrhoids 04/12/2013  . Insomnia 04/12/2013  . Degenerative disc disease, cervical 04/12/2013  . Hematuria 04/12/2013  . Hyperlipidemia 11/11/2013  . Essential hypertension 12/07/2014  . Chest discomfort 03/28/2015  . Right shoulder pain 07/20/2015  . Stress at home 02/27/2016  . Anxiety state 02/27/2016  . 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 Cervical herniated disc 12/02/2016  . Suspicious nevus 05/20/2017  .  Neck fullness 05/24/2017  . Right hip pain 06/09/2017  . Thyroid cyst 06/18/2017  . Ganglion cyst of foot 09/20/2018  . Onychomycosis 09/20/2018  . Class 1 obesity due to excess calories without serious comorbidity with body mass index (BMI) of 30.0 to 30.9 in adult 09/02/2019   Resolved Ambulatory Problems    Diagnosis Date Noted  . No Resolved Ambulatory Problems   Past Medical History:  Diagnosis Date  . Migraine       Review of Systems See HPI.     Objective:   Physical Exam Vitals reviewed.  Constitutional:      Appearance: She is well-developed.  Cardiovascular:     Rate and Rhythm: Normal rate and regular rhythm.     Heart sounds: Normal heart sounds. No murmur heard.   Pulmonary:     Effort: Pulmonary effort is normal.     Breath sounds: Normal breath sounds. No wheezing.  Musculoskeletal:     Cervical back: Normal range of motion and neck supple.  Neurological:     Mental Status: She is alert and oriented to person, place, and time.  Psychiatric:        Mood and Affect: Mood normal.          Assessment & Plan:  Marland KitchenMarland KitchenDackeri was seen today for headache.  Diagnoses and all orders for this visit:  Essential hypertension -  COMPLETE METABOLIC PANEL WITH GFR -     TSH -     Cortisol, free, Serum -     Catecholamines, fractionated, Urine, 24 hour -     CBC -     Hemoglobin A1c -     Metanephrines, urine, 24 hour -     Catecholamines, Fractionated, Plasma -     Creatinine Clearance, Urine, 24 hour  Encounter for screening for HIV -     HIV Antibody (routine testing w rflx)  Encounter for hepatitis C screening test for low risk patient -     Hepatitis C antibody  Class 1 obesity due to excess calories without serious comorbidity with body mass index (BMI) of 30.0 to 30.9 in adult  Prediabetes -     Hemoglobin A1c   Reassured patient that blood pressure was perfect today.  There is a risk if we were to treat her elevations that she would drop  too low and feel terrible and or pass out.  I would like to hold off on blood pressure medication at this time.  Since her blood pressure is variable I think it is not unreasonable to screen for pheochromocytoma.  I would also like to check her CMP and kidney function.  Patient is aware that blood pressure elevations over time can do damage to organs.  We discussed DASH diet and regular exercise.  She is on regular naproxen right now for her knee.  This also could cause some blood pressure elevations.  Continue to keep blood pressure log to bring in in 2 months.  Patient is very concerned with her weight.  We discussed Saxenda.  She has had a history of prediabetes.  I would like to get an A1c and determine if she has any worsening hyperglycemia where as she might qualify for Ozempic.  I think a GLP-1 would be great for her weight loss.  Discussed biking and swimming as forms of exercise that are low impact on the knees.  Encourage at least 150 minutes of exercise a week.  Encourage portion control.  Follow-up in 2 months.

## 2019-09-11 LAB — COMPLETE METABOLIC PANEL WITH GFR
AG Ratio: 1.6 (calc) (ref 1.0–2.5)
ALT: 13 U/L (ref 6–29)
AST: 14 U/L (ref 10–35)
Albumin: 4.1 g/dL (ref 3.6–5.1)
Alkaline phosphatase (APISO): 69 U/L (ref 37–153)
BUN: 18 mg/dL (ref 7–25)
CO2: 24 mmol/L (ref 20–32)
Calcium: 9 mg/dL (ref 8.6–10.4)
Chloride: 105 mmol/L (ref 98–110)
Creat: 0.68 mg/dL (ref 0.50–1.05)
GFR, Est African American: 117 mL/min/{1.73_m2} (ref >=60)
GFR, Est Non African American: 101 mL/min/{1.73_m2} (ref >=60)
Globulin: 2.5 g/dL (calc) (ref 1.9–3.7)
Glucose, Bld: 107 mg/dL — ABNORMAL HIGH (ref 65–99)
Potassium: 4.1 mmol/L (ref 3.5–5.3)
Sodium: 138 mmol/L (ref 135–146)
Total Bilirubin: 0.3 mg/dL (ref 0.2–1.2)
Total Protein: 6.6 g/dL (ref 6.1–8.1)

## 2019-09-11 LAB — CREATININE CLEARANCE
Body Surface Area: 1.78
Creat: 0.68 mg/dL (ref 0.50–1.05)
Creatinine Clearance: 104 mL/min (ref 75–115)
Creatinine, 24H Ur: 1.05 g/(24.h) (ref 0.50–2.15)
GFR, Est African American: 117 mL/min/{1.73_m2} (ref >=60)
GFR, Est Non African American: 101 mL/min/{1.73_m2} (ref >=60)
Height Feet: 5 ft
Height Inches: 3 in
Weight Pounds: 165

## 2019-09-11 LAB — HEMOGLOBIN A1C
Hgb A1c MFr Bld: 5.8 % of total Hgb — ABNORMAL HIGH (ref <5.7)
Mean Plasma Glucose: 120 (calc)
eAG (mmol/L): 6.6 (calc)

## 2019-09-11 LAB — CBC
HCT: 39 % (ref 35.0–45.0)
Hemoglobin: 12.7 g/dL (ref 11.7–15.5)
MCH: 27.9 pg (ref 27.0–33.0)
MCHC: 32.6 g/dL (ref 32.0–36.0)
MCV: 85.7 fL (ref 80.0–100.0)
MPV: 10.4 fL (ref 7.5–12.5)
Platelets: 236 10*3/uL (ref 140–400)
RBC: 4.55 10*6/uL (ref 3.80–5.10)
RDW: 13 % (ref 11.0–15.0)
WBC: 4.9 10*3/uL (ref 3.8–10.8)

## 2019-09-11 LAB — HEPATITIS C ANTIBODY
Hepatitis C Ab: NONREACTIVE
SIGNAL TO CUT-OFF: 0 (ref <1.00)

## 2019-09-11 LAB — TSH: TSH: 1.74 mIU/L

## 2019-09-11 LAB — CATECHOLAMINES, FRACTIONATED, URINE, 24 HOUR
Calc Total (E+NE): 36 mcg/24 h (ref 26–121)
Creatinine, Urine mg/day-CATEUR: 1.05 g/(24.h) (ref 0.50–2.15)
Dopamine 24 Hr Urine: 135 mcg/24 h (ref 52–480)
Norepinephrine, 24H, Ur: 36 mcg/24 h (ref 15–100)
Total Volume: 1250 mL

## 2019-09-11 LAB — METANEPHRINES, URINE, 24 HOUR
Metaneph Total, Ur: 422 mcg/24 h (ref 224–832)
Metanephrines, Ur: 118 mcg/24 h (ref 90–315)
Normetanephrine, 24H Ur: 304 mcg/24 h (ref 122–676)
Volume, Urine-VMAUR: 1250 mL

## 2019-09-11 LAB — HIV ANTIBODY (ROUTINE TESTING W REFLEX): HIV 1&2 Ab, 4th Generation: NONREACTIVE

## 2019-09-11 LAB — CATECHOLAMINES, FRACTIONATED, PLASMA
Dopamine: 19 pg/mL
Epinephrine: <20 pg/mL
Norepinephrine: 312 pg/mL
Total Catecholamines: 331 pg/mL

## 2019-09-11 LAB — CORTISOL, FREE: Cortisol Free, Ser: 0.47 ug/dL

## 2019-09-12 ENCOUNTER — Encounter: Payer: Self-pay | Admitting: Physician Assistant

## 2019-09-12 DIAGNOSIS — R7303 Prediabetes: Secondary | ICD-10-CM

## 2019-09-12 DIAGNOSIS — E781 Pure hyperglyceridemia: Secondary | ICD-10-CM

## 2019-09-12 NOTE — Progress Notes (Signed)
DeDe,   A1C stable from last check at 5.8. still in pre-diabetes range.  Kidney, liver, thyroid all look good.  No evidence of any adrenal problems to cause BP elevation.

## 2019-09-13 ENCOUNTER — Telehealth: Payer: Self-pay | Admitting: Physician Assistant

## 2019-09-13 DIAGNOSIS — E781 Pure hyperglyceridemia: Secondary | ICD-10-CM | POA: Insufficient documentation

## 2019-09-13 MED ORDER — OZEMPIC (0.25 OR 0.5 MG/DOSE) 2 MG/1.5ML ~~LOC~~ SOPN: 0.5000 mg | PEN_INJECTOR | SUBCUTANEOUS | 2 refills | Status: DC

## 2019-09-13 NOTE — Telephone Encounter (Signed)
Received fax for PA on Ozempic sent through cover my meds waiting on determination. - CF °

## 2019-09-19 NOTE — Telephone Encounter (Signed)
Received fax from [employer] and they denied coverage on Ozempic due to patient must have diagnosis of Type 2 diabetes or intolerance to metformin. Placing in providers box for review. - CF

## 2019-09-21 ENCOUNTER — Telehealth: Payer: Self-pay | Admitting: Physician Assistant

## 2019-09-21 MED ORDER — SAXENDA 18 MG/3ML ~~LOC~~ SOPN: 0.6000 mg | PEN_INJECTOR | Freq: Every day | SUBCUTANEOUS | 1 refills | Status: DC

## 2019-09-21 NOTE — Telephone Encounter (Signed)
Sent saxenda.  Insurance denied ozempic for pre-diabetes

## 2019-09-28 ENCOUNTER — Telehealth: Payer: Self-pay | Admitting: Physician Assistant

## 2019-09-28 NOTE — Telephone Encounter (Signed)
Received fax for PA on Saxenda sent through cover my meds waiting on determination. - CF 

## 2019-10-03 ENCOUNTER — Encounter: Payer: Self-pay | Admitting: Physician Assistant

## 2019-10-03 MED ORDER — WEGOVY 0.25 MG/0.5ML ~~LOC~~ SOAJ: 0.2500 mg | SUBCUTANEOUS | 0 refills | Status: DC

## 2019-10-03 NOTE — Telephone Encounter (Signed)
Can you get wegovy prescribed for patient and all the info.   Start with .25mg  dose and then have her give me update on how she is doing week 3 or 4.

## 2019-10-05 NOTE — Telephone Encounter (Signed)
Received fax from [facility] they denied coverage on Saxenda due to it is excluded on patients plan placing in providers box for review. - CF

## 2019-10-06 ENCOUNTER — Encounter: Payer: Self-pay | Admitting: Physician Assistant

## 2019-10-13 LAB — HM COLONOSCOPY

## 2019-10-24 ENCOUNTER — Encounter: Payer: Self-pay | Admitting: Physician Assistant

## 2019-10-24 DIAGNOSIS — K5790 Diverticulosis of intestine, part unspecified, without perforation or abscess without bleeding: Secondary | ICD-10-CM | POA: Insufficient documentation

## 2019-11-02 ENCOUNTER — Encounter: Payer: Self-pay | Admitting: Physician Assistant

## 2020-01-09 ENCOUNTER — Other Ambulatory Visit (HOSPITAL_BASED_OUTPATIENT_CLINIC_OR_DEPARTMENT_OTHER): Payer: Self-pay | Admitting: Nurse Practitioner

## 2020-01-09 DIAGNOSIS — Z1231 Encounter for screening mammogram for malignant neoplasm of breast: Secondary | ICD-10-CM

## 2020-01-11 ENCOUNTER — Ambulatory Visit (INDEPENDENT_AMBULATORY_CARE_PROVIDER_SITE_OTHER): Payer: No Typology Code available for payment source

## 2020-01-11 ENCOUNTER — Other Ambulatory Visit: Payer: Self-pay

## 2020-01-11 DIAGNOSIS — Z1231 Encounter for screening mammogram for malignant neoplasm of breast: Secondary | ICD-10-CM | POA: Diagnosis not present

## 2020-01-16 ENCOUNTER — Ambulatory Visit (INDEPENDENT_AMBULATORY_CARE_PROVIDER_SITE_OTHER): Payer: No Typology Code available for payment source | Admitting: Physician Assistant

## 2020-01-16 ENCOUNTER — Other Ambulatory Visit: Payer: Self-pay

## 2020-01-16 ENCOUNTER — Encounter: Payer: Self-pay | Admitting: Physician Assistant

## 2020-01-16 VITALS — BP 138/88 | HR 77 | Ht 63.0 in | Wt 176.0 lb

## 2020-01-16 DIAGNOSIS — M791 Myalgia, unspecified site: Secondary | ICD-10-CM | POA: Diagnosis not present

## 2020-01-16 DIAGNOSIS — R202 Paresthesia of skin: Secondary | ICD-10-CM

## 2020-01-16 DIAGNOSIS — R7303 Prediabetes: Secondary | ICD-10-CM | POA: Diagnosis not present

## 2020-01-16 DIAGNOSIS — Z0000 Encounter for general adult medical examination without abnormal findings: Secondary | ICD-10-CM

## 2020-01-16 DIAGNOSIS — E559 Vitamin D deficiency, unspecified: Secondary | ICD-10-CM

## 2020-01-16 DIAGNOSIS — R768 Other specified abnormal immunological findings in serum: Secondary | ICD-10-CM

## 2020-01-16 DIAGNOSIS — E782 Mixed hyperlipidemia: Secondary | ICD-10-CM

## 2020-01-16 DIAGNOSIS — R7689 Other specified abnormal immunological findings in serum: Secondary | ICD-10-CM

## 2020-01-16 DIAGNOSIS — M2550 Pain in unspecified joint: Secondary | ICD-10-CM

## 2020-01-16 DIAGNOSIS — R252 Cramp and spasm: Secondary | ICD-10-CM

## 2020-01-16 NOTE — Patient Instructions (Signed)
Health Maintenance, Female Adopting a healthy lifestyle and getting preventive care are important in promoting health and wellness. Ask your health care provider about:  The right schedule for you to have regular tests and exams.  Things you can do on your own to prevent diseases and keep yourself healthy. What should I know about diet, weight, and exercise? Eat a healthy diet   Eat a diet that includes plenty of vegetables, fruits, low-fat dairy products, and lean protein.  Do not eat a lot of foods that are high in solid fats, added sugars, or sodium. Maintain a healthy weight Body mass index (BMI) is used to identify weight problems. It estimates body fat based on height and weight. Your health care provider can help determine your BMI and help you achieve or maintain a healthy weight. Get regular exercise Get regular exercise. This is one of the most important things you can do for your health. Most adults should:  Exercise for at least 150 minutes each week. The exercise should increase your heart rate and make you sweat (moderate-intensity exercise).  Do strengthening exercises at least twice a week. This is in addition to the moderate-intensity exercise.  Spend less time sitting. Even light physical activity can be beneficial. Watch cholesterol and blood lipids Have your blood tested for lipids and cholesterol at 51 years of age, then have this test every 5 years. Have your cholesterol levels checked more often if:  Your lipid or cholesterol levels are high.  You are older than 51 years of age.  You are at high risk for heart disease. What should I know about cancer screening? Depending on your health history and family history, you may need to have cancer screening at various ages. This may include screening for:  Breast cancer.  Cervical cancer.  Colorectal cancer.  Skin cancer.  Lung cancer. What should I know about heart disease, diabetes, and high blood  pressure? Blood pressure and heart disease  High blood pressure causes heart disease and increases the risk of stroke. This is more likely to develop in people who have high blood pressure readings, are of African descent, or are overweight.  Have your blood pressure checked: ? Every 3-5 years if you are 18-39 years of age. ? Every year if you are 40 years old or older. Diabetes Have regular diabetes screenings. This checks your fasting blood sugar level. Have the screening done:  Once every three years after age 40 if you are at a normal weight and have a low risk for diabetes.  More often and at a younger age if you are overweight or have a high risk for diabetes. What should I know about preventing infection? Hepatitis B If you have a higher risk for hepatitis B, you should be screened for this virus. Talk with your health care provider to find out if you are at risk for hepatitis B infection. Hepatitis C Testing is recommended for:  Everyone born from 1945 through 1965.  Anyone with known risk factors for hepatitis C. Sexually transmitted infections (STIs)  Get screened for STIs, including gonorrhea and chlamydia, if: ? You are sexually active and are younger than 51 years of age. ? You are older than 51 years of age and your health care provider tells you that you are at risk for this type of infection. ? Your sexual activity has changed since you were last screened, and you are at increased risk for chlamydia or gonorrhea. Ask your health care provider if   you are at risk.  Ask your health care provider about whether you are at high risk for HIV. Your health care provider may recommend a prescription medicine to help prevent HIV infection. If you choose to take medicine to prevent HIV, you should first get tested for HIV. You should then be tested every 3 months for as long as you are taking the medicine. Pregnancy  If you are about to stop having your period (premenopausal) and  you may become pregnant, seek counseling before you get pregnant.  Take 400 to 800 micrograms (mcg) of folic acid every day if you become pregnant.  Ask for birth control (contraception) if you want to prevent pregnancy. Osteoporosis and menopause Osteoporosis is a disease in which the bones lose minerals and strength with aging. This can result in bone fractures. If you are 65 years old or older, or if you are at risk for osteoporosis and fractures, ask your health care provider if you should:  Be screened for bone loss.  Take a calcium or vitamin D supplement to lower your risk of fractures.  Be given hormone replacement therapy (HRT) to treat symptoms of menopause. Follow these instructions at home: Lifestyle  Do not use any products that contain nicotine or tobacco, such as cigarettes, e-cigarettes, and chewing tobacco. If you need help quitting, ask your health care provider.  Do not use street drugs.  Do not share needles.  Ask your health care provider for help if you need support or information about quitting drugs. Alcohol use  Do not drink alcohol if: ? Your health care provider tells you not to drink. ? You are pregnant, may be pregnant, or are planning to become pregnant.  If you drink alcohol: ? Limit how much you use to 0-1 drink a day. ? Limit intake if you are breastfeeding.  Be aware of how much alcohol is in your drink. In the [place] one drink equals one 12 oz bottle of beer (355 mL), one 5 oz glass of wine (148 mL), or one 1 oz glass of hard liquor (44 mL). General instructions  Schedule regular health, dental, and eye exams.  Stay current with your vaccines.  Tell your health care provider if: ? You often feel depressed. ? You have ever been abused or do not feel safe at home. Summary  Adopting a healthy lifestyle and getting preventive care are important in promoting health and wellness.  Follow your health care provider's instructions about healthy  diet, exercising, and getting tested or screened for diseases.  Follow your health care provider's instructions on monitoring your cholesterol and blood pressure. This information is not intended to replace advice given to you by your health care provider. Make sure you discuss any questions you have with your health care provider. Document Revised: 03/03/2018 Document Reviewed: 03/03/2018 Elsevier Patient Education  2020 [employer]  

## 2020-01-16 NOTE — Progress Notes (Signed)
Subjective:    Patient ID: Wendy Anderson, female    DOB: [birth year], 49 y.o.   MRN: [id number]  HPI  Patient is a 49 year old female who presents to the clinic for annual physical.  Patient does not feel like Wendy Anderson is doing well.  Wendy Anderson continues to be aching all over.  Wendy Anderson reports to feel like an 49 year old woman.  Wendy Anderson is started gaining weight again.  Wendy Anderson has started back walking but does not seem to be helping as much.  Wendy Anderson feels like Wendy Anderson mood is often Wendy Anderson has no motivation.  Wendy Anderson is taking turmeric daily.  Wendy Anderson is unsure what labs need to be repeated.  Wendy Anderson feels like Wendy Anderson could have some type of autoimmune disease.  30 years ago a dermatologist found a suspicious lesion and sent Wendy Anderson to a rheumatologist.  Per patient told Wendy Anderson Wendy Anderson labs are fine but Wendy Anderson did some investigating it looks like Wendy Anderson scleroderma labs could have been slightly off.  Patient is having more pain in Wendy Anderson right hip where Wendy Anderson had it replaced.  Wendy Anderson has noticed this even more since Wendy Anderson J&J injection for Covid.  Wendy Anderson honestly aches from both hips down but more in the right.  Wendy Anderson has a stronger cold intolerance. Wendy Anderson has some intermittent hand swelling and hard to grasp things. No numbness or tingling of hands.    .. Active Ambulatory Problems    Diagnosis Date Noted  . Status post right hip replacement 2012 [facility] 04/11/2013  . 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 Pre-diabetes 04/11/2013  . Tendinitis of left rotator cuff 04/11/2013  . Gluten intolerance 04/11/2013  . Vitamin D insufficiency 04/12/2013  . Hemorrhoids 04/12/2013  . Insomnia 04/12/2013  . Degenerative disc disease, cervical 04/12/2013  . Hematuria 04/12/2013  . Hyperlipidemia 11/11/2013  . Essential hypertension 12/07/2014  . Chest discomfort 03/28/2015  . Right shoulder pain 07/20/2015  . Stress at home 02/27/2016  . Anxiety state 02/27/2016  . 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 Cervical herniated disc 12/02/2016  . Suspicious nevus 05/20/2017  . Neck fullness  05/24/2017  . Right hip pain 06/09/2017  . Thyroid cyst 06/18/2017  . Ganglion cyst of foot 09/20/2018  . Onychomycosis 09/20/2018  . Class 1 obesity due to excess calories without serious comorbidity with body mass index (BMI) of 30.0 to 30.9 in adult 09/02/2019  . Hyperglyceridemia 09/13/2019  . Diverticulosis 10/24/2019  . Arthralgia 01/30/2020  . Myalgia 01/30/2020  . Paresthesia 01/30/2020  . Positive ANA (antinuclear antibody) 01/30/2020   Resolved Ambulatory Problems    Diagnosis Date Noted  . No Resolved Ambulatory Problems   Past Medical History:  Diagnosis Date  . Migraine   . Prediabetes    .Marland Kitchen Family History  Problem Relation Age of Onset  . Lung cancer Mother        deceased at age 50  . Neurologic Disorder Father        Primary Progressive Aphasia  . Heart attack Maternal Grandfather   . Diabetes Paternal Grandfather   . Hyperlipidemia Paternal Grandfather   . Hypertension Paternal Grandfather   . Stroke Neg Hx    .Marland Kitchen Social History   Socioeconomic History  . Marital status: Married    Spouse name: Not on file  . Number of children: Not on file  . Years of education: Not on file  . Highest education level: Not on file  Occupational History  . Not on file  Tobacco Use  . Smoking status: Never Smoker  . Smokeless  tobacco: Never Used  Substance and Sexual Activity  . Alcohol use: Yes    Comment: socially  . Drug use: No  . Sexual activity: Yes    Partners: Male  Other Topics Concern  . Not on file  Social History Narrative  . Not on file   Social Determinants of Health   Financial Resource Strain:   . Difficulty of Paying Living Expenses: Not on file  Food Insecurity:   . Worried About Charity fundraiser in the Last Year: Not on file  . Ran Out of Food in the Last Year: Not on file  Transportation Needs:   . Lack of Transportation (Medical): Not on file  . Lack of Transportation (Non-Medical): Not on file  Physical Activity:   . Days of  Exercise per Week: Not on file  . Minutes of Exercise per Session: Not on file  Stress:   . Feeling of Stress : Not on file  Social Connections:   . Frequency of Communication with Friends and Family: Not on file  . Frequency of Social Gatherings with Friends and Family: Not on file  . Attends Religious Services: Not on file  . Active Member of Clubs or Organizations: Not on file  . Attends Archivist Meetings: Not on file  . Marital Status: Not on file  Intimate Partner Violence:   . Fear of Current or Ex-Partner: Not on file  . Emotionally Abused: Not on file  . Physically Abused: Not on file  . Sexually Abused: Not on file       Review of Systems See HPI.     Objective:   Physical Exam Vitals reviewed.  Constitutional:      Appearance: Normal appearance.  HENT:     Head: Normocephalic.     Right Ear: Tympanic membrane, ear canal and external ear normal. There is no impacted cerumen.     Left Ear: Tympanic membrane, ear canal and external ear normal. There is no impacted cerumen.     Nose: Nose normal.     Mouth/Throat:     Mouth: Mucous membranes are moist.  Eyes:     Extraocular Movements: Extraocular movements intact.     Conjunctiva/sclera: Conjunctivae normal.     Pupils: Pupils are equal, round, and reactive to light.  Neck:     Vascular: No carotid bruit.  Cardiovascular:     Rate and Rhythm: Normal rate and regular rhythm.     Pulses: Normal pulses.     Heart sounds: Normal heart sounds.  Pulmonary:     Effort: Pulmonary effort is normal.     Breath sounds: Normal breath sounds.  Abdominal:     General: Bowel sounds are normal. There is no distension.     Palpations: Abdomen is soft.     Tenderness: There is no abdominal tenderness. There is no right CVA tenderness, left CVA tenderness or guarding.  Musculoskeletal:     Cervical back: Normal range of motion and neck supple.     Right lower leg: No edema.     Left lower leg: No edema.   Lymphadenopathy:     Cervical: No cervical adenopathy.  Neurological:     General: No focal deficit present.     Mental Status: Wendy Anderson is alert and oriented to person, place, and time.  Psychiatric:        Mood and Affect: Mood normal.        Behavior: Behavior normal.     .. Depression  screen [facility] 2/9 01/16/2020 01/05/2019 05/20/2017 12/02/2016  Decreased Interest 0 0 0 0  Down, Depressed, Hopeless 0 0 0 0  PHQ - 2 Score 0 0 0 0  Altered sleeping - 1 - -  Tired, decreased energy - 1 - -  Change in appetite - 0 - -  Feeling bad or failure about yourself  - 0 - -  Trouble concentrating - 0 - -  Moving slowly or fidgety/restless - 0 - -  Suicidal thoughts - 0 - -  PHQ-9 Score - 2 - -  Difficult doing work/chores - Not difficult at all - -   .Marland Kitchen GAD 7 : Generalized Anxiety Score 01/05/2019  Nervous, Anxious, on Edge 0  Control/stop worrying 0  Worry too much - different things 0  Trouble relaxing 0  Restless 0  Easily annoyed or irritable 0  Afraid - awful might happen 0  Total GAD 7 Score 0  Anxiety Difficulty Not difficult at all          Assessment & Plan:  Marland KitchenMarland KitchenDackeri was seen today for annual exam.  Diagnoses and all orders for this visit:  Routine physical examination -     Thyroid Peroxidase Antibodies (TPO) (REFL) -     Heavy Metals Panel, Blood -     ANA -     Anti-Scleroderma Antibody -     COMPLETE METABOLIC PANEL WITH GFR -     Sed Rate (ESR) -     C-reactive protein -     VITAMIN D 25 Hydroxy (Vit-D Deficiency, Fractures) -     B12 and Folate Panel -     Lipid Panel w/reflex Direct LDL -     Hemoglobin A1c -     Rheumatoid factor -     Cyclic citrul peptide antibody, IgG -     B. burgdorfi antibodies -     CBC  Paresthesia -     Thyroid Peroxidase Antibodies (TPO) (REFL) -     Heavy Metals Panel, Blood -     ANA -     Sed Rate (ESR) -     C-reactive protein -     B12 and Folate Panel  Pre-diabetes -     Hemoglobin A1c  Myalgia -      Thyroid Peroxidase Antibodies (TPO) (REFL) -     Heavy Metals Panel, Blood -     ANA -     Anti-Scleroderma Antibody -     COMPLETE METABOLIC PANEL WITH GFR -     Sed Rate (ESR) -     C-reactive protein -     VITAMIN D 25 Hydroxy (Vit-D Deficiency, Fractures) -     B12 and Folate Panel -     Hemoglobin A1c -     Rheumatoid factor -     Cyclic citrul peptide antibody, IgG -     B. burgdorfi antibodies -     CBC  Arthralgia, unspecified joint -     Thyroid Peroxidase Antibodies (TPO) (REFL) -     Heavy Metals Panel, Blood -     ANA -     Anti-Scleroderma Antibody -     COMPLETE METABOLIC PANEL WITH GFR -     Sed Rate (ESR) -     C-reactive protein -     VITAMIN D 25 Hydroxy (Vit-D Deficiency, Fractures) -     B12 and Folate Panel -     Hemoglobin A1c -     Rheumatoid  factor -     Cyclic citrul peptide antibody, IgG -     B. burgdorfi antibodies -     CBC  Mixed hyperlipidemia -     Lipid Panel w/reflex Direct LDL  Vitamin D insufficiency  Positive ANA (antinuclear antibody) -     Anti-nuclear ab-titer (ANA titer)   .Marland Kitchen Discussed 150 minutes of exercise a week.  Encouraged vitamin D 1000 units and Calcium 1377m or 4 servings of dairy a day.  Fasting labs ordered.  Pap UTD.  Mammogram UTD.  Colonoscopy UTD.  DeclinesTdap.  Needs shingles.    Unclear etiology of why patient feels achy, tired, GI issues bone pain.  Will rerun labs.  Discussed CFS and fibromyalgia.  Encouraged anti-inflammatory diet.  Could consider cymbalta for pain. Continue tumeric.    Follow up 3 months or sooner if labs show any warning signs.

## 2020-01-17 NOTE — Progress Notes (Signed)
Wendy Anderson,   Some labs still pending.   Kidney, liver, glucose look good.  Inflammation rate low and good.  Vitamin D fantastic.  B12 perfect.  Cholesterol stable. HDL great. LDL and TG still elevated.  A1C is pre diabetes but up from 4 months ago.  Negative for lymes. Negative for rheumatoid arthritis. Normal hemoglobin.

## 2020-01-18 LAB — VITAMIN D 25 HYDROXY (VIT D DEFICIENCY, FRACTURES): Vit D, 25-Hydroxy: 72 ng/mL (ref 30–100)

## 2020-01-18 LAB — B12 AND FOLATE PANEL
Folate: 14.1 ng/mL
Vitamin B-12: 506 pg/mL (ref 200–1100)

## 2020-01-18 LAB — COMPLETE METABOLIC PANEL WITH GFR
AG Ratio: 1.6 (calc) (ref 1.0–2.5)
ALT: 15 U/L (ref 6–29)
AST: 17 U/L (ref 10–35)
Albumin: 4.2 g/dL (ref 3.6–5.1)
Alkaline phosphatase (APISO): 60 U/L (ref 37–153)
BUN: 14 mg/dL (ref 7–25)
CO2: 30 mmol/L (ref 20–32)
Calcium: 9.7 mg/dL (ref 8.6–10.4)
Chloride: 105 mmol/L (ref 98–110)
Creat: 0.76 mg/dL (ref 0.50–1.05)
GFR, Est African American: 105 mL/min/{1.73_m2} (ref >=60)
GFR, Est Non African American: 91 mL/min/{1.73_m2} (ref >=60)
Globulin: 2.7 g/dL (calc) (ref 1.9–3.7)
Glucose, Bld: 96 mg/dL (ref 65–99)
Potassium: 5.1 mmol/L (ref 3.5–5.3)
Sodium: 139 mmol/L (ref 135–146)
Total Bilirubin: 0.3 mg/dL (ref 0.2–1.2)
Total Protein: 6.9 g/dL (ref 6.1–8.1)

## 2020-01-18 LAB — HEAVY METALS PANEL, BLOOD
Arsenic: <10 mcg/L (ref <23)
Lead: <1 ug/dL (ref <5)
Mercury, B: <5 mcg/L (ref 0–10)

## 2020-01-18 LAB — CBC
HCT: 37.8 % (ref 35.0–45.0)
Hemoglobin: 12.3 g/dL (ref 11.7–15.5)
MCH: 28.2 pg (ref 27.0–33.0)
MCHC: 32.5 g/dL (ref 32.0–36.0)
MCV: 86.7 fL (ref 80.0–100.0)
MPV: 9.9 fL (ref 7.5–12.5)
Platelets: 293 10*3/uL (ref 140–400)
RBC: 4.36 10*6/uL (ref 3.80–5.10)
RDW: 12.1 % (ref 11.0–15.0)
WBC: 3.9 10*3/uL (ref 3.8–10.8)

## 2020-01-18 LAB — LIPID PANEL W/REFLEX DIRECT LDL
Cholesterol: 237 mg/dL — ABNORMAL HIGH (ref <200)
HDL: 52 mg/dL (ref >=50)
LDL Cholesterol (Calc): 153 mg/dL (calc) — ABNORMAL HIGH
Non-HDL Cholesterol (Calc): 185 mg/dL (calc) — ABNORMAL HIGH (ref <130)
Total CHOL/HDL Ratio: 4.6 (calc) (ref <5.0)
Triglycerides: 188 mg/dL — ABNORMAL HIGH (ref <150)

## 2020-01-18 LAB — ANTI-NUCLEAR AB-TITER (ANA TITER): ANA Titer 1: 1:320 {titer} — ABNORMAL HIGH

## 2020-01-18 LAB — THYROID PEROXIDASE ANTIBODIES (TPO) (REFL): Thyroperoxidase Ab SerPl-aCnc: <1 IU/mL (ref <9)

## 2020-01-18 LAB — HEMOGLOBIN A1C
Hgb A1c MFr Bld: 6.1 % of total Hgb — ABNORMAL HIGH (ref <5.7)
Mean Plasma Glucose: 128 (calc)
eAG (mmol/L): 7.1 (calc)

## 2020-01-18 LAB — RHEUMATOID FACTOR: Rheumatoid fact SerPl-aCnc: <14 IU/mL (ref <14)

## 2020-01-18 LAB — CYCLIC CITRUL PEPTIDE ANTIBODY, IGG: Cyclic Citrullin Peptide Ab: <16 UNITS

## 2020-01-18 LAB — C-REACTIVE PROTEIN: CRP: 4.3 mg/L (ref <8.0)

## 2020-01-18 LAB — SEDIMENTATION RATE: Sed Rate: 17 mm/h (ref 0–30)

## 2020-01-18 LAB — B. BURGDORFI ANTIBODIES: B burgdorferi Ab IgG+IgM: <0.9 index

## 2020-01-18 LAB — ANTI-SCLERODERMA ANTIBODY: Scleroderma (Scl-70) (ENA) Antibody, IgG: <1 AI

## 2020-01-18 LAB — ANA: Anti Nuclear Antibody (ANA): POSITIVE — AB

## 2020-01-18 NOTE — Progress Notes (Signed)
DeDe,   Scleroderma labs are negative. Thyroid antibodies are negative.   So far really good labs.

## 2020-01-19 ENCOUNTER — Encounter: Payer: Self-pay | Admitting: Physician Assistant

## 2020-01-19 NOTE — Progress Notes (Signed)
DeDe,   Heavy metals negative.   Your ANA does come back positive. It is the dense fine speckled pattern that is more associated with normal labs than with SLE, Sjogrens or sclerosis. Would you like to speak with another rheumatologist that could speak to this?

## 2020-01-27 ENCOUNTER — Telehealth: Payer: Self-pay | Admitting: Gastroenterology

## 2020-01-27 NOTE — Telephone Encounter (Signed)
Patient had direct access colonoscopy completed at [facility] in 09/2019.  Now having referral for abdominal bloating by her PCM.  Patient can certainly be seen by [facility], but if she would prefer to change her GI to [facility], can set up appointment in the [facility] clinic.  Thank you.

## 2020-01-27 NOTE — Telephone Encounter (Signed)
Hey Dr Bryan Lemma, this pt is requesting an appt to see you for "leaky gut" but it looks like this pt had a colonoscopy done recently with [facility] on 09/2019, Records are in epic for review, please advise on scheduling

## 2020-01-30 DIAGNOSIS — R768 Other specified abnormal immunological findings in serum: Secondary | ICD-10-CM | POA: Insufficient documentation

## 2020-01-30 DIAGNOSIS — M2550 Pain in unspecified joint: Secondary | ICD-10-CM | POA: Insufficient documentation

## 2020-01-30 DIAGNOSIS — R202 Paresthesia of skin: Secondary | ICD-10-CM | POA: Insufficient documentation

## 2020-01-30 DIAGNOSIS — R252 Cramp and spasm: Secondary | ICD-10-CM | POA: Insufficient documentation

## 2020-01-30 DIAGNOSIS — M791 Myalgia, unspecified site: Secondary | ICD-10-CM | POA: Insufficient documentation

## 2020-01-30 DIAGNOSIS — M25552 Pain in left hip: Secondary | ICD-10-CM | POA: Insufficient documentation

## 2020-02-02 NOTE — Telephone Encounter (Signed)
Left msg on pt's vm to call back and schedule 

## 2020-02-06 ENCOUNTER — Ambulatory Visit (HOSPITAL_COMMUNITY): Payer: No Typology Code available for payment source

## 2020-02-15 ENCOUNTER — Encounter: Payer: Self-pay | Admitting: Family Medicine

## 2020-02-15 ENCOUNTER — Telehealth (INDEPENDENT_AMBULATORY_CARE_PROVIDER_SITE_OTHER): Payer: No Typology Code available for payment source | Admitting: Family Medicine

## 2020-02-15 DIAGNOSIS — J069 Acute upper respiratory infection, unspecified: Secondary | ICD-10-CM | POA: Diagnosis not present

## 2020-02-15 MED ORDER — PREDNISONE 20 MG PO TABS: 20.0000 mg | ORAL_TABLET | Freq: Two times a day (BID) | ORAL | 0 refills | Status: AC

## 2020-02-15 MED ORDER — ALBUTEROL SULFATE HFA 108 (90 BASE) MCG/ACT IN AERS: 2.0000 | INHALATION_SPRAY | Freq: Four times a day (QID) | RESPIRATORY_TRACT | 0 refills | Status: DC | PRN

## 2020-02-15 NOTE — Assessment & Plan Note (Signed)
She had negative rapid COVID test.  Recommend that if symptoms continue to worsen she have this repeated or have PCR test.  Recommend supportive care at home with increased fluids and rest.  She can use OTC mucinex as needed.  She should avoid decongestants due to elevated BP.   She has noticed occasional wheezing and I sent over prednisone and albuterol for her to start if wheezing worsens as we will not be open over the next 4 days due to the holiday.  She is aware to see urgent care or visit ER if symptoms are significantly worsening

## 2020-02-15 NOTE — Progress Notes (Signed)
Symptoms started yesterday 11/23 am.  Sore throat Post nasal drip Head congestion (difficulty breathing) Fatigue  3 people in the family are currently COVID positive. Tested negative yesterday via [facility] @ [facility]. Took Afrin.

## 2020-02-15 NOTE — Progress Notes (Signed)
Wendy Anderson - 49 y.o. female MRN [id number]  Date of birth: [birth year]   This visit type was conducted due to national recommendations for restrictions regarding the COVID-19 Pandemic (e.g. social distancing).  This format is felt to be most appropriate for this patient at this time.  All issues noted in this document were discussed and addressed.  No physical exam was performed (except for noted visual exam findings with Video Visits).  I discussed the limitations of evaluation and management by telemedicine and the availability of in person appointments. The patient expressed understanding and agreed to proceed.  I connected with@ on 02/15/20 at  1:40 PM EST by a video enabled telemedicine application and verified that I am speaking with the correct person using two identifiers.  Present at visit: Wendy Nutting, DO Wendy Anderson   Patient Location: Home [address] [place] [address]   Provider location:   [facility]  Chief Complaint  Patient presents with  . Nasal Congestion    HPI  Wendy Anderson is a 49 y.o. female who presents via Engineer, civil (consulting) for a telehealth visit today.  She has complaint of nasal congestion and sore throat today.  She also has some mild fatigue.  Symptoms started yesterday.  She denies fever, chills, shortness of breath, headache, body aches, nausea, vomiting, diarrhea.  Her husband has similar symptoms as well.  [facility] which was [facility].  She did travel by air to [place] for a football game over the weekend.  She also reports that a couple of her children recently tested positive for COVID.   ROS:  A comprehensive ROS was completed and negative except as noted per HPI  Past Medical History:  Diagnosis Date  . Migraine   . Prediabetes     Past Surgical History:  Procedure Laterality Date  . bladder tack    . BONE EXOSTOSIS EXCISION     removal from left femur(benign)  . PARTIAL HYSTERECTOMY     . removal of bone spurs     form 4th and 5th toes  . TERATOMA EXCISION     bilateral  . TOTAL HIP ARTHROPLASTY     right hip  . UMBILICAL HERNIA REPAIR      Family History  Problem Relation Age of Onset  . Lung cancer Mother        deceased at age 70  . Neurologic Disorder Father        Primary Progressive Aphasia  . Heart attack Maternal Grandfather   . Diabetes Paternal Grandfather   . Hyperlipidemia Paternal Grandfather   . Hypertension Paternal Grandfather   . Stroke Neg Hx     Social History   Socioeconomic History  . Marital status: Married    Spouse name: Not on file  . Number of children: Not on file  . Years of education: Not on file  . Highest education level: Not on file  Occupational History  . Not on file  Tobacco Use  . Smoking status: Never Smoker  . Smokeless tobacco: Never Used  Substance and Sexual Activity  . Alcohol use: Yes    Comment: socially  . Drug use: No  . Sexual activity: Yes    Partners: Male  Other Topics Concern  . Not on file  Social History Narrative  . Not on file   Social Determinants of Health   Financial Resource Strain:   . Difficulty of Paying Living Expenses: Not on file  Food  Insecurity:   . Worried About Charity fundraiser in the Last Year: Not on file  . Ran Out of Food in the Last Year: Not on file  Transportation Needs:   . Lack of Transportation (Medical): Not on file  . Lack of Transportation (Non-Medical): Not on file  Physical Activity:   . Days of Exercise per Week: Not on file  . Minutes of Exercise per Session: Not on file  Stress:   . Feeling of Stress : Not on file  Social Connections:   . Frequency of Communication with Friends and Family: Not on file  . Frequency of Social Gatherings with Friends and Family: Not on file  . Attends Religious Services: Not on file  . Active Member of Clubs or Organizations: Not on file  . Attends Archivist Meetings: Not on file  . Marital Status:  Not on file  Intimate Partner Violence:   . Fear of Current or Ex-Partner: Not on file  . Emotionally Abused: Not on file  . Physically Abused: Not on file  . Sexually Abused: Not on file     Current Outpatient Medications:  .  cholecalciferol (VITAMIN D3) 25 MCG (1000 UNIT) tablet, Take 5,000 Units by mouth daily., Disp: , Rfl:  .  KRILL OIL PO, Take by mouth daily. , Disp: , Rfl:  .  MAGNESIUM PO, Take 1 tablet by mouth daily., Disp: , Rfl:  .  MULTIPLE VITAMIN PO, Take 2 tablets by mouth daily. gummies, Disp: , Rfl:  .  Probiotic Product (PROBIOTIC PO), Take by mouth., Disp: , Rfl:  .  TURMERIC PO, Take by mouth daily. , Disp: , Rfl:  .  albuterol (VENTOLIN HFA) 108 (90 Base) MCG/ACT inhaler, Inhale 2 puffs into the lungs every 6 (six) hours as needed for wheezing or shortness of breath., Disp: 8 g, Rfl: 0 .  predniSONE (DELTASONE) 20 MG tablet, Take 1 tablet (20 mg total) by mouth 2 (two) times daily with a meal for 5 days., Disp: 10 tablet, Rfl: 0  EXAM:  VITALS per patient if applicable: BP (!) 621/308   Pulse 97   SpO2 98%   GENERAL: alert, oriented, she sounds congested  HEENT: atraumatic, conjunttiva clear, no obvious abnormalities on inspection of external nose and ears  NECK: normal movements of the head and neck  LUNGS: on inspection no signs of respiratory distress, breathing rate appears normal, no obvious gross SOB, gasping or wheezing  CV: no obvious cyanosis  MS: moves all visible extremities without noticeable abnormality  PSYCH/NEURO: pleasant and cooperative, no obvious depression or anxiety, speech and thought processing grossly intact  ASSESSMENT AND PLAN:  Discussed the following assessment and plan:  URI (upper respiratory infection) She had negative rapid COVID test.  Recommend that if symptoms continue to worsen she have this repeated or have PCR test.  Recommend supportive care at home with increased fluids and rest.  She can use OTC mucinex as  needed.  She should avoid decongestants due to elevated BP.   She has noticed occasional wheezing and I sent over prednisone and albuterol for her to start if wheezing worsens as we will not be open over the next 4 days due to the holiday.  She is aware to see urgent care or visit ER if symptoms are significantly worsening     I discussed the assessment and treatment plan with the patient. The patient was provided an opportunity to ask questions and all were answered. The patient  agreed with the plan and demonstrated an understanding of the instructions.   The patient was advised to call back or seek an in-person evaluation if the symptoms worsen or if the condition fails to improve as anticipated.    Wendy Nutting, DO

## 2020-02-19 ENCOUNTER — Encounter: Payer: Self-pay | Admitting: Family Medicine

## 2020-02-20 ENCOUNTER — Encounter: Payer: Self-pay | Admitting: Physician Assistant

## 2020-02-20 ENCOUNTER — Other Ambulatory Visit: Payer: Self-pay | Admitting: Family Medicine

## 2020-02-20 ENCOUNTER — Ambulatory Visit (INDEPENDENT_AMBULATORY_CARE_PROVIDER_SITE_OTHER): Payer: No Typology Code available for payment source

## 2020-02-20 ENCOUNTER — Other Ambulatory Visit: Payer: Self-pay | Admitting: Physician Assistant

## 2020-02-20 ENCOUNTER — Other Ambulatory Visit: Payer: Self-pay

## 2020-02-20 DIAGNOSIS — U071 COVID-19: Secondary | ICD-10-CM

## 2020-02-20 DIAGNOSIS — E669 Obesity, unspecified: Secondary | ICD-10-CM

## 2020-02-20 NOTE — Telephone Encounter (Signed)
Called pt and explained possible monoclonal antibody treatment. Sx started 11/22. Tested positive 11/28 with a home test, then rapid test at [facility] in [place], and [facility] (waiting on those results). Sx include SOB with exertion fever, cough, body aches, chills, sore throat, headache, and diarrhea. She said the diarrhea could be related to the [facility] they have taken. I encouraged them to stop taking it. Qualifying risk factors BMI 30.1, borderline DM2 and HTN. Pt interested in tx. Informed pt an APP will call back to possibly schedule an appointment. Pt vaccinated 11/21/19 J&J.

## 2020-02-20 NOTE — Progress Notes (Signed)
I connected by phone with Wendy Anderson on 02/20/2020 at 1:40 PM to discuss the potential use of a new treatment for mild to moderate COVID-19 viral infection in non-hospitalized patients.  This patient is a 49 y.o. female that meets the FDA criteria for Emergency Use Authorization of COVID monoclonal antibody casirivimab/imdevimab, bamlanivimab/eteseviamb, or sotrovimab.  Has a (+) direct SARS-CoV-2 viral test result  Has mild or moderate COVID-19   Is NOT hospitalized due to COVID-19  Is within 10 days of symptom onset  Has at least one of the high risk factor(s) for progression to severe COVID-19 and/or hospitalization as defined in EUA.  Specific high risk criteria : BMI > 25   I have spoken and communicated the following to the patient or parent/caregiver regarding COVID monoclonal antibody treatment:  1. FDA has authorized the emergency use for the treatment of mild to moderate COVID-19 in adults and pediatric patients with positive results of direct SARS-CoV-2 viral testing who are 5 years of age and older weighing at least 40 kg, and who are at high risk for progressing to severe COVID-19 and/or hospitalization.  2. The significant known and potential risks and benefits of COVID monoclonal antibody, and the extent to which such potential risks and benefits are unknown.  3. Information on available alternative treatments and the risks and benefits of those alternatives, including clinical trials.  4. Patients treated with COVID monoclonal antibody should continue to self-isolate and use infection control measures (e.g., wear mask, isolate, social distance, avoid sharing personal items, clean and disinfect "high touch" surfaces, and frequent handwashing) according to CDC guidelines.   5. The patient or parent/caregiver has the option to accept or refuse COVID monoclonal antibody treatment.  After reviewing this information with the patient, the patient has agreed to receive one of  the available covid 19 monoclonal antibodies and will be provided an appropriate fact sheet prior to infusion. Tami Lin [name], [place] 02/20/2020 1:40 PM

## 2020-02-21 ENCOUNTER — Ambulatory Visit (HOSPITAL_COMMUNITY)
Admission: RE | Admit: 2020-02-21 | Discharge: 2020-02-21 | Disposition: A | Discharge status: Home / Self Care | Payer: No Typology Code available for payment source | Source: Ambulatory Visit | Attending: Pulmonary Disease | Admitting: Pulmonary Disease

## 2020-02-21 ENCOUNTER — Other Ambulatory Visit (HOSPITAL_COMMUNITY): Payer: Self-pay

## 2020-02-21 DIAGNOSIS — U071 COVID-19: Secondary | ICD-10-CM | POA: Diagnosis present

## 2020-02-21 DIAGNOSIS — E669 Obesity, unspecified: Secondary | ICD-10-CM | POA: Insufficient documentation

## 2020-02-21 MED ORDER — SOTROVIMAB 500 MG/8ML IV SOLN
500.0000 mg | Freq: Once | INTRAVENOUS | Status: AC
  Administered 2020-02-21: 500 mg via INTRAVENOUS

## 2020-02-21 MED ORDER — ALBUTEROL SULFATE HFA 108 (90 BASE) MCG/ACT IN AERS: 2.0000 | INHALATION_SPRAY | Freq: Once | RESPIRATORY_TRACT | Status: DC | PRN

## 2020-02-21 MED ORDER — FAMOTIDINE IN NACL 20-0.9 MG/50ML-% IV SOLN: 20.0000 mg | Freq: Once | INTRAVENOUS | Status: DC | PRN

## 2020-02-21 MED ORDER — METHYLPREDNISOLONE SODIUM SUCC 125 MG IJ SOLR: 125.0000 mg | Freq: Once | INTRAMUSCULAR | Status: DC | PRN

## 2020-02-21 MED ORDER — SODIUM CHLORIDE 0.9 % IV SOLN: INTRAVENOUS | Status: DC | PRN

## 2020-02-21 MED ORDER — DIPHENHYDRAMINE HCL 50 MG/ML IJ SOLN: 50.0000 mg | Freq: Once | INTRAMUSCULAR | Status: DC | PRN

## 2020-02-21 MED ORDER — EPINEPHRINE 0.3 MG/0.3ML IJ SOAJ: 0.3000 mg | Freq: Once | INTRAMUSCULAR | Status: DC | PRN

## 2020-02-21 NOTE — Progress Notes (Signed)
Patient reviewed Fact Sheet for Patients, Parents, and Caregivers for Emergency Use Authorization (EUA) of Sotrovimab for the Treatment of Coronavirus. Patient also reviewed and is agreeable to the estimated cost of treatment. Patient is agreeable to proceed.   

## 2020-02-21 NOTE — Discharge Instructions (Signed)
10 Things You Can Do to Manage Your COVID-19 Symptoms at Home If you have possible or confirmed COVID-19: 1. Stay home from work and school. And stay away from other public places. If you must go out, avoid using any kind of public transportation, ridesharing, or taxis. 2. Monitor your symptoms carefully. If your symptoms get worse, call your healthcare provider immediately. 3. Get rest and stay hydrated. 4. If you have a medical appointment, call the healthcare provider ahead of time and tell them that you have or may have COVID-19. 5. For medical emergencies, call 911 and notify the dispatch personnel that you have or may have COVID-19. 6. Cover your cough and sneezes with a tissue or use the inside of your elbow. 7. Wash your hands often with soap and water for at least 20 seconds or clean your hands with an alcohol-based hand sanitizer that contains at least 60% alcohol. 8. As much as possible, stay in a specific room and away from other people in your home. Also, you should use a separate bathroom, if available. If you need to be around other people in or outside of the home, wear a mask. 9. Avoid sharing personal items with other people in your household, like dishes, towels, and bedding. 10. Clean all surfaces that are touched often, like counters, tabletops, and doorknobs. Use household cleaning sprays or wipes according to the label instructions. cdc.gov/coronavirus 09/22/2018 This information is not intended to replace advice given to you by your health care provider. Make sure you discuss any questions you have with your health care provider. Document Revised: 02/24/2019 Document Reviewed: 02/24/2019 Elsevier Patient Education  2020 Elsevier Inc.  What types of side effects do monoclonal antibody drugs cause?  Common side effects  In general, the more common side effects caused by monoclonal antibody drugs include: . Allergic reactions, such as hives or itching . Flu-like signs and  symptoms, including chills, fatigue, fever, and muscle aches and pains . Nausea, vomiting . Diarrhea . Skin rashes . Low blood pressure   The CDC is recommending patients who receive monoclonal antibody treatments wait at least 90 days before being vaccinated.  Currently, there are no data on the safety and efficacy of mRNA COVID-19 vaccines in persons who received monoclonal antibodies or convalescent plasma as part of COVID-19 treatment. Based on the estimated half-life of such therapies as well as evidence suggesting that reinfection is uncommon in the 90 days after initial infection, vaccination should be deferred for at least 90 days, as a precautionary measure until additional information becomes available, to avoid interference of the antibody treatment with vaccine-induced immune responses.  If you have any questions or concerns after the infusion please call the Advanced Practice Provider on call at [phone]. This number is only intended for your use regarding questions or concerns about the infusion post-treatment side-effects.  Please do not provide this number to others for use.   If someone you know is interested in receiving treatment please have them call the COVID hotline at [phone].   

## 2020-02-21 NOTE — Progress Notes (Signed)
Diagnosis: COVID-19  Physician: Dr. Patrick Wright  Procedure: [facility] Med: Sotrovimab infusion - Provided patient with sotrovimab fact sheet for patients, parents, and caregivers prior to infusion.   Complications: No immediate complications noted  Discharge: Discharged home    

## 2020-03-27 ENCOUNTER — Ambulatory Visit: Payer: No Typology Code available for payment source | Admitting: Gastroenterology

## 2020-09-17 ENCOUNTER — Other Ambulatory Visit: Payer: Self-pay

## 2020-09-17 ENCOUNTER — Ambulatory Visit (INDEPENDENT_AMBULATORY_CARE_PROVIDER_SITE_OTHER): Payer: No Typology Code available for payment source | Admitting: Gastroenterology

## 2020-09-17 ENCOUNTER — Other Ambulatory Visit (INDEPENDENT_AMBULATORY_CARE_PROVIDER_SITE_OTHER): Payer: No Typology Code available for payment source

## 2020-09-17 ENCOUNTER — Encounter: Payer: Self-pay | Admitting: Gastroenterology

## 2020-09-17 VITALS — BP 130/86 | HR 100 | Ht 63.0 in | Wt 175.0 lb

## 2020-09-17 DIAGNOSIS — R194 Change in bowel habit: Secondary | ICD-10-CM

## 2020-09-17 DIAGNOSIS — R140 Abdominal distension (gaseous): Secondary | ICD-10-CM | POA: Diagnosis not present

## 2020-09-17 NOTE — Patient Instructions (Addendum)
If you are age 52 or older, your body mass index should be between 23-30. Your Body mass index is 31 kg/m. If this is out of the aforementioned range listed, please consider follow up with your Primary Care Provider.  If you are age 69 or younger, your body mass index should be between 19-25. Your Body mass index is 31 kg/m. If this is out of the aformentioned range listed, please consider follow up with your Primary Care Provider.   Please go to the 2nd floor of this building today and schedule your labwork, [place], [address].  Low FODMAP Diet: (Fermentable Oligosaccharides, Disaccharides, Monosaccharides, and Polyols) These are short chain carbohydrates and sugar alcohols that are poorly absorbed by the body, resulting in multiple abdominal symptoms, including changes in bowel habits, abdominal pain/discomfort, bloating, abdominal distension, gas, etc.      We are sending you home with a SIBO test kit. Please wait to hear from [facility] before you start to collect your specimen. If you do not hear from them within 2 weeks please contact our office at [phone]  Due to recent changes in healthcare laws, you may see the results of your imaging and laboratory studies on MyChart before your provider has had a chance to review them.  We understand that in some cases there may be results that are confusing or concerning to you. Not all laboratory results come back in the same time frame and the provider may be waiting for multiple results in order to interpret others.  Please give [place] 48 hours in order for your provider to thoroughly review all the results before contacting the office for clarification of your results.    Thank you for choosing me and [facility].  Vito Cirigliano, D.O.

## 2020-09-17 NOTE — Progress Notes (Signed)
Chief Complaint: Abdominal bloating, change in bowel habits   Referring Provider:     Donella Stade, PA-C   HPI:     Wendy Anderson is a 50 y.o. female referred to the Gastroenterology Clinic for evaluation of abdominal bloating and change in bowel habits.   Has had GI symptoms for the last 28+ years, described as abdominal bloating, visible distension, increased gas. Historically with constipation form childhood until about 10 years ago, then changed to loose stools (not diarrhea). No hematochezia. Also with chronic pain from waist down. Is concerned about "leaky gut".   Prior history of Vit D def and reports many years ago diagnosed by her previous PCM NP with pernicious anemia, treated with B12 injections.  None of those labs available for review.  Has trialed GFD and felt better with decreased GI symptoms, decreased pain, and improved energy.  Has breakthrough symptoms when she goes off her gluten-free diet.  Has done food sensivity testing with sensivity (not allergy) to wheat, caseine, eggs, and less so corn, caffeine, chocolate.   Has had colonoscopy x3 in her lifetime. No prior EGD.  Labs in 12/2019 notable for the following: - Normal CBC, CMP, ESR, CRP, vitamin D, B12, folate - ANA positive at 1:320 - Negative SCL-70  Right total hip at age 30  Endoscopic History: -Colonoscopy approximately 2014: normal per patient -Colonoscopy (09/2019, [facility]): Suboptimal prep per patient, with recommendation to repeat in 5 years   Past Medical History:  Diagnosis Date   Migraine    Prediabetes      Past Surgical History:  Procedure Laterality Date   bladder tack     BONE EXOSTOSIS EXCISION     removal from left femur(benign)   PARTIAL HYSTERECTOMY     removal of bone spurs     form 4th and 5th toes   TERATOMA EXCISION     bilateral   TOTAL HIP ARTHROPLASTY     right hip   UMBILICAL HERNIA REPAIR     Family History  Problem Relation Age of Onset    Lung cancer Mother        deceased at age 65   Neurologic Disorder Father        Primary Progressive Aphasia   Brain cancer Maternal Grandmother    Heart attack Maternal Grandfather    Diabetes Paternal Grandfather    Hyperlipidemia Paternal Grandfather    Hypertension Paternal Grandfather    Breast cancer Cousin        3 cousins from mother side   Stroke Neg Hx    Colon cancer Neg Hx    Pancreatic cancer Neg Hx    Liver disease Neg Hx    Stomach cancer Neg Hx    Social History   Tobacco Use   Smoking status: Never   Smokeless tobacco: Never  Vaping Use   Vaping Use: Never used  Substance Use Topics   Alcohol use: Yes    Comment: socially   Drug use: No   Current Outpatient Medications  Medication Sig Dispense Refill   cholecalciferol (VITAMIN D3) 25 MCG (1000 UNIT) tablet Take 5,000 Units by mouth daily.     MAGNESIUM PO Take 1 tablet by mouth daily.     MULTIPLE VITAMIN PO Take 2 tablets by mouth daily. gummies     TURMERIC PO Take by mouth daily.      No current facility-administered medications for this visit.  Allergies  Allergen Reactions   Ivp Dye [Iodinated Diagnostic Agents] Hives   Dolobid [Diflunisal]    Percocet [Oxycodone-Acetaminophen] Hives   Codeine Anxiety   Codeine Sulfate Anxiety     Review of Systems: All systems reviewed and negative except where noted in HPI.     Physical Exam:    Wt Readings from Last 3 Encounters:  09/17/20 175 lb (79.4 kg)  01/16/20 176 lb (79.8 kg)  09/02/19 170 lb (77.1 kg)    BP 130/86   Pulse 100   Ht _0  (1.6 m)   Wt 175 lb (79.4 kg)   SpO2 97%   BMI 31.00 kg/m  Constitutional:  Pleasant, in no acute distress. Psychiatric: Normal mood and affect. Behavior is normal. EENT: Pupils normal.  Conjunctivae are normal. No scleral icterus. Neck supple. No cervical LAD. Cardiovascular: Normal rate, regular rhythm. No edema Pulmonary/chest: Effort normal and breath sounds normal. No wheezing, rales or  rhonchi. Abdominal: Soft, nondistended, nontender. Bowel sounds active throughout. There are no masses palpable. No hepatomegaly. Neurological: Alert and oriented to person place and time. Skin: Skin is warm and dry. No rashes noted.   ASSESSMENT AND PLAN;   1) abdominal bloating 2) Abdominal distention 3) Change in bowel habits/Loose stools 4) History of vitamin D deficiency  -Breath testing to evaluate for SIBO/IMO -EGD to evaluate for mucosal/luminal pathology with gastric/duodenal biopsies - Recheck vitamin D as this was  normal in 12/2019 while taking supplements - Check iron panel to evaluate for comalabsorption - Colonoscopy completed in 09/2019.  We will try to obtain records from GI - If no clear etiology, could consider cross-sectional imaging  The indications, risks, and benefits of EGD were explained to the patient in detail. Risks include but are not limited to bleeding, perforation, adverse reaction to medications, and cardiopulmonary compromise. Sequelae include but are not limited to the possibility of surgery, hospitalization, and mortality. The patient verbalized understanding and wished to proceed. All questions answered, referred to scheduler. Further recommendations pending results of the exam.    Lavena Bullion, DO, FACG  09/17/2020, 2:41 PM   Breeback, Royetta Car, PA-C

## 2020-09-18 LAB — FERRITIN: Ferritin: 73.6 ng/mL (ref 10.0–291.0)

## 2020-09-18 LAB — IRON, TOTAL/TOTAL IRON BINDING CAP
%SAT: 20 % (calc) (ref 16–45)
Iron: 73 ug/dL (ref 45–160)
TIBC: 364 mcg/dL (calc) (ref 250–450)

## 2020-09-18 LAB — VITAMIN D 25 HYDROXY (VIT D DEFICIENCY, FRACTURES): VITD: 35.38 ng/mL (ref 30.00–100.00)

## 2020-09-21 ENCOUNTER — Ambulatory Visit (INDEPENDENT_AMBULATORY_CARE_PROVIDER_SITE_OTHER): Payer: No Typology Code available for payment source | Admitting: Physician Assistant

## 2020-09-21 ENCOUNTER — Ambulatory Visit (INDEPENDENT_AMBULATORY_CARE_PROVIDER_SITE_OTHER): Payer: No Typology Code available for payment source

## 2020-09-21 ENCOUNTER — Other Ambulatory Visit: Payer: Self-pay

## 2020-09-21 ENCOUNTER — Encounter: Payer: Self-pay | Admitting: Physician Assistant

## 2020-09-21 VITALS — BP 121/76 | HR 70 | Wt 171.0 lb

## 2020-09-21 DIAGNOSIS — M25552 Pain in left hip: Secondary | ICD-10-CM

## 2020-09-21 MED ORDER — KETOROLAC TROMETHAMINE 60 MG/2ML IM SOLN
60.0000 mg | Freq: Once | INTRAMUSCULAR | Status: AC
  Administered 2020-09-21: 10:00:00 60 mg via INTRAMUSCULAR

## 2020-09-21 NOTE — Progress Notes (Signed)
Subjective:    Patient ID: Wendy Anderson, female    DOB: 03-Oct-1970, 50 y.o.   MRN: [id number]  HPI Pt is a 50 yo obese female who presents to the clinic with left hip pain for 10 months and slowly worsening. She has a hx of DDD and osteoarthritis. She had her right hip replaced due to OA in 2012 by Dr. Al Corpus. She noticed it in September after moving to a stand up desk and on her feet for hours on a concrete floor. She denies any low back pain or radiation of pain into legs. Sitting and laying hurt the most. Generally just at night the pain is worse. She does notice some pain getting out of a car and with steps.  No injuries. No strength changes. Cannot take oral NSAIdS due to gastritis. Not taking anything else.   .. 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   Pre-diabetes 04/11/2013   Tendinitis of left rotator cuff 04/11/2013   Gluten intolerance 04/11/2013   Vitamin D insufficiency 04/12/2013   Hemorrhoids 04/12/2013   Insomnia 04/12/2013   Degenerative disc disease, cervical 04/12/2013   Hematuria 04/12/2013   Hyperlipidemia 11/11/2013   Essential hypertension 12/07/2014   Chest discomfort 03/28/2015   Right shoulder pain 07/20/2015   Stress at home 02/27/2016   Anxiety state 02/27/2016   Sebaceous hyperplasia 04/28/2016   Cervical herniated disc 12/02/2016   Suspicious nevus 05/20/2017   Neck fullness 05/24/2017   Right hip pain 06/09/2017   Thyroid cyst 06/18/2017   Ganglion cyst of foot 09/20/2018   Onychomycosis 09/20/2018   Class 1 obesity due to excess calories without serious comorbidity with body mass index (BMI) of 30.0 to 30.9 in adult 09/02/2019   Hyperglyceridemia 09/13/2019   Diverticulosis 10/24/2019   Left hip pain 01/30/2020   Myalgia 01/30/2020   Paresthesia 01/30/2020   Positive ANA (antinuclear antibody) 01/30/2020   Leg cramping 01/30/2020   URI (upper  respiratory infection) 02/15/2020   Resolved Ambulatory Problems    Diagnosis Date Noted   No Resolved Ambulatory Problems   Past Medical History:  Diagnosis Date   Migraine    Prediabetes     Review of Systems See HPI.     Objective:   Physical Exam Vitals reviewed.  Constitutional:      Appearance: Normal appearance. She is obese.  HENT:     Head: Normocephalic.  Cardiovascular:     Rate and Rhythm: Normal rate and regular rhythm.     Pulses: Normal pulses.     Heart sounds: Normal heart sounds.  Pulmonary:     Effort: Pulmonary effort is normal.     Breath sounds: Normal breath sounds.  Musculoskeletal:     Cervical back: Normal range of motion and neck supple.     Comments: Left hip:  No significant change in ROM. More discomfort with lateral movement.   Neurological:     General: No focal deficit present.     Mental Status: She is alert and oriented to person, place, and time.  Psychiatric:        Mood and Affect: Mood normal.          Assessment & Plan:  Marland KitchenMarland KitchenDackeri was seen today for neck pain and leg pain.  Diagnoses and all orders for this visit:  Left hip pain -     DG Hip Unilat W OR W/O Pelvis 2-3  Views Left; Future  Suspect OA of left hip.  Toradol given today for pain.  Tylenol for break through pain.  Discussed exercises to strengthen the hip.  Xrays ordered.

## 2020-09-21 NOTE — Addendum Note (Signed)
Addended by: Teddy Spike on: 09/21/2020 04:52 PM   Modules accepted: Orders

## 2020-09-21 NOTE — Patient Instructions (Signed)
Hip Pain The hip is the joint between the upper legs and the lower pelvis. The bones, cartilage, tendons, and muscles of your hip joint support your body and allow you to move around. Hip pain can range from a minor ache to severe pain in one or both of your hips. The pain may be felt on the inside of the hip joint near the groin, or on the outside near the buttocks and upper thigh. You may also have swelling or stiffness in your hip area. Follow these instructions at home: Managing pain, stiffness, and swelling   If directed, put ice on the painful area. To do this: Put ice in a plastic bag. Place a towel between your skin and the bag. Leave the ice on for 20 minutes, 2-3 times a day. If directed, apply heat to the affected area as often as told by your health care provider. Use the heat source that your health care provider recommends, such as a moist heat pack or a heating pad. Place a towel between your skin and the heat source. Leave the heat on for 20-30 minutes. Remove the heat if your skin turns bright red. This is especially important if you are unable to feel pain, heat, or cold. You may have a greater risk of getting burned. Activity Do exercises as told by your health care provider. Avoid activities that cause pain. General instructions  Take over-the-counter and prescription medicines only as told by your health care provider. Keep a journal of your symptoms. Write down: How often you have hip pain. The location of your pain. What the pain feels like. What makes the pain worse. Sleep with a pillow between your legs on your most comfortable side. Keep all follow-up visits as told by your health care provider. This is important. Contact a health care provider if: You cannot put weight on your leg. Your pain or swelling continues or gets worse after one week. It gets harder to walk. You have a fever. Get help right away if: You fall. You have a sudden increase in pain and  swelling in your hip. Your hip is red or swollen or very tender to touch. Summary Hip pain can range from a minor ache to severe pain in one or both of your hips. The pain may be felt on the inside of the hip joint near the groin, or on the outside near the buttocks and upper thigh. Avoid activities that cause pain. Write down how often you have hip pain, the location of the pain, what makes it worse, and what it feels like. This information is not intended to replace advice given to you by your health care provider. Make sure you discuss any questions you have with your health care provider. Document Revised: 07/26/2018 Document Reviewed: 07/26/2018 Elsevier Patient Education  2022 Elsevier Inc.  

## 2020-09-25 ENCOUNTER — Encounter: Payer: Self-pay | Admitting: Physician Assistant

## 2020-09-25 DIAGNOSIS — M1612 Unilateral primary osteoarthritis, left hip: Secondary | ICD-10-CM | POA: Insufficient documentation

## 2020-09-25 NOTE — Progress Notes (Signed)
Mild to moderate arthritis in left hip. I do think this is where your pain is coming from. Did you have any preference on which orthopedic group you went to?

## 2020-10-03 ENCOUNTER — Telehealth: Payer: Self-pay | Admitting: Gastroenterology

## 2020-10-03 NOTE — Telephone Encounter (Signed)
Hi Dr. Bryan Lemma, this patient just called to cancel procedure that was scheduled on Monday 10/08/20. She stated that you ordered another test so she prefers to wait for the results of that test before moving forward with EGD. Thank you.

## 2020-10-04 NOTE — Telephone Encounter (Signed)
Called and left voicemail for patient to call back and mychart message sent as well

## 2020-10-04 NOTE — Telephone Encounter (Addendum)
Patient has not done the breath test yet because she had to make unexpected trip to her mom and due to her having to do the prep when it comes to her diet for this she has not done it yet. But she did tell me that she has 2 test, one from us and one from the company now. She hopes to do the test on Monday   I called [facility] again and got Dave and they said the reason she has 2 kits now is that when they sent out the one on July 5th they sent the lactose sugar intolerance breathe kit to the patient. The one we keep on stock is the SIBO lactulose breath test so im trying to get back in contact with the patient to tell her not to do anything until we have confirmed the right order. So you want her to do the kit we have here right?  

## 2020-10-04 NOTE — Telephone Encounter (Signed)
LVM with [facility] and with patient.

## 2020-10-04 NOTE — Telephone Encounter (Signed)
Spoke with Catalina Antigua at [employer] and he said that on July 5th they sent out a lactose kit to the patient and she hasn't sent anything back to them. I have tried to call patient and left a voicemail for her to call us back again

## 2020-10-05 NOTE — Telephone Encounter (Signed)
Patient is aware through mychart regarding to use the kit that is from our office and to call [facility] regarding the kit that she received from them

## 2020-10-08 ENCOUNTER — Encounter: Payer: No Typology Code available for payment source | Admitting: Gastroenterology

## 2021-02-11 ENCOUNTER — Encounter: Payer: Self-pay | Admitting: Physician Assistant

## 2021-02-11 DIAGNOSIS — M25552 Pain in left hip: Secondary | ICD-10-CM

## 2021-02-11 NOTE — Telephone Encounter (Signed)
Pt was last seen for this problem in July, 2022.  Referral order pended for review.  Charyl Bigger, CMA

## 2021-02-22 ENCOUNTER — Encounter: Payer: Self-pay | Admitting: Physician Assistant

## 2021-02-22 ENCOUNTER — Ambulatory Visit (INDEPENDENT_AMBULATORY_CARE_PROVIDER_SITE_OTHER): Payer: No Typology Code available for payment source | Admitting: Physician Assistant

## 2021-02-22 ENCOUNTER — Other Ambulatory Visit: Payer: Self-pay

## 2021-02-22 VITALS — BP 142/91 | HR 84 | Temp 98.4°F | Ht 63.0 in | Wt 172.0 lb

## 2021-02-22 DIAGNOSIS — K5792 Diverticulitis of intestine, part unspecified, without perforation or abscess without bleeding: Secondary | ICD-10-CM

## 2021-02-22 DIAGNOSIS — Z23 Encounter for immunization: Secondary | ICD-10-CM

## 2021-02-22 DIAGNOSIS — M1612 Unilateral primary osteoarthritis, left hip: Secondary | ICD-10-CM

## 2021-02-22 DIAGNOSIS — R1032 Left lower quadrant pain: Secondary | ICD-10-CM

## 2021-02-22 DIAGNOSIS — Z1231 Encounter for screening mammogram for malignant neoplasm of breast: Secondary | ICD-10-CM

## 2021-02-22 DIAGNOSIS — Z09 Encounter for follow-up examination after completed treatment for conditions other than malignant neoplasm: Secondary | ICD-10-CM | POA: Diagnosis not present

## 2021-02-22 DIAGNOSIS — I10 Essential (primary) hypertension: Secondary | ICD-10-CM

## 2021-02-22 DIAGNOSIS — R7303 Prediabetes: Secondary | ICD-10-CM

## 2021-02-22 DIAGNOSIS — R7309 Other abnormal glucose: Secondary | ICD-10-CM

## 2021-02-22 DIAGNOSIS — K8689 Other specified diseases of pancreas: Secondary | ICD-10-CM

## 2021-02-22 LAB — POCT GLYCOSYLATED HEMOGLOBIN (HGB A1C): Hemoglobin A1C: 5.9 % — AB (ref 4.0–5.6)

## 2021-02-22 MED ORDER — IBUPROFEN 800 MG PO TABS: 800.0000 mg | ORAL_TABLET | Freq: Three times a day (TID) | ORAL | 0 refills | Status: DC | PRN

## 2021-02-22 MED ORDER — HYDROCHLOROTHIAZIDE 12.5 MG PO TABS: 12.5000 mg | ORAL_TABLET | Freq: Every day | ORAL | 0 refills | Status: DC

## 2021-02-22 NOTE — Patient Instructions (Addendum)
Will get repeat CT.  Diverticulitis Diverticulitis is infection or inflammation of small pouches (diverticula) in the colon that form due to a condition called diverticulosis. Diverticula can trap stool (feces) and bacteria, causing infection and inflammation. Diverticulitis may cause severe stomach pain and diarrhea. It may lead to tissue damage in the colon that causes bleeding or blockage. The diverticula may also burst (rupture) and cause infected stool to enter other areas of the abdomen. What are the causes? This condition is caused by stool becoming trapped in the diverticula, which allows bacteria to grow in the diverticula. This leads to inflammation and infection. What increases the risk? You are more likely to develop this condition if you have diverticulosis. The risk increases if you: Are overweight or obese. Do not get enough exercise. Drink alcohol. Use tobacco products. Eat a diet that has a lot of red meat such as beef, pork, or lamb. Eat a diet that does not include enough fiber. High-fiber foods include fruits, vegetables, beans, nuts, and whole grains. Are over 81 years of age. What are the signs or symptoms? Symptoms of this condition may include: Pain and tenderness in the abdomen. The pain is normally located on the left side of the abdomen, but it may occur in other areas. Fever and chills. Nausea. Vomiting. Cramping. Bloating. Changes in bowel routines. Blood in your stool. How is this diagnosed? This condition is diagnosed based on: Your medical history. A physical exam. Tests to make sure there is nothing else causing your condition. These tests may include: Blood tests. Urine tests. CT scan of the abdomen. How is this treated? Most cases of this condition are mild and can be treated at home. Treatment may include: Taking over-the-counter pain medicines. Following a clear liquid diet. Taking antibiotic medicines by mouth. Resting. More severe cases  may need to be treated at a hospital. Treatment may include: Not eating or drinking. Taking prescription pain medicine. Receiving antibiotic medicines through an IV. Receiving fluids and nutrition through an IV. Surgery. When your condition is under control, your health care provider may recommend that you have a colonoscopy. This is an exam to look at the entire large intestine. During the exam, a lubricated, bendable tube is inserted into the anus and then passed into the rectum, colon, and other parts of the large intestine. A colonoscopy can show how severe your diverticula are and whether something else may be causing your symptoms. Follow these instructions at home: Medicines Take over-the-counter and prescription medicines only as told by your health care provider. These include fiber supplements, probiotics, and stool softeners. If you were prescribed an antibiotic medicine, take it as told by your health care provider. Do not stop taking the antibiotic even if you start to feel better. Ask your health care provider if the medicine prescribed to you requires you to avoid driving or using machinery. Eating and drinking  Follow a full liquid diet or another diet as directed by your health care provider. After your symptoms improve, your health care provider may tell you to change your diet. He or she may recommend that you eat a diet that contains at least 25 grams (25 g) of fiber daily. Fiber makes it easier to pass stool. Healthy sources of fiber include: Berries. One cup contains 4-8 grams of fiber. Beans or lentils. One-half cup contains 5-8 grams of fiber. Green vegetables. One cup contains 4 grams of fiber. Avoid eating red meat. General instructions Do not use any products that contain nicotine  or tobacco, such as cigarettes, e-cigarettes, and chewing tobacco. If you need help quitting, ask your health care provider. Exercise for at least 30 minutes, 3 times each week. You should  exercise hard enough to raise your heart rate and break a sweat. Keep all follow-up visits as told by your health care provider. This is important. You may need to have a colonoscopy. Contact a health care provider if: Your pain does not improve. Your bowel movements do not return to normal. Get help right away if: Your pain gets worse. Your symptoms do not get better with treatment. Your symptoms suddenly get worse. You have a fever. You vomit more than one time. You have stools that are bloody, black, or tarry. Summary Diverticulitis is infection or inflammation of small pouches (diverticula) in the colon that form due to a condition called diverticulosis. Diverticula can trap stool (feces) and bacteria, causing infection and inflammation. You are at higher risk for this condition if you have diverticulosis and you eat a diet that does not include enough fiber. Most cases of this condition are mild and can be treated at home. More severe cases may need to be treated at a hospital. When your condition is under control, your health care provider may recommend that you have an exam called a colonoscopy. This exam can show how severe your diverticula are and whether something else may be causing your symptoms. Keep all follow-up visits as told by your health care provider. This is important. This information is not intended to replace advice given to you by your health care provider. Make sure you discuss any questions you have with your health care provider. Document Revised: 12/20/2018 Document Reviewed: 12/20/2018 Elsevier Patient Education  [place].   Tdap (Tetanus, Diphtheria, Pertussis) Vaccine: What You Need to Know 1. Why get vaccinated? Tdap vaccine can prevent tetanus, diphtheria, and pertussis. Diphtheria and pertussis spread from person to person. Tetanus enters the body through cuts or wounds. TETANUS (T) causes painful stiffening of the muscles. Tetanus can lead to  serious health problems, including being unable to open the mouth, having trouble swallowing and breathing, or death. DIPHTHERIA (D) can lead to difficulty breathing, heart failure, paralysis, or death. PERTUSSIS (aP), also known as "whooping cough," can cause uncontrollable, violent coughing that makes it hard to breathe, eat, or drink. Pertussis can be extremely serious especially in babies and young children, causing pneumonia, convulsions, brain damage, or death. In teens and adults, it can cause weight loss, loss of bladder control, passing out, and rib fractures from severe coughing. 2. Tdap vaccine Tdap is only for children 7 years and older, adolescents, and adults.  Adolescents should receive a single dose of Tdap, preferably at age 48 or 46 years. Pregnant people should get a dose of Tdap during every pregnancy, preferably during the early part of the third trimester, to help protect the newborn from pertussis. Infants are most at risk for severe, life-threatening complications from pertussis. Adults who have never received Tdap should get a dose of Tdap. Also, adults should receive a booster dose of either Tdap or Td (a different vaccine that protects against tetanus and diphtheria but not pertussis) every 10 years, or after 5 years in the case of a severe or dirty wound or burn. Tdap may be given at the same time as other vaccines. 3. Talk with your health care provider Tell your vaccine provider if the person getting the vaccine: Has had an allergic reaction after a previous dose  of any vaccine that protects against tetanus, diphtheria, or pertussis, or has any severe, life-threatening allergies Has had a coma, decreased level of consciousness, or prolonged seizures within 7 days after a previous dose of any pertussis vaccine (DTP, DTaP, or Tdap) Has seizures or another nervous system problem Has ever had Guillain-Barr Syndrome (also called "GBS") Has had severe pain or swelling after a  previous dose of any vaccine that protects against tetanus or diphtheria In some cases, your health care provider may decide to postpone Tdap vaccination until a future visit. People with minor illnesses, such as a cold, may be vaccinated. People who are moderately or severely ill should usually wait until they recover before getting Tdap vaccine.  Your health care provider can give you more information. 4. Risks of a vaccine reaction Pain, redness, or swelling where the shot was given, mild fever, headache, feeling tired, and nausea, vomiting, diarrhea, or stomachache sometimes happen after Tdap vaccination. People sometimes faint after medical procedures, including vaccination. Tell your provider if you feel dizzy or have vision changes or ringing in the ears.  As with any medicine, there is a very remote chance of a vaccine causing a severe allergic reaction, other serious injury, or death. 5. What if there is a serious problem? An allergic reaction could occur after the vaccinated person leaves the clinic. If you see signs of a severe allergic reaction (hives, swelling of the face and throat, difficulty breathing, a fast heartbeat, dizziness, or weakness), call 9-1-1 and get the person to the nearest hospital. For other signs that concern you, call your health care provider.  Adverse reactions should be reported to the Vaccine Adverse Event Reporting System (VAERS). Your health care provider will usually file this report, or you can do it yourself. Visit the VAERS website at www.vaers.SamedayNews.es or call 708 438 7153. VAERS is only for reporting reactions, and VAERS staff members do not give medical advice. 6. The National Vaccine Injury Compensation Program The [employer] Vaccine Injury Compensation Program (VICP) is a federal program that was created to compensate people who may have been injured by certain vaccines. Claims regarding alleged injury or death due to vaccination have a time limit for  filing, which may be as short as two years. Visit the VICP website at GoldCloset.com.ee or call (204)729-7729 to learn about the program and about filing a claim. 7. How can I learn more? Ask your health care provider. Call your local or state health department. Visit the website of the Food and Drug Administration (FDA) for vaccine package inserts and additional information at TraderRating.uy. Contact the Centers for Disease Control and Prevention (CDC): Call 573-042-1021 (1-800-CDC-INFO) or Visit CDC's website at http://hunter.com/. Vaccine Information Statement Tdap (Tetanus, Diphtheria, Pertussis) Vaccine (10/28/2019) This information is not intended to replace advice given to you by your health care provider. Make sure you discuss any questions you have with your health care provider. Document Revised: 11/23/2019 Document Reviewed: 11/23/2019 Elsevier Patient Education  [place].   Influenza (Flu) Vaccine (Inactivated or Recombinant): What You Need to Know 1. Why get vaccinated? Influenza vaccine can prevent influenza (flu). Flu is a contagious disease that spreads around the [place] every year, usually between October and May. Anyone can get the flu, but it is more dangerous for some people. Infants and young children, people 7 years and older, pregnant people, and people with certain health conditions or a weakened immune system are at greatest risk of flu complications. Pneumonia, bronchitis, sinus infections, and ear infections  are examples of flu-related complications. If you have a medical condition, such as heart disease, cancer, or diabetes, flu can make it worse. Flu can cause fever and chills, sore throat, muscle aches, fatigue, cough, headache, and runny or stuffy nose. Some people may have vomiting and diarrhea, though this is more common in children than adults. In an average year, thousands of people in the  [place] die from flu, and many more are hospitalized. Flu vaccine prevents millions of illnesses and flu-related visits to the doctor each year. 2. Influenza vaccines CDC recommends everyone 6 months and older get vaccinated every flu season. Children 6 months through 88 years of age may need 2 doses during a single flu season. Everyone else needs only 1 dose each flu season. It takes about 2 weeks for protection to develop after vaccination. There are many flu viruses, and they are always changing. Each year a new flu vaccine is made to protect against the influenza viruses believed to be likely to cause disease in the upcoming flu season. Even when the vaccine doesn't exactly match these viruses, it may still provide some protection. Influenza vaccine does not cause flu. Influenza vaccine may be given at the same time as other vaccines. 3. Talk with your health care provider Tell your vaccination provider if the person getting the vaccine: Has had an allergic reaction after a previous dose of influenza vaccine, or has any severe, life-threatening allergies Has ever had Guillain-Barr Syndrome (also called "GBS") In some cases, your health care provider may decide to postpone influenza vaccination until a future visit. Influenza vaccine can be administered at any time during pregnancy. People who are or will be pregnant during influenza season should receive inactivated influenza vaccine. People with minor illnesses, such as a cold, may be vaccinated. People who are moderately or severely ill should usually wait until they recover before getting influenza vaccine. Your health care provider can give you more information. 4. Risks of a vaccine reaction Soreness, redness, and swelling where the shot is given, fever, muscle aches, and headache can happen after influenza vaccination. There may be a very small increased risk of Guillain-Barr Syndrome (GBS) after inactivated influenza vaccine (the  flu shot). Young children who get the flu shot along with pneumococcal vaccine (PCV13) and/or DTaP vaccine at the same time might be slightly more likely to have a seizure caused by fever. Tell your health care provider if a child who is getting flu vaccine has ever had a seizure. People sometimes faint after medical procedures, including vaccination. Tell your provider if you feel dizzy or have vision changes or ringing in the ears. As with any medicine, there is a very remote chance of a vaccine causing a severe allergic reaction, other serious injury, or death. 5. What if there is a serious problem? An allergic reaction could occur after the vaccinated person leaves the clinic. If you see signs of a severe allergic reaction (hives, swelling of the face and throat, difficulty breathing, a fast heartbeat, dizziness, or weakness), call 9-1-1 and get the person to the nearest hospital. For other signs that concern you, call your health care provider. Adverse reactions should be reported to the Vaccine Adverse Event Reporting System (VAERS). Your health care provider will usually file this report, or you can do it yourself. Visit the VAERS website at www.vaers.SamedayNews.es or call 772-377-6357. VAERS is only for reporting reactions, and VAERS staff members do not give medical advice. 6. The National Vaccine Injury Fiserv The  National Sport and exercise psychologist (VICP) is a [employer] that was created to compensate people who may have been injured by certain vaccines. Claims regarding alleged injury or death due to vaccination have a time limit for filing, which may be as short as two years. Visit the VICP website at GoldCloset.com.ee or call (601) 627-5114 to learn about the program and about filing a claim. 7. How can I learn more? Ask your health care provider. Call your local or state health department. Visit the website of the Food and Drug Administration (FDA)  for vaccine package inserts and additional information at TraderRating.uy. Contact the Centers for Disease Control and Prevention (CDC): Call 7728635606 (1-800-CDC-INFO) or Visit CDC's website at https://gibson.com/. Vaccine Information Statement Inactivated Influenza Vaccine (10/28/2019) This information is not intended to replace advice given to you by your health care provider. Make sure you discuss any questions you have with your health care provider. Document Revised: 11/29/2020 Document Reviewed: 11/29/2020 Elsevier Patient Education  2022 [employer].

## 2021-02-22 NOTE — Progress Notes (Signed)
Subjective:    Patient ID: Wendy Anderson, female    DOB: 03-02-1971, 50 y.o.   MRN: [id number]  HPI Pt is a 50 yo female with PMHx of HTN and HLD who presents to the clinic for hospital follow up after ED visit on 12/29/2020 for left lower quadrant pain. Pt has given augmentin and metronidazole but did not take but already having so many gut problems did not want more. Pain resolved but now coming back some.  Last colonoscopy 09/22/2019 with diverticulosis found and next colonoscopy in 2026.   IMPRESSION: 1. Colonic diverticulosis with distal descending/proximal sigmoid colon acute diverticulitis. A microperforation is not fully excluded. Recommend colonoscopy status post treatment and status post complete resolution of inflammatory changes to exclude an underlying lesion. 2. Hazy pancreatic contour likely due to atrophy; however, mild acute pancreatitis is not excluded. Consider correlation with lipase levels if clinically indicated.  Lipase was low.   Concerned about hazy pancreas and atrophy.   Left hip pain continues. Has known OA. Has appt with ortho in January.   .. 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   Prediabetes 04/11/2013   Tendinitis of left rotator cuff 04/11/2013   Gluten intolerance 04/11/2013   Vitamin D insufficiency 04/12/2013   Hemorrhoids 04/12/2013   Insomnia 04/12/2013   Degenerative disc disease, cervical 04/12/2013   Hematuria 04/12/2013   Hyperlipidemia 11/11/2013   Essential hypertension 12/07/2014   Chest discomfort 03/28/2015   Right shoulder pain 07/20/2015   Stress at home 02/27/2016   Anxiety state 02/27/2016   Sebaceous hyperplasia 04/28/2016   Cervical herniated disc 12/02/2016   Suspicious nevus 05/20/2017   Neck fullness 05/24/2017   Right hip pain 06/09/2017   Thyroid cyst 06/18/2017   Ganglion cyst of foot 09/20/2018   Onychomycosis  09/20/2018   Class 1 obesity due to excess calories without serious comorbidity with body mass index (BMI) of 30.0 to 30.9 in adult 09/02/2019   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  Objective:   Physical Exam Vitals reviewed.  Constitutional:      Appearance: Normal appearance.  HENT:     Head: Normocephalic.  Neck:     Vascular: No carotid bruit.  Cardiovascular:     Rate and Rhythm: Normal rate and regular rhythm.     Pulses: Normal pulses.     Heart sounds: Normal heart sounds.  Pulmonary:     Effort: Pulmonary effort is normal.     Breath sounds: Normal breath sounds.  Musculoskeletal:     Right lower leg: No edema.     Left lower leg: No edema.  Neurological:     General: No focal deficit present.     Mental Status: She is alert and oriented to person, place, and time.  Psychiatric:        Mood and Affect: Mood normal.         .Marland Kitchen  Results for orders placed or performed in visit on 02/22/21  POCT glycosylated hemoglobin (Hb A1C)  Result Value Ref Range   Hemoglobin A1C 5.9 (A) 4.0 - 5.6 %   HbA1c POC (<> result, manual entry)     HbA1c, POC (prediabetic range)     HbA1c, POC (controlled diabetic range)      Assessment & Plan:  Marland KitchenMarland KitchenDackeri was seen today for diverticulitis and follow-up.  Diagnoses and all orders for this visit:  Hospital discharge follow-up  Diverticulitis  Encounter for screening mammogram for malignant neoplasm of  breast -     MM DIGITAL SCREENING BILATERAL; Future  Need for influenza vaccination -     Flu Vaccine QUAD 22mo+IM (Fluarix, Fluzone & Alfiuria Quad PF)  Need for Tdap vaccination -     Tdap vaccine greater than or equal to 7yo IM  Left lower quadrant abdominal pain  Abnormal glucose -     POCT glycosylated hemoglobin (Hb A1C)  Prediabetes  Essential hypertension -     hydrochlorothiazide (HYDRODIURIL) 12.5 MG tablet; Take 1 tablet (12.5 mg total) by mouth daily.  Localized osteoarthrosis of left hip -     ibuprofen (ADVIL) 800 MG tablet; Take 1 tablet (800 mg total) by mouth every 8 (eight) hours as needed.  Diverticulosis/diverticulitis.  Never took abx.  Having more LLQ pain.  Ordered CT with contrast and given prednisone as prophylaxis.  Discussed diet changes to prevent.  Follow up with GI.   A1C pre-diabetes. Discussed diet/exercise/weight loss.  Follow up in 6 months for recheck.  Flu/tdap given today.   Mammogram ordered.   Marland Kitchen.Discussed low carb diet with 1500 calories and 80g of protein.  Exercising at least 150 minutes a week.  [employer] could be a Microbiologist.    Left hip pain has ortho appt. Continue NSAIDS.

## 2021-02-25 ENCOUNTER — Encounter: Payer: Self-pay | Admitting: Physician Assistant

## 2021-02-25 DIAGNOSIS — K8689 Other specified diseases of pancreas: Secondary | ICD-10-CM | POA: Insufficient documentation

## 2021-02-25 MED ORDER — PREDNISONE 50 MG PO TABS: ORAL_TABLET | ORAL | 0 refills | Status: DC

## 2021-02-27 ENCOUNTER — Ambulatory Visit (INDEPENDENT_AMBULATORY_CARE_PROVIDER_SITE_OTHER): Payer: No Typology Code available for payment source

## 2021-02-27 ENCOUNTER — Other Ambulatory Visit: Payer: Self-pay

## 2021-02-27 DIAGNOSIS — Z1231 Encounter for screening mammogram for malignant neoplasm of breast: Secondary | ICD-10-CM

## 2021-02-28 NOTE — Progress Notes (Signed)
Normal mammogram. Follow up in one year.

## 2021-03-01 ENCOUNTER — Telehealth: Payer: Self-pay | Admitting: Neurology

## 2021-03-01 DIAGNOSIS — I10 Essential (primary) hypertension: Secondary | ICD-10-CM

## 2021-03-01 NOTE — Telephone Encounter (Signed)
Received message that patient needs creatinine before CT due to HTN. Patient made aware. Lab ordered.

## 2021-03-08 ENCOUNTER — Other Ambulatory Visit: Payer: No Typology Code available for payment source

## 2021-06-11 ENCOUNTER — Ambulatory Visit (INDEPENDENT_AMBULATORY_CARE_PROVIDER_SITE_OTHER): Payer: No Typology Code available for payment source | Admitting: Physician Assistant

## 2021-06-11 ENCOUNTER — Encounter: Payer: Self-pay | Admitting: Physician Assistant

## 2021-06-11 ENCOUNTER — Other Ambulatory Visit: Payer: Self-pay

## 2021-06-11 VITALS — BP 137/85 | HR 75 | Ht 63.0 in | Wt 172.0 lb

## 2021-06-11 DIAGNOSIS — Z0000 Encounter for general adult medical examination without abnormal findings: Secondary | ICD-10-CM

## 2021-06-11 DIAGNOSIS — R6882 Decreased libido: Secondary | ICD-10-CM | POA: Diagnosis not present

## 2021-06-11 DIAGNOSIS — R0989 Other specified symptoms and signs involving the circulatory and respiratory systems: Secondary | ICD-10-CM

## 2021-06-11 DIAGNOSIS — R7303 Prediabetes: Secondary | ICD-10-CM

## 2021-06-11 DIAGNOSIS — Z87898 Personal history of other specified conditions: Secondary | ICD-10-CM

## 2021-06-11 DIAGNOSIS — Z131 Encounter for screening for diabetes mellitus: Secondary | ICD-10-CM

## 2021-06-11 DIAGNOSIS — K5790 Diverticulosis of intestine, part unspecified, without perforation or abscess without bleeding: Secondary | ICD-10-CM

## 2021-06-11 DIAGNOSIS — E782 Mixed hyperlipidemia: Secondary | ICD-10-CM

## 2021-06-11 DIAGNOSIS — I10 Essential (primary) hypertension: Secondary | ICD-10-CM

## 2021-06-11 DIAGNOSIS — K8689 Other specified diseases of pancreas: Secondary | ICD-10-CM

## 2021-06-11 DIAGNOSIS — R1032 Left lower quadrant pain: Secondary | ICD-10-CM

## 2021-06-11 DIAGNOSIS — R252 Cramp and spasm: Secondary | ICD-10-CM

## 2021-06-11 LAB — POCT URINALYSIS DIP (CLINITEK)
Bilirubin, UA: NEGATIVE
Glucose, UA: NEGATIVE mg/dL
Ketones, POC UA: NEGATIVE mg/dL
Leukocytes, UA: NEGATIVE
Nitrite, UA: NEGATIVE
POC PROTEIN,UA: NEGATIVE
Spec Grav, UA: 1.01 (ref 1.010–1.025)
Urobilinogen, UA: 0.2 E.U./dL
pH, UA: 5.5 (ref 5.0–8.0)

## 2021-06-11 MED ORDER — HYDROCHLOROTHIAZIDE 12.5 MG PO TABS: 25.0000 mg | ORAL_TABLET | Freq: Every day | ORAL | 1 refills | Status: DC | PRN

## 2021-06-11 NOTE — Progress Notes (Signed)
Subjective:  ?  ? Wendy Anderson is a 51 y.o. female and is here for a comprehensive physical exam. The patient reports  see problem . ? ?She is concerned about her aging body and health. Her brother has renal cancer and wants urine checked. She is having bilateral leg cramps recently off and on and seems to be worse with walking.  ? ?She never got CT of abdomen done because of cost. She would like it reordered.  ? ? ?Social History  ? ?Socioeconomic History  ? Marital status: Married  ?  Spouse name: Not on file  ? Number of children: 4  ? Years of education: Not on file  ? Highest education level: Not on file  ?Occupational History  ? Not on file  ?Tobacco Use  ? Smoking status: Never  ? Smokeless tobacco: Never  ?Vaping Use  ? Vaping Use: Never used  ?Substance and Sexual Activity  ? Alcohol use: Yes  ?  Comment: socially  ? Drug use: No  ? Sexual activity: Yes  ?  Partners: Male  ?Other Topics Concern  ? Not on file  ?Social History Narrative  ? Not on file  ? ?Social Determinants of Health  ? ?Financial Resource Strain: Not on file  ?Food Insecurity: Not on file  ?Transportation Needs: Not on file  ?Physical Activity: Not on file  ?Stress: Not on file  ?Social Connections: Not on file  ?Intimate Partner Violence: Not on file  ? ?Health Maintenance  ?Topic Date Due  ? COVID-19 Vaccine (2 - Booster for Janssen series) 06/27/2021 (Originally 01/16/2020)  ? Zoster Vaccines- Shingrix (1 of 2) 09/11/2021 (Originally 05/03/2018)  ? MAMMOGRAM  02/27/2022  ? COLONOSCOPY (Pts 45-6yr Insurance coverage will need to be confirmed)  10/12/2024  ? TETANUS/TDAP  02/23/2031  ? Hepatitis C Screening  Completed  ? HIV Screening  Completed  ? HPV VACCINES  Aged Out  ? INFLUENZA VACCINE  Discontinued  ? ? ?The following portions of the patient's history were reviewed and updated as appropriate: allergies, current medications, past family history, past medical history, past social history, past surgical history, and problem  list. ? ?Review of Systems ?Pertinent items noted in HPI and remainder of comprehensive ROS otherwise negative.  ? ?Objective:  ? ? BP 137/85   Pulse 75   Ht '5\' 3"'$  (1.6 m)   Wt 172 lb (78 kg)   SpO2 100%   BMI 30.47 kg/m?  ?General appearance: alert, cooperative, appears stated age, and mildly obese ?Head: Normocephalic, without obvious abnormality, atraumatic ?Eyes: conjunctivae/corneas clear. PERRL, EOM's intact. Fundi benign. ?Ears: normal TM's and external ear canals both ears ?Nose: Nares normal. Septum midline. Mucosa normal. No drainage or sinus tenderness. ?Throat: lips, mucosa, and tongue normal; teeth and gums normal ?Neck: no adenopathy, no carotid bruit, no JVD, supple, symmetrical, trachea midline, and thyroid not enlarged, symmetric, no tenderness/mass/nodules ?Back: symmetric, no curvature. ROM normal. No CVA tenderness. ?Lungs: clear to auscultation bilaterally ?Heart: regular rate and rhythm, S1, S2 normal, no murmur, click, rub or gallop ?Abdomen: soft, non-tender; bowel sounds normal; no masses,  no organomegaly ?Extremities: extremities normal, atraumatic, no cyanosis or edema ?Pulses: diminished pedal pulses. ?Skin: Skin color, texture, turgor normal. No rashes or lesions ?Lymph nodes: Cervical, supraclavicular, and axillary nodes normal. ?Neurologic: Grossly normal  ? .. ? ?  06/11/2021  ?  9:57 AM 01/16/2020  ?  9:22 AM 01/05/2019  ?  8:41 AM 05/20/2017  ?  8:43 AM 12/02/2016  ?  8:24 AM  ?Depression screen PHQ 2/9  ?Decreased Interest 0 0 0 0 0  ?Down, Depressed, Hopeless 0 0 0 0 0  ?PHQ - 2 Score 0 0 0 0 0  ?Altered sleeping   1    ?Tired, decreased energy   1    ?Change in appetite   0    ?Feeling bad or failure about yourself    0    ?Trouble concentrating   0    ?Moving slowly or fidgety/restless   0    ?Suicidal thoughts   0    ?PHQ-9 Score   2    ?Difficult doing work/chores   Not difficult at all    ? ?.. ? ?  01/05/2019  ?  8:41 AM  ?GAD 7 : Generalized Anxiety Score  ?Nervous,  Anxious, on Edge 0  ?Control/stop worrying 0  ?Worry too much - different things 0  ?Trouble relaxing 0  ?Restless 0  ?Easily annoyed or irritable 0  ?Afraid - awful might happen 0  ?Total GAD 7 Score 0  ?Anxiety Difficulty Not difficult at all  ? ? ?Assessment:  ? ? Healthy female exam.   ?  ?Plan:  ? ? Marland KitchenMarland KitchenDackeri was seen today for annual exam. ? ?Diagnoses and all orders for this visit: ? ?Routine physical examination ?-     TSH ?-     B12 and Folate Panel ?-     VITAMIN D 25 Hydroxy (Vit-D Deficiency, Fractures) ?-     CBC with Differential/Platelet ?-     Estradiol ?-     FSH/LH ?-     Progesterone ?-     Hemoglobin A1c ? ?Essential hypertension ?-     COMPLETE METABOLIC PANEL WITH GFR ?-     hydrochlorothiazide (HYDRODIURIL) 12.5 MG tablet; Take 2 tablets (25 mg total) by mouth daily as needed. ? ?Mixed hyperlipidemia ?-     Lipid Panel w/reflex Direct LDL ? ?Low libido ?-     TSH ?-     B12 and Folate Panel ?-     VITAMIN D 25 Hydroxy (Vit-D Deficiency, Fractures) ?-     CBC with Differential/Platelet ?-     Estradiol ?-     FSH/LH ?-     Progesterone ? ?Screening for diabetes mellitus ?-     Hemoglobin A1c ? ?Prediabetes ?-     Hemoglobin A1c ?-     Hemoglobin A1c ? ?History of gross hematuria ?-     POCT URINALYSIS DIP (CLINITEK) ? ?.. ?Discussed 150 minutes of exercise a week.  ?Encouraged vitamin D 1000 units and Calcium '1300mg'$  or 4 servings of dairy a day.  ?Fasting labs ordered. ?PHQ no concerns.  ?UA small blood. Recheck in 2 weeks.  ?Mammogram and Pap UTD.  ?Colonoscopy UTD.  ?Declined shingrix ?Declined covid vaccine. ?Declined flu vaccine.  ?ABI ordered for leg cramping and diminshed pulses.  ?CT reordered for atrophy or pancreas/diverticulosis/LLQ pain. ? ?See After Visit Summary for Counseling Recommendations  ? ?

## 2021-06-11 NOTE — Patient Instructions (Addendum)
Will get ABI.  ? ?Health Maintenance, Female ?Adopting a healthy lifestyle and getting preventive care are important in promoting health and wellness. Ask your health care provider about: ?The right schedule for you to have regular tests and exams. ?Things you can do on your own to prevent diseases and keep yourself healthy. ?What should I know about diet, weight, and exercise? ?Eat a healthy diet ? ?Eat a diet that includes plenty of vegetables, fruits, low-fat dairy products, and lean protein. ?Do not eat a lot of foods that are high in solid fats, added sugars, or sodium. ?Maintain a healthy weight ?Body mass index (BMI) is used to identify weight problems. It estimates body fat based on height and weight. Your health care provider can help determine your BMI and help you achieve or maintain a healthy weight. ?Get regular exercise ?Get regular exercise. This is one of the most important things you can do for your health. Most adults should: ?Exercise for at least 150 minutes each week. The exercise should increase your heart rate and make you sweat (moderate-intensity exercise). ?Do strengthening exercises at least twice a week. This is in addition to the moderate-intensity exercise. ?Spend less time sitting. Even light physical activity can be beneficial. ?Watch cholesterol and blood lipids ?Have your blood tested for lipids and cholesterol at 53 years of age, then have this test every 5 years. ?Have your cholesterol levels checked more often if: ?Your lipid or cholesterol levels are high. ?You are older than 53 years of age. ?You are at high risk for heart disease. ?What should I know about cancer screening? ?Depending on your health history and family history, you may need to have cancer screening at various ages. This may include screening for: ?Breast cancer. ?Cervical cancer. ?Colorectal cancer. ?Skin cancer. ?Lung cancer. ?What should I know about heart disease, diabetes, and high blood pressure? ?Blood  pressure and heart disease ?High blood pressure causes heart disease and increases the risk of stroke. This is more likely to develop in people who have high blood pressure readings or are overweight. ?Have your blood pressure checked: ?Every 3-5 years if you are 50-103 years of age. ?Every year if you are 13 years old or older. ?Diabetes ?Have regular diabetes screenings. This checks your fasting blood sugar level. Have the screening done: ?Once every three years after age 70 if you are at a normal weight and have a low risk for diabetes. ?More often and at a younger age if you are overweight or have a high risk for diabetes. ?What should I know about preventing infection? ?Hepatitis B ?If you have a higher risk for hepatitis B, you should be screened for this virus. Talk with your health care provider to find out if you are at risk for hepatitis B infection. ?Hepatitis C ?Testing is recommended for: ?Everyone born from [birth year] through 1967. ?Anyone with known risk factors for hepatitis C. ?Sexually transmitted infections (STIs) ?Get screened for STIs, including gonorrhea and chlamydia, if: ?You are sexually active and are younger than 53 years of age. ?You are older than 52 years of age and your health care provider tells you that you are at risk for this type of infection. ?Your sexual activity has changed since you were last screened, and you are at increased risk for chlamydia or gonorrhea. Ask your health care provider if you are at risk. ?Ask your health care provider about whether you are at high risk for HIV. Your health care provider may recommend a prescription  medicine to help prevent HIV infection. If you choose to take medicine to prevent HIV, you should first get tested for HIV. You should then be tested every 3 months for as long as you are taking the medicine. ?Pregnancy ?If you are about to stop having your period (premenopausal) and you may become pregnant, seek counseling before you get  pregnant. ?Take 400 to 800 micrograms (mcg) of folic acid every day if you become pregnant. ?Ask for birth control (contraception) if you want to prevent pregnancy. ?Osteoporosis and menopause ?Osteoporosis is a disease in which the bones lose minerals and strength with aging. This can result in bone fractures. If you are 73 years old or older, or if you are at risk for osteoporosis and fractures, ask your health care provider if you should: ?Be screened for bone loss. ?Take a calcium or vitamin D supplement to lower your risk of fractures. ?Be given hormone replacement therapy (HRT) to treat symptoms of menopause. ?Follow these instructions at home: ?Alcohol use ?Do not drink alcohol if: ?Your health care provider tells you not to drink. ?You are pregnant, may be pregnant, or are planning to become pregnant. ?If you drink alcohol: ?Limit how much you have to: ?0-1 drink a day. ?Know how much alcohol is in your drink. In the [place] one drink equals one 12 oz bottle of beer (355 mL), one 5 oz glass of wine (148 mL), or one 1? oz glass of hard liquor (44 mL). ?Lifestyle ?Do not use any products that contain nicotine or tobacco. These products include cigarettes, chewing tobacco, and vaping devices, such as e-cigarettes. If you need help quitting, ask your health care provider. ?Do not use street drugs. ?Do not share needles. ?Ask your health care provider for help if you need support or information about quitting drugs. ?General instructions ?Schedule regular health, dental, and eye exams. ?Stay current with your vaccines. ?Tell your health care provider if: ?You often feel depressed. ?You have ever been abused or do not feel safe at home. ?Summary ?Adopting a healthy lifestyle and getting preventive care are important in promoting health and wellness. ?Follow your health care provider's instructions about healthy diet, exercising, and getting tested or screened for diseases. ?Follow your health care provider's  instructions on monitoring your cholesterol and blood pressure. ?This information is not intended to replace advice given to you by your health care provider. Make sure you discuss any questions you have with your health care provider. ?Document Revised: 07/30/2020 Document Reviewed: 07/30/2020 ?Elsevier Patient Education ? [place]. ? ?

## 2021-06-12 ENCOUNTER — Other Ambulatory Visit: Payer: Self-pay | Admitting: Neurology

## 2021-06-12 ENCOUNTER — Encounter: Payer: Self-pay | Admitting: Neurology

## 2021-06-12 LAB — CBC WITH DIFFERENTIAL/PLATELET
Absolute Monocytes: 339 cells/uL (ref 200–950)
Basophils Absolute: 9 cells/uL (ref 0–200)
Basophils Relative: 0.2 %
Eosinophils Absolute: 70 cells/uL (ref 15–500)
Eosinophils Relative: 1.6 %
HCT: 38.9 % (ref 35.0–45.0)
Hemoglobin: 12.7 g/dL (ref 11.7–15.5)
Lymphs Abs: 1588 cells/uL (ref 850–3900)
MCH: 28.3 pg (ref 27.0–33.0)
MCHC: 32.6 g/dL (ref 32.0–36.0)
MCV: 86.8 fL (ref 80.0–100.0)
MPV: 10.2 fL (ref 7.5–12.5)
Monocytes Relative: 7.7 %
Neutro Abs: 2394 cells/uL (ref 1500–7800)
Neutrophils Relative %: 54.4 %
Platelets: 268 10*3/uL (ref 140–400)
RBC: 4.48 10*6/uL (ref 3.80–5.10)
RDW: 13.1 % (ref 11.0–15.0)
Total Lymphocyte: 36.1 %
WBC: 4.4 10*3/uL (ref 3.8–10.8)

## 2021-06-12 LAB — COMPLETE METABOLIC PANEL WITH GFR
AG Ratio: 1.6 (calc) (ref 1.0–2.5)
ALT: 11 U/L (ref 6–29)
AST: 14 U/L (ref 10–35)
Albumin: 4.5 g/dL (ref 3.6–5.1)
Alkaline phosphatase (APISO): 61 U/L (ref 37–153)
BUN: 16 mg/dL (ref 7–25)
CO2: 29 mmol/L (ref 20–32)
Calcium: 9.9 mg/dL (ref 8.6–10.4)
Chloride: 104 mmol/L (ref 98–110)
Creat: 0.77 mg/dL (ref 0.50–1.03)
Globulin: 2.8 g/dL (calc) (ref 1.9–3.7)
Glucose, Bld: 100 mg/dL — ABNORMAL HIGH (ref 65–99)
Potassium: 4.9 mmol/L (ref 3.5–5.3)
Sodium: 139 mmol/L (ref 135–146)
Total Bilirubin: 0.3 mg/dL (ref 0.2–1.2)
Total Protein: 7.3 g/dL (ref 6.1–8.1)
eGFR: 92 mL/min/{1.73_m2} (ref >=60)

## 2021-06-12 LAB — LIPID PANEL W/REFLEX DIRECT LDL
Cholesterol: 247 mg/dL — ABNORMAL HIGH (ref <200)
HDL: 57 mg/dL (ref >=50)
LDL Cholesterol (Calc): 160 mg/dL (calc) — ABNORMAL HIGH
Non-HDL Cholesterol (Calc): 190 mg/dL (calc) — ABNORMAL HIGH (ref <130)
Total CHOL/HDL Ratio: 4.3 (calc) (ref <5.0)
Triglycerides: 153 mg/dL — ABNORMAL HIGH (ref <150)

## 2021-06-12 LAB — FSH/LH
FSH: 114.7 m[IU]/mL
LH: 64.8 m[IU]/mL

## 2021-06-12 LAB — B12 AND FOLATE PANEL
Folate: 18.4 ng/mL
Vitamin B-12: 514 pg/mL (ref 200–1100)

## 2021-06-12 LAB — TSH: TSH: 1.99 mIU/L

## 2021-06-12 LAB — ESTRADIOL: Estradiol: <15 pg/mL

## 2021-06-12 LAB — HEMOGLOBIN A1C
Hgb A1c MFr Bld: 6.1 % of total Hgb — ABNORMAL HIGH (ref <5.7)
Mean Plasma Glucose: 128 mg/dL
eAG (mmol/L): 7.1 mmol/L

## 2021-06-12 LAB — VITAMIN D 25 HYDROXY (VIT D DEFICIENCY, FRACTURES): Vit D, 25-Hydroxy: 30 ng/mL (ref 30–100)

## 2021-06-12 LAB — PROGESTERONE: Progesterone: <0.5 ng/mL

## 2021-06-12 NOTE — Progress Notes (Signed)
Tried to call patient to let her know form is ready for pick up. No answer, vm full. Mychart message sent to patient.  ? ?

## 2021-06-12 NOTE — Telephone Encounter (Signed)
Tried to call patient to let her know form is ready for pick up. No answer, vm full. Mychart message sent to patient.  ?

## 2021-06-12 NOTE — Progress Notes (Signed)
DeDe,  ? ?You are post-menopausal. Estradiol very little. Progesterone very little.  ?You could definitely consider a conversation about hormones.  ?Hemoglobin looks good.  ?Vitamin D dropped a lot from a year ago. Make sure you are taking daily.  ?B12 looks great! ?Kidney, liver, glucose looks good.  ?Thyroid looks great.  ?A1C is up but still in pre-diabetic range. We could start metformin to help with insulin resistance and could help with weight.  ?LDL is not to goal and not going down.

## 2021-06-14 ENCOUNTER — Encounter: Payer: Self-pay | Admitting: Physician Assistant

## 2021-07-02 ENCOUNTER — Ambulatory Visit (HOSPITAL_COMMUNITY)
Admission: RE | Admit: 2021-07-02 | Discharge: 2021-07-02 | Disposition: A | Discharge status: Home / Self Care | Payer: No Typology Code available for payment source | Source: Ambulatory Visit | Attending: Physician Assistant | Admitting: Physician Assistant

## 2021-07-02 DIAGNOSIS — R0989 Other specified symptoms and signs involving the circulatory and respiratory systems: Secondary | ICD-10-CM | POA: Insufficient documentation

## 2021-07-02 DIAGNOSIS — R252 Cramp and spasm: Secondary | ICD-10-CM | POA: Diagnosis present

## 2021-07-02 NOTE — Progress Notes (Signed)
ABI has been completed.  ? ?Preliminary results in CV Proc.  ? ?Teja Costen Sahian Kerney ?07/02/2021 8:29 AM    ?

## 2021-07-02 NOTE — Progress Notes (Signed)
Normal ABI. No evidence of arterial disease.

## 2021-07-31 ENCOUNTER — Encounter: Payer: Self-pay | Admitting: Physician Assistant

## 2021-07-31 MED ORDER — IBUPROFEN 600 MG PO TABS: 600.0000 mg | ORAL_TABLET | Freq: Three times a day (TID) | ORAL | 1 refills | Status: DC | PRN

## 2021-10-04 ENCOUNTER — Ambulatory Visit: Payer: No Typology Code available for payment source | Admitting: Physician Assistant

## 2021-10-08 ENCOUNTER — Ambulatory Visit (INDEPENDENT_AMBULATORY_CARE_PROVIDER_SITE_OTHER): Payer: No Typology Code available for payment source | Admitting: Physician Assistant

## 2021-10-08 ENCOUNTER — Encounter: Payer: Self-pay | Admitting: Physician Assistant

## 2021-10-08 VITALS — BP 138/83 | HR 106 | Ht 63.0 in | Wt 176.0 lb

## 2021-10-08 DIAGNOSIS — R000 Tachycardia, unspecified: Secondary | ICD-10-CM

## 2021-10-08 DIAGNOSIS — J301 Allergic rhinitis due to pollen: Secondary | ICD-10-CM

## 2021-10-08 DIAGNOSIS — R053 Chronic cough: Secondary | ICD-10-CM | POA: Insufficient documentation

## 2021-10-08 DIAGNOSIS — G478 Other sleep disorders: Secondary | ICD-10-CM

## 2021-10-08 DIAGNOSIS — R0602 Shortness of breath: Secondary | ICD-10-CM

## 2021-10-08 DIAGNOSIS — K8689 Other specified diseases of pancreas: Secondary | ICD-10-CM | POA: Diagnosis not present

## 2021-10-08 DIAGNOSIS — Z6830 Body mass index (BMI) 30.0-30.9, adult: Secondary | ICD-10-CM

## 2021-10-08 DIAGNOSIS — R7303 Prediabetes: Secondary | ICD-10-CM

## 2021-10-08 DIAGNOSIS — E6609 Other obesity due to excess calories: Secondary | ICD-10-CM

## 2021-10-08 LAB — POCT GLYCOSYLATED HEMOGLOBIN (HGB A1C): HbA1c, POC (prediabetic range): 6 % (ref 5.7–6.4)

## 2021-10-08 MED ORDER — FLUTICASONE PROPIONATE 50 MCG/ACT NA SUSP: 2.0000 | Freq: Every day | NASAL | 1 refills | Status: DC

## 2021-10-08 NOTE — Patient Instructions (Addendum)
Will order sleep study Will order chest CT Will order echo Trial flonase

## 2021-10-08 NOTE — Progress Notes (Signed)
Acute Office Visit  Subjective:     Patient ID: Wendy Anderson, female    DOB: 1970-11-14, 51 y.o.   MRN: [id number]  Chief Complaint  Patient presents with   Shortness of Breath    HPI Patient is in today to discuss concerns for shortness of breath. She has really been struggling since November 2021. She continues to have higher heart rate. Never smoked but parents did. Her cough is productive to dry. She is fatigued and does not feel rested in mornings. No peripheral edema.    .. 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   Prediabetes 04/11/2013   Tendinitis of left rotator cuff 04/11/2013   Gluten intolerance 04/11/2013   Vitamin D insufficiency 04/12/2013   Hemorrhoids 04/12/2013   Insomnia 04/12/2013   Degenerative disc disease, cervical 04/12/2013   Hematuria 04/12/2013   Hyperlipidemia 11/11/2013   Essential hypertension 12/07/2014   Chest discomfort 03/28/2015   Right shoulder pain 07/20/2015   Stress at home 02/27/2016   Anxiety state 02/27/2016   Sebaceous hyperplasia 04/28/2016   Cervical herniated disc 12/02/2016   Suspicious nevus 05/20/2017   Neck fullness 05/24/2017   Right hip pain 06/09/2017   Thyroid cyst 06/18/2017   Ganglion cyst of foot 09/20/2018   Onychomycosis 09/20/2018   Class 1 obesity due to excess calories without serious comorbidity with body mass index (BMI) of 30.0 to 30.9 in adult 09/02/2019   Hyperglyceridemia 09/13/2019   Diverticulosis 10/24/2019   Left hip pain 01/30/2020   Myalgia 01/30/2020   Paresthesia 01/30/2020   Positive ANA (antinuclear antibody) 01/30/2020   Leg cramping 01/30/2020   Localized osteoarthrosis of left hip 09/25/2020   Diverticulitis 02/22/2021   Left lower quadrant abdominal pain 02/22/2021   Atrophy of pancreas 02/25/2021   Tachycardia 10/08/2021   SOB (shortness of breath) 10/08/2021   Chronic cough  10/08/2021   Non-restorative sleep 10/21/2021   Resolved Ambulatory Problems    Diagnosis Date Noted   URI (upper respiratory infection) 02/15/2020   Past Medical History:  Diagnosis Date   Migraine      ROS  See HPI.     Objective:    BP 138/83   Pulse (!) 106   Ht '5\' 3"'$  (1.6 m)   Wt 176 lb (79.8 kg)   SpO2 97%   PF 500 L/min Comment: green  BMI 31.18 kg/m  BP Readings from Last 3 Encounters:  10/08/21 138/83  06/11/21 137/85  02/22/21 (!) 142/91   Wt Readings from Last 3 Encounters:  10/08/21 176 lb (79.8 kg)  06/11/21 172 lb (78 kg)  02/22/21 172 lb (78 kg)   6 min walk test  Pulse ox dropped to 93 percent after 6 minutes  Stop Santa Lighter is 3   Physical Exam Constitutional:      Appearance: She is well-developed.  HENT:     Head: Normocephalic.  Cardiovascular:     Rate and Rhythm: Normal rate and regular rhythm.  Pulmonary:     Effort: Pulmonary effort is normal.     Breath sounds: Normal breath sounds.  Musculoskeletal:     Cervical back: Normal range of motion.     Right lower leg: No edema.     Left lower leg: No edema.  Neurological:     General: No focal deficit present.     Mental Status: She is alert and oriented to person, place, and  time.  Psychiatric:        Mood and Affect: Mood normal.          Assessment & Plan:  Marland KitchenMarland KitchenDackeri was seen today for shortness of breath.  Diagnoses and all orders for this visit:  Tachycardia  SOB (shortness of breath)  Chronic cough  Atrophy of pancreas  Prediabetes -     POCT glycosylated hemoglobin (Hb A1C)  Non-restorative sleep -     Split night study  Class 1 obesity due to excess calories without serious comorbidity with body mass index (BMI) of 30.0 to 30.9 in adult -     Split night study  Seasonal allergic rhinitis due to pollen -     fluticasone (FLONASE) 50 MCG/ACT nasal spray; Place 2 sprays into both nostrils daily.   Will continue to evaluate SOB.  Will get sleep study.   Will get chest CT for cough and SOB.  Will get echo for SOB.  Follow up with any new or worsening symptoms  Iran Planas, PA-C

## 2021-10-21 DIAGNOSIS — G478 Other sleep disorders: Secondary | ICD-10-CM | POA: Insufficient documentation

## 2021-10-23 ENCOUNTER — Encounter: Payer: Self-pay | Admitting: Neurology

## 2021-10-23 ENCOUNTER — Telehealth: Payer: Self-pay | Admitting: Neurology

## 2021-10-23 NOTE — Telephone Encounter (Signed)
Patient left vm about her referrals.   CT is cheaper at [facility] than downstairs and would like referral for them sent to [facility].   Sleep Study - she wants to know where this is being sent so she can contact them about cost.   Please follow up with patient on referrals. [phone].

## 2021-10-31 NOTE — Telephone Encounter (Signed)
Task completed. Order faxed to [facility] in [facility] (CT are not completed in their [place] location) at [phone]. Fax confirmation received. Patient has been updated with the latest outcome. No inquiries during the call.

## 2021-11-04 ENCOUNTER — Encounter: Payer: Self-pay | Admitting: Physician Assistant

## 2021-11-04 DIAGNOSIS — G478 Other sleep disorders: Secondary | ICD-10-CM

## 2021-11-05 ENCOUNTER — Ambulatory Visit (HOSPITAL_BASED_OUTPATIENT_CLINIC_OR_DEPARTMENT_OTHER)
Admission: RE | Admit: 2021-11-05 | Discharge: 2021-11-05 | Disposition: A | Discharge status: Home / Self Care | Payer: No Typology Code available for payment source | Source: Ambulatory Visit | Attending: Physician Assistant | Admitting: Physician Assistant

## 2021-11-05 DIAGNOSIS — R053 Chronic cough: Secondary | ICD-10-CM | POA: Insufficient documentation

## 2021-11-05 DIAGNOSIS — R0602 Shortness of breath: Secondary | ICD-10-CM | POA: Diagnosis present

## 2021-11-05 LAB — ECHOCARDIOGRAM COMPLETE
Area-P 1/2: 4.31 cm2
S' Lateral: 2.7 cm

## 2021-11-05 NOTE — Progress Notes (Signed)
  Echocardiogram 2D Echocardiogram has been performed.  Wendy Anderson F 11/05/2021, 5:09 PM

## 2021-11-06 NOTE — Progress Notes (Signed)
Wendy Anderson,   Your echo showed great ejection fraction at 60-65 percent which is normal. You have a little diastolic dysfunction but not concerning.  Your valves look great.   Great echo!

## 2022-01-08 ENCOUNTER — Telehealth: Payer: Self-pay | Admitting: Neurology

## 2022-01-08 NOTE — Telephone Encounter (Signed)
Patient left vm wanting a call back with CT results. CT chest done at [facility]. We have not received results yet. Will call and request.

## 2022-01-10 IMAGING — MG DIGITAL SCREENING BILAT W/ TOMO W/ CAD
6 of 10 series · 6 of 30 positions shown · non-contrast
Comparison: Previous exam(s).

CLINICAL DATA: Screening.

EXAM:
DIGITAL SCREENING BILATERAL MAMMOGRAM WITH TOMO AND CAD

[R MLO synth-2D]
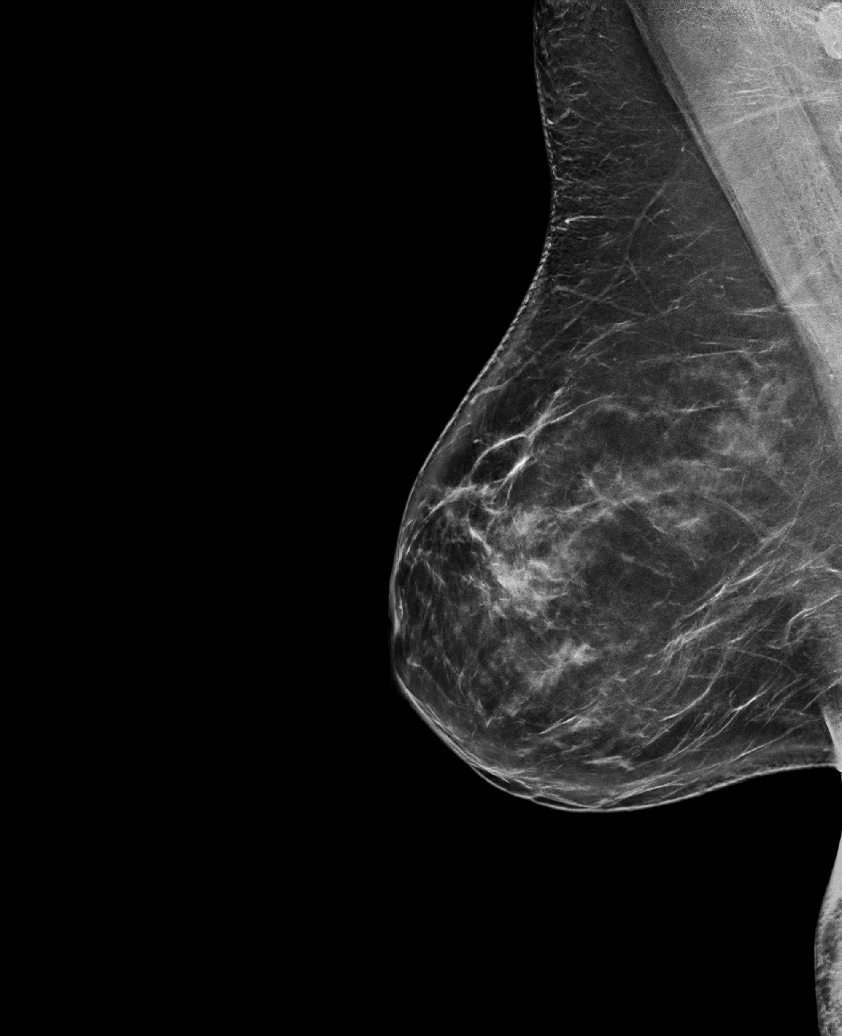

[L MLO synth-2D]
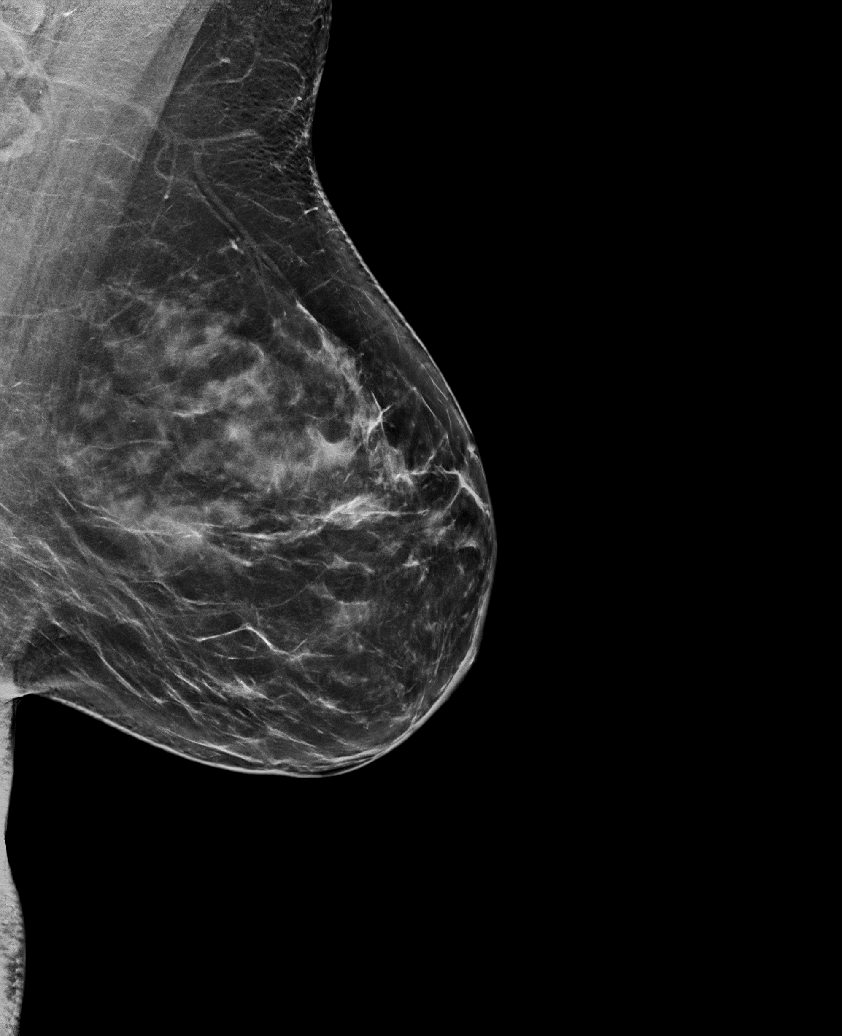

[L XCCL synth-2D]
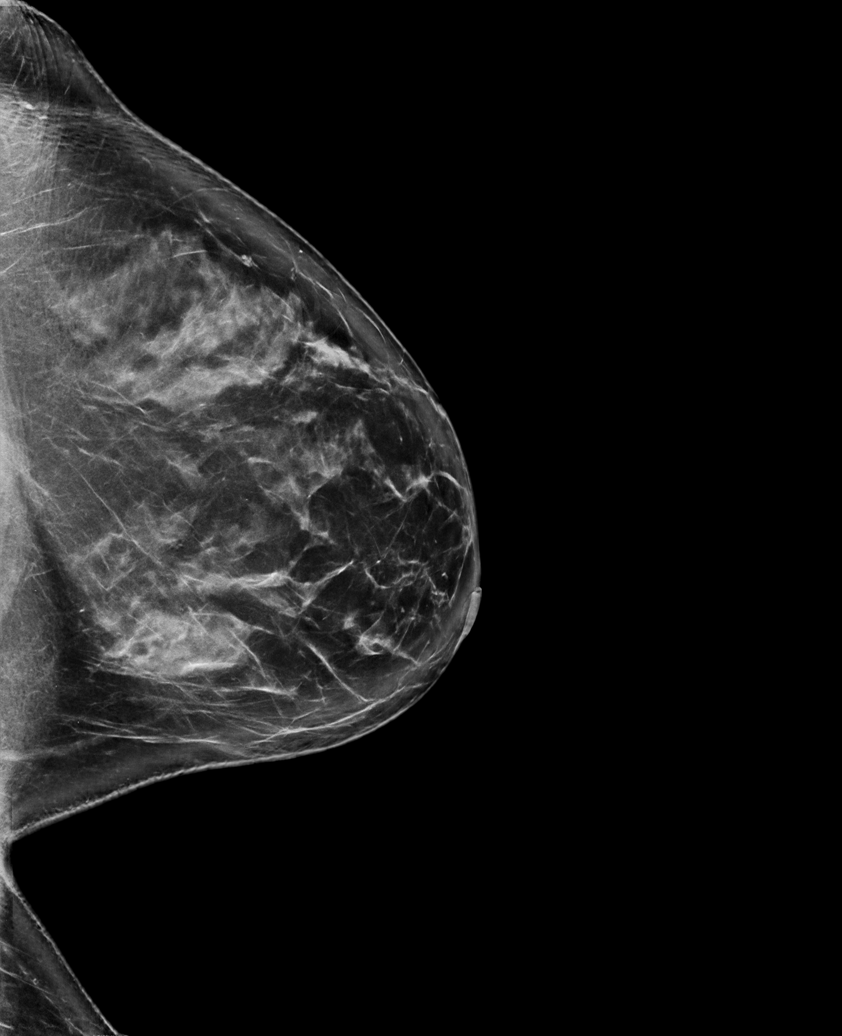

[R CC synth-2D]
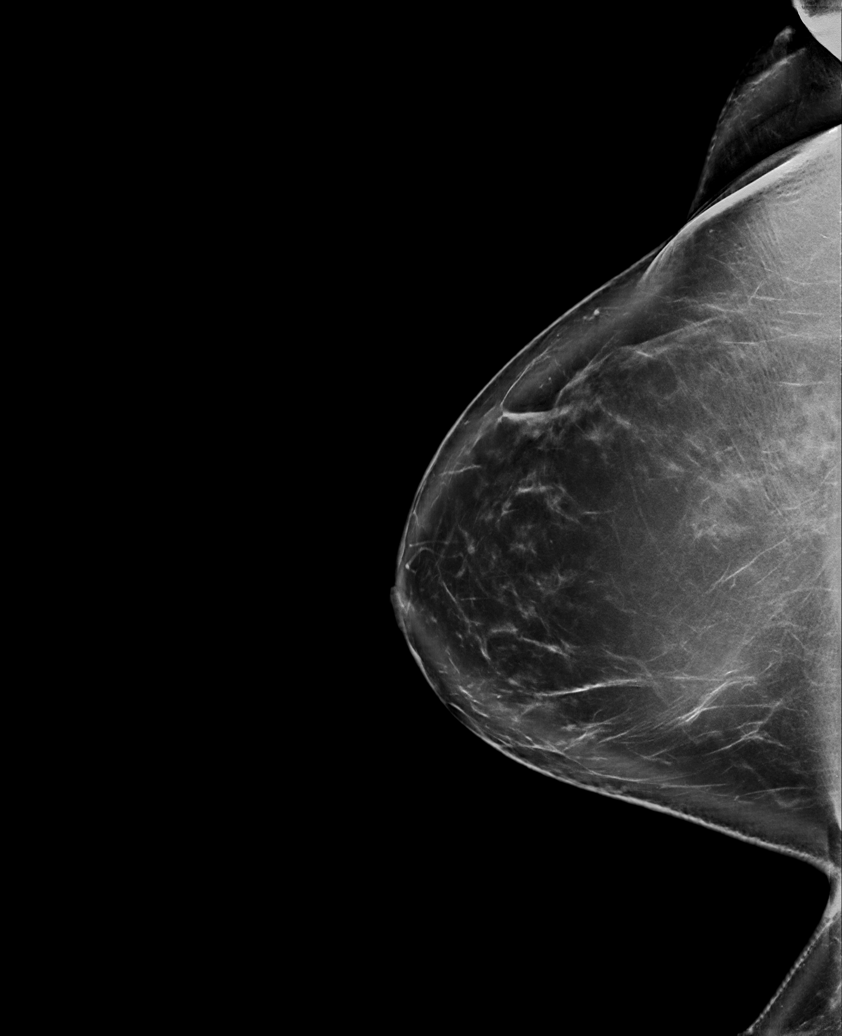

[L CC synth-2D]
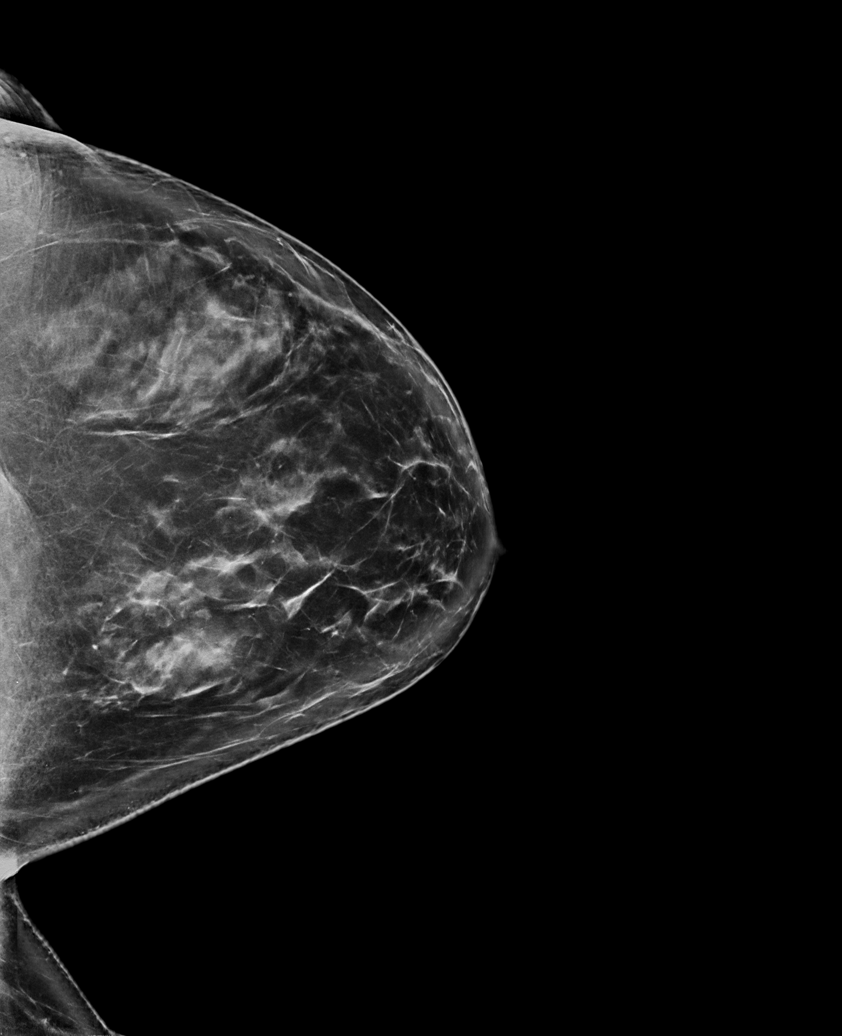

[R CC tomo · tomo slice 53/104.0]
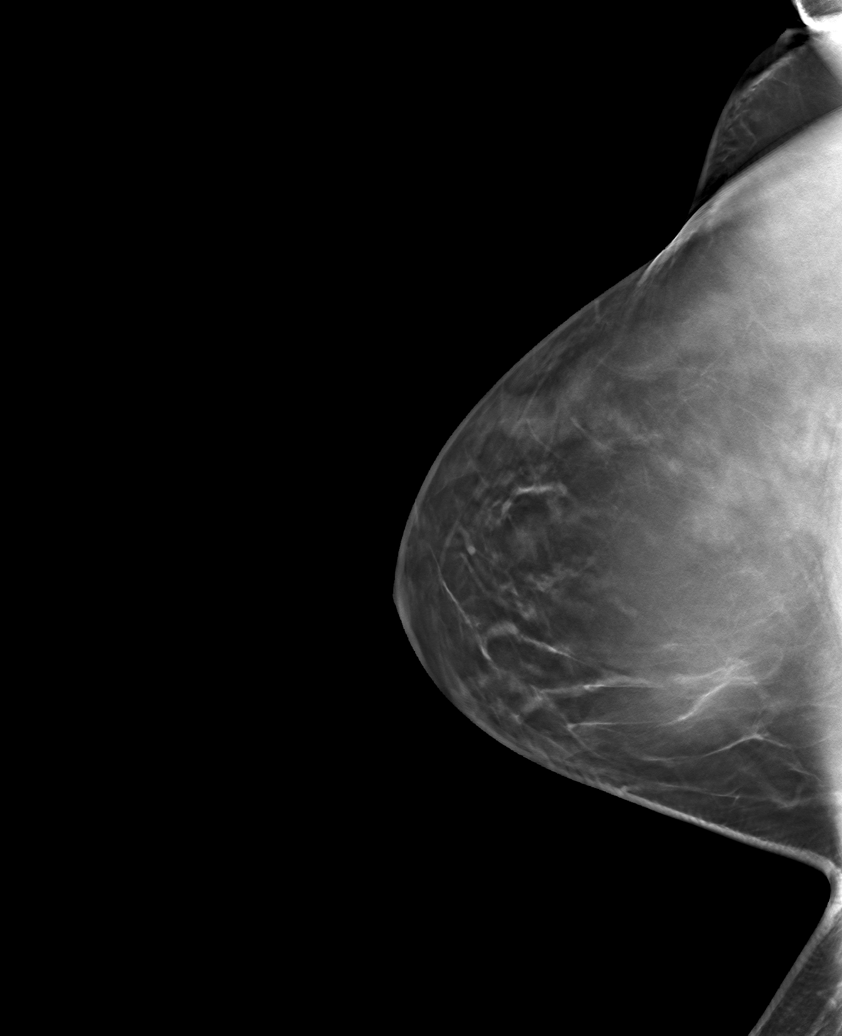

[6 of 30 positions shown; findings below may reference images not displayed]

ACR Breast Density Category c: The breast tissue is heterogeneously
dense, which may obscure small masses.
FINDINGS: There are no findings suspicious for malignancy. Images were
processed with CAD.
IMPRESSION: No mammographic evidence of malignancy. A result letter of this
screening mammogram will be mailed directly to the patient.

RECOMMENDATION:
Screening mammogram in one year. (Code:[id number])

BI-RADS CATEGORY  1: Negative.

## 2022-01-13 ENCOUNTER — Encounter: Payer: Self-pay | Admitting: Physician Assistant

## 2022-01-14 NOTE — Telephone Encounter (Signed)
Levada Dy called to request CT results.

## 2022-01-29 NOTE — Telephone Encounter (Signed)
CT results finally received after Vanicia reached out to supervisor at [facility]. Called patient with normal results. Copy of scanned at the front for patient pick up.

## 2022-03-11 ENCOUNTER — Encounter: Payer: Self-pay | Admitting: Family Medicine

## 2022-03-11 ENCOUNTER — Telehealth (INDEPENDENT_AMBULATORY_CARE_PROVIDER_SITE_OTHER): Payer: No Typology Code available for payment source | Admitting: Family Medicine

## 2022-03-11 VITALS — BP 142/89 | HR 84 | Wt 175.0 lb

## 2022-03-11 DIAGNOSIS — R109 Unspecified abdominal pain: Secondary | ICD-10-CM

## 2022-03-11 DIAGNOSIS — R5383 Other fatigue: Secondary | ICD-10-CM | POA: Diagnosis not present

## 2022-03-11 DIAGNOSIS — W57XXXA Bitten or stung by nonvenomous insect and other nonvenomous arthropods, initial encounter: Secondary | ICD-10-CM

## 2022-03-11 DIAGNOSIS — J0110 Acute frontal sinusitis, unspecified: Secondary | ICD-10-CM | POA: Diagnosis not present

## 2022-03-11 DIAGNOSIS — S50861A Insect bite (nonvenomous) of right forearm, initial encounter: Secondary | ICD-10-CM

## 2022-03-11 MED ORDER — AMOXICILLIN-POT CLAVULANATE 875-125 MG PO TABS: 1.0000 | ORAL_TABLET | Freq: Two times a day (BID) | ORAL | 0 refills | Status: DC

## 2022-03-11 NOTE — Progress Notes (Signed)
I connected with  Arlyss Gandy on 03/11/22 by a video enabled telemedicine application and verified that I am speaking with the correct person using two identifiers.   I discussed the limitations of evaluation and management by telemedicine. The patient expressed understanding and agreed to proceed.  Acute Office Visit  Subjective:     Patient ID: Wendy Anderson, female    DOB: 1971/03/01, 51 y.o.   MRN: [id number]  Chief Complaint  Patient presents with   Acute Visit    Patient virtual visit.- patient c/o flu like symptoms x 2 weeks - fever, chills, bodyaches, malaise, lingering head congestion - but for the past 3 to 4 days  extreme fatigue amalaise    Insect Bite    Possible spider bite  R forearm- patient states site has clear center with single puncture wound surrounded what appears to be a ciruclar bruise. Patietn states the same thing happened about 5 years ago in almost the same site but at that time it appeared as  two puncture wounds instead of one - she did find a spider under her pillow with episode 5 years ago. This time the bite source  was unknown.    Flank Pain    Patient also c/o L kidney pain x this morning.     Flank Pain Pertinent negatives include no dysuria or fever.   Patient is in today for acute visit  Pt unable to view provider but provider able to hear and see patient using telephone and video techniques.  Pt says she has a lot going on and multiple complaints today. She reports she's trying to close on her home. She is living with her daughter and son in law. Daughter just had ectopic pregnancy.  Her father recently passed in the last month. Her husband had the flu a few weeks ago. She then developed symptoms such as facial congestion and nasal congestion with a cough. She's had 3 cold sores in the last few weeks. She uses topical lidocaine that works for this. In the last 3-4 days she has been fatigued and felt like she's been hit by a truck. She took Naproxen  that helped some. She reports left flank pain in the last few days. Reports not drinking enough water. She also states she suspect insect bite to her right forearm. Noticed a bruise with central hard area and saw a puncture wound. No redness or streaking. No edema or fevers.   Review of Systems  Constitutional:  Positive for chills and malaise/fatigue. Negative for fever.  HENT:  Positive for congestion and sinus pain.   Respiratory:  Positive for cough. Negative for shortness of breath and wheezing.   Gastrointestinal:        Left flank pain  Genitourinary:  Positive for flank pain. Negative for dysuria, hematuria and urgency.  Musculoskeletal:  Negative for myalgias.  Psychiatric/Behavioral:  Negative for depression. The patient does not have insomnia.   All other systems reviewed and are negative.       Objective:    BP (!) 142/89 Comment: patient self reporting  Pulse 84 Comment: patient self reporting  Wt 175 lb (79.4 kg) Comment: patient is self reporting  SpO2 97% Comment: patient is self reporting  BMI 31.00 kg/m    Physical Exam Vitals and nursing note reviewed.  Constitutional:      Appearance: Normal appearance. She is normal weight.  HENT:     Head: Normocephalic and atraumatic.     Right Ear: External ear normal.  Left Ear: External ear normal.     Nose: Nose normal.  Eyes:     Extraocular Movements: Extraocular movements intact.     Conjunctiva/sclera: Conjunctivae normal.  Pulmonary:     Effort: Pulmonary effort is normal. No respiratory distress.  Skin:    Capillary Refill: Capillary refill takes less than 2 seconds.     Findings: Bruising present.     Comments: To right forearm with visible puncture mark. No erythema or edema, no streaking  Neurological:     General: No focal deficit present.     Mental Status: She is alert and oriented to person, place, and time. Mental status is at baseline.  Psychiatric:        Mood and Affect: Mood normal.         Behavior: Behavior normal.        Thought Content: Thought content normal.        Judgment: Judgment normal.    No results found for any visits on 03/11/22.      Assessment & Plan:   Problem List Items Addressed This Visit   None Visit Diagnoses     Acute non-recurrent frontal sinusitis    -  Primary   Relevant Medications   amoxicillin-clavulanate (AUGMENTIN) 875-125 MG tablet   Other fatigue       Insect bite of right forearm, initial encounter       Left flank pain           Meds ordered this encounter  Medications   amoxicillin-clavulanate (AUGMENTIN) 875-125 MG tablet    Sig: Take 1 tablet by mouth 2 (two) times daily.    Dispense:  14 tablet    Refill:  0   URI symptoms consistent with sinusitis. Treat with Augmentin For fatigue, advised pt could be from dehydration with left flank pain. Kidney stones can develop with dehydration. Advised to drink plenty of water 64 oz or more daily. If fatigue or symptoms continues after Augmentin, see PCP for additional evaluation and possible lab work.  Bruise and puncture wound no alarming symptoms or evidence of cellulitis. Monitor for now.  No follow-ups on file.  Leeanne Rio, MD

## 2022-06-02 ENCOUNTER — Ambulatory Visit: Payer: No Typology Code available for payment source | Admitting: Sports Medicine

## 2022-06-02 DIAGNOSIS — M1612 Unilateral primary osteoarthritis, left hip: Secondary | ICD-10-CM | POA: Diagnosis not present

## 2022-06-02 MED ORDER — TRAMADOL HCL 50 MG PO TABS: 50.0000 mg | ORAL_TABLET | Freq: Three times a day (TID) | ORAL | 0 refills | Status: DC | PRN

## 2022-06-02 MED ORDER — PREDNISONE 50 MG PO TABS: ORAL_TABLET | ORAL | 0 refills | Status: DC

## 2022-06-02 NOTE — Progress Notes (Signed)
    Procedures performed today:    None.  Independent interpretation of notes and tests performed by another provider:   None.  Brief History, Exam, Impression, and Recommendations:    Primary osteoarthritis of left hip Very pleasant 52 year old female status post right hip arthroplasty and doing well, having increasing pain left hip, known arthritis. She is interested in arthroplasty but is unfortunately having intractable pain now. On exam she has significant loss of internal rotation with pain. She is not interested in an injection if she is looking into arthroplasty sometime this summer and understands she might have to wait 3 months after injection. We will do prednisone and tramadol, home conditioning, she can return to see me as needed. She already has a Psychologist, sport and exercise that she is established with.    ____________________________________________ Gwen Her. Dianah Field, M.D., ABFM., CAQSM., AME. Primary Care and Sports Medicine [facility]  Adjunct Professor of [employer]  Risk manager

## 2022-06-02 NOTE — Assessment & Plan Note (Signed)
Very pleasant 52 year old female status post right hip arthroplasty and doing well, having increasing pain left hip, known arthritis. She is interested in arthroplasty but is unfortunately having intractable pain now. On exam she has significant loss of internal rotation with pain. She is not interested in an injection if she is looking into arthroplasty sometime this summer and understands she might have to wait 3 months after injection. We will do prednisone and tramadol, home conditioning, she can return to see me as needed. She already has a Psychologist, sport and exercise that she is established with.

## 2022-06-06 ENCOUNTER — Telehealth: Payer: Self-pay | Admitting: Family Medicine

## 2022-06-06 DIAGNOSIS — Z1231 Encounter for screening mammogram for malignant neoplasm of breast: Secondary | ICD-10-CM

## 2022-06-06 NOTE — Addendum Note (Signed)
Addended by: Rae Lips on: 06/06/2022 02:52 PM   Modules accepted: Orders

## 2022-06-06 NOTE — Telephone Encounter (Signed)
P;s call to see whre gets mammo or schedule

## 2022-06-10 ENCOUNTER — Other Ambulatory Visit: Payer: Self-pay | Admitting: Physician Assistant

## 2022-06-10 DIAGNOSIS — R1032 Left lower quadrant pain: Secondary | ICD-10-CM

## 2022-07-07 ENCOUNTER — Encounter: Payer: Self-pay | Admitting: Physician Assistant

## 2022-07-07 ENCOUNTER — Ambulatory Visit (INDEPENDENT_AMBULATORY_CARE_PROVIDER_SITE_OTHER): Payer: No Typology Code available for payment source | Admitting: Physician Assistant

## 2022-07-07 VITALS — BP 133/74 | HR 67 | Ht 63.0 in | Wt 176.0 lb

## 2022-07-07 DIAGNOSIS — M1612 Unilateral primary osteoarthritis, left hip: Secondary | ICD-10-CM

## 2022-07-07 DIAGNOSIS — G478 Other sleep disorders: Secondary | ICD-10-CM | POA: Diagnosis not present

## 2022-07-07 DIAGNOSIS — E6609 Other obesity due to excess calories: Secondary | ICD-10-CM

## 2022-07-07 DIAGNOSIS — N951 Menopausal and female climacteric states: Secondary | ICD-10-CM

## 2022-07-07 DIAGNOSIS — Z0000 Encounter for general adult medical examination without abnormal findings: Secondary | ICD-10-CM

## 2022-07-07 DIAGNOSIS — R5383 Other fatigue: Secondary | ICD-10-CM | POA: Diagnosis not present

## 2022-07-07 DIAGNOSIS — B2700 Gammaherpesviral mononucleosis without complication: Secondary | ICD-10-CM

## 2022-07-07 DIAGNOSIS — Z780 Asymptomatic menopausal state: Secondary | ICD-10-CM

## 2022-07-07 MED ORDER — NAPROXEN 500 MG PO TABS
500.0000 mg | ORAL_TABLET | Freq: Two times a day (BID) | ORAL | 3 refills | Status: DC
Start: 2022-07-07 — End: 2024-01-26

## 2022-07-07 NOTE — Progress Notes (Unsigned)
Complete physical exam  Patient: Wendy Anderson   DOB: [birth year]   52 y.o. Female  MRN: [id number]  Subjective:    Chief Complaint  Patient presents with   Annual Exam    Wendy Anderson is a 52 y.o. female who presents today for a complete physical exam. She reports consuming a {diet types:17450} diet. {types:19826} She generally feels {DESC; WELL/FAIRLY WELL/POORLY:18703}. She reports sleeping {DESC; WELL/FAIRLY WELL/POORLY:18703}. She {does/does not:200015} have additional problems to discuss today.    Most recent fall risk assessment:    06/11/2021    9:57 AM  Fall Risk   Falls in the past year? 0  Number falls in past yr: 0  Injury with Fall? 0  Risk for fall due to : No Fall Risks  Follow up Falls evaluation completed     Most recent depression screenings:    07/07/2022    9:32 AM 10/08/2021    3:37 PM  PHQ 2/9 Scores  PHQ - 2 Score 0 2  PHQ- 9 Score  7    {VISON DENTAL STD PSA (Optional):27386}  {History (Optional):23778}  Patient Care Team: Nolene Ebbs as PCP - General (Family Medicine)   Outpatient Medications Prior to Visit  Medication Sig   Cholecalciferol (D3 5000) 125 MCG (5000 UT) capsule Take 5,000 Units by mouth daily.   CINNAMON PO Take 1,200 mg by mouth daily.   Coenzyme Q10 (CO Q 10 PO) Take 200 mg by mouth daily.   [employer] TRIPLE MAGNESIUM COMPLEX PO Take by mouth. Glycinate 200 mg Malate 150 mg  Citrate 50 mg  Taurate 100 mg   DOTTI 0.025 MG/24HR Place 1 patch onto the skin 2 (two) times a week.   GARLIC PO Take 7,782 mg by mouth daily.   ibuprofen (ADVIL) 600 MG tablet Take 1 tablet (600 mg total) by mouth every 8 (eight) hours as needed.   MAGNESIUM PO Take 1 tablet by mouth daily. L-theonate 2207 mg   progesterone (PROMETRIUM) 100 MG capsule Take 100 mg by mouth daily.   traMADol (ULTRAM) 50 MG tablet Take 1 tablet (50 mg total) by mouth every 8 (eight) hours as needed for moderate pain.   TURMERIC PO Take by mouth daily.     [DISCONTINUED] naproxen (NAPROSYN) 500 MG tablet Take 500 mg by mouth 2 (two) times daily with a meal.   Omega-3 Fatty Acids (FISH OIL) 1000 MG CAPS Take by mouth.   valACYclovir (VALTREX) 1000 MG tablet Take 1,000 mg by mouth 3 (three) times daily.   [DISCONTINUED] amoxicillin-clavulanate (AUGMENTIN) 875-125 MG tablet Take 1 tablet by mouth 2 (two) times daily.   [DISCONTINUED] fluticasone (FLONASE) 50 MCG/ACT nasal spray Place 2 sprays into both nostrils daily.   [DISCONTINUED] predniSONE (DELTASONE) 50 MG tablet One tab PO daily for 5 days.   No facility-administered medications prior to visit.    ROS        Objective:     BP 133/74   Pulse 67   Ht 5\' 3"  (1.6 m)   Wt 176 lb (79.8 kg)   SpO2 99%   BMI 31.18 kg/m  {Vitals History (Optional):23777}  Physical Exam   No results found for any visits on 07/07/22. {Show previous labs (optional):23779}    Assessment & Plan:    Routine Health Maintenance and Physical Exam  Immunization History  Administered Date(s) Administered   Influenza,inj,Quad PF,6+ Mos 02/13/2012, 03/12/2015, 03/20/2017, 02/22/2021   Influenza-Unspecified 12/07/2017   Janssen (J&J) SARS-COV-2 Vaccination 11/21/2019   PPD  Test 10/28/2017   Tdap 03/24/2009, 02/22/2021   Varicella 03/24/2009    Health Maintenance  Topic Date Due   MAMMOGRAM  02/27/2022   COVID-19 Vaccine (2 - 2023-24 season) 07/23/2022 (Originally 11/22/2021)   Zoster Vaccines- Shingrix (1 of 2) 10/06/2022 (Originally 05/03/2018)   INFLUENZA VACCINE  10/23/2022   COLONOSCOPY (Pts 45-57yrs Insurance coverage will need to be confirmed)  10/12/2024   DTaP/Tdap/Td (3 - Td or Tdap) 02/23/2031   Hepatitis C Screening  Completed   HIV Screening  Completed   HPV VACCINES  Aged Out    Discussed health benefits of physical activity, and encouraged her to engage in regular exercise appropriate for her age and condition.  [name] was seen today for annual exam.  Diagnoses and all orders  for this visit:  Routine physical examination  Non-restorative sleep -     Home sleep test; Future  Other fatigue -     Home sleep test; Future  Class 1 obesity due to excess calories without serious comorbidity with body mass index (BMI) of 30.0 to 30.9 in adult -     Home sleep test; Future  Post-menopause  Primary osteoarthritis of left hip -     naproxen (NAPROSYN) 500 MG tablet; Take 1 tablet (500 mg total) by mouth 2 (two) times daily with a meal.  Chronic Epstein-Barr virus (EBV) infection syndrome  Menopausal symptoms   .Marland Kitchen Discussed 150 minutes of exercise a week.  Encouraged vitamin D 1000 units and Calcium  or 4 servings of dairy a day.  Labs complete with functional doctor Last A1C was 5.5  Declined shingles Ordered go downstairs Total hip 4/25    [name], PA-C

## 2022-07-07 NOTE — Patient Instructions (Addendum)
Insulin Resistance  Insulin is a hormone that is made by the pancreas. Insulin allows blood sugar (glucose) to enter the cells in the body. Insulin helps the body use glucose for energy. Normally, the body is insulin sensitive, which means the cells in the body are effective at absorbing glucose. Insulin resistance is when the cells in the body do not respond properly to insulin and are not able to absorb glucose. The pancreas makes more insulin, but over time the body cannot make enough insulin to keep glucose at normal levels. Insulin resistance results in high blood glucose levels (hyperglycemia) and can lead to problems, including: Prediabetes. Type 2 diabetes (diabetes mellitus). Heart disease. High blood pressure (hypertension). Stroke. Polycystic ovary syndrome (PCOS). Nonalcoholic fatty liver disease. What are the causes? The exact cause of insulin resistance is not known. What increases the risk? The following factors may make you more likely to develop insulin resistance: Being overweight or obese, especially if a lot of your weight is in your waist area. Having an inactive (sedentary) lifestyle. Having above-normal glucose levels. Having abnormal cholesterol levels. Having sleep apnea. Being older than age 45. Using steroids. What are the signs or symptoms? This condition usually does not cause symptoms. A waist measurement of more than 35 inches (88.9 cm) for women and more than 40 inches (101.6 cm) for men may be a sign of insulin resistance. How is this diagnosed? There is no test to diagnose insulin resistance. However, your health care provider may diagnose insulin resistance based on: A physical exam. Your medical history. Blood tests that check your blood glucose level. How is this treated? Insulin resistance is treated with nutrition and lifestyle changes. These changes may include: Eating a healthy balance of nutritious foods. Getting more physical  activity. Maintaining a healthy weight. Stopping the use of any tobacco products. Your health care provider will work with you to change your nutrition and lifestyle as needed. In some cases, treatment may also include medicine to improve your insulin sensitivity. Follow these instructions at home: Activity Be physically active. Do moderate-intensity physical activity for at least 30 minutes on 5 or more days of the week, or as told by your health care provider. This could include brisk walking, biking, or water aerobics. Ask your health care provider what activities are safe for you. A mix of physical activities may be best, such as walking, swimming, biking, and strength training. Eating and drinking  Follow a healthy meal plan. This includes eating: Lean proteins. Complex carbohydrates. Examples of these include whole grains, starchy vegetables (potatoes, corn, peas), and beans. Fresh fruits and vegetables. Low-fat dairy products. Healthy fats. Follow instructions from your health care provider about eating or drinking restrictions. Make an appointment to see a diet and nutrition specialist (registered dietitian) to help you create a healthy eating plan. General instructions Check your blood glucose levels as told by your health care provider. Take over-the-counter and prescription medicines only as told by your health care provider. Lose weight as told by your health care provider. Losing 5-7% of your body weight can reverse insulin resistance. Your health care provider can determine how much weight loss is best for you and can help you lose weight safely. Do not use any products that contain nicotine or tobacco. These products include cigarettes, chewing tobacco, and vaping devices, such as e-cigarettes. If you need help quitting, ask your health care provider. Keep all follow-up visits. This is important. Contact a health care provider if: You have trouble losing   weight or  maintaining your goal weight. You gain weight. You have trouble following your prescribed meal plan. You have trouble exercising more. Summary Insulin resistance occurs when cells in the body do not respond properly to insulin and are not able to absorb blood sugar (glucose). The body makes more insulin, but over time the body cannot make enough insulin to keep blood sugar at normal levels. Insulin resistance is treated with nutrition and lifestyle changes, including eating a healthy balance of nutritious foods, getting more physical activity, and maintaining a healthy weight. Your health care provider will work with you to change your nutrition and lifestyle as needed. Treatment may also include medicine to improve your insulin sensitivity. Check your blood glucose levels as told by your health care provider. Keep all follow-up visits. This is important. This information is not intended to replace advice given to you by your health care provider. Make sure you discuss any questions you have with your health care provider. Document Revised: 11/28/2019 Document Reviewed: 11/28/2019 Elsevier Patient Education  2023 Elsevier Inc.   Health Maintenance, Female Adopting a healthy lifestyle and getting preventive care are important in promoting health and wellness. Ask your health care provider about: The right schedule for you to have regular tests and exams. Things you can do on your own to prevent diseases and keep yourself healthy. What should I know about diet, weight, and exercise? Eat a healthy diet  Eat a diet that includes plenty of vegetables, fruits, low-fat dairy products, and lean protein. Do not eat a lot of foods that are high in solid fats, added sugars, or sodium. Maintain a healthy weight Body mass index (BMI) is used to identify weight problems. It estimates body fat based on height and weight. Your health care provider can help determine your BMI and help you achieve or  maintain a healthy weight. Get regular exercise Get regular exercise. This is one of the most important things you can do for your health. Most adults should: Exercise for at least 150 minutes each week. The exercise should increase your heart rate and make you sweat (moderate-intensity exercise). Do strengthening exercises at least twice a week. This is in addition to the moderate-intensity exercise. Spend less time sitting. Even light physical activity can be beneficial. Watch cholesterol and blood lipids Have your blood tested for lipids and cholesterol at 54 years of age, then have this test every 5 years. Have your cholesterol levels checked more often if: Your lipid or cholesterol levels are high. You are older than 54 years of age. You are at high risk for heart disease. What should I know about cancer screening? Depending on your health history and family history, you may need to have cancer screening at various ages. This may include screening for: Breast cancer. Cervical cancer. Colorectal cancer. Skin cancer. Lung cancer. What should I know about heart disease, diabetes, and high blood pressure? Blood pressure and heart disease High blood pressure causes heart disease and increases the risk of stroke. This is more likely to develop in people who have high blood pressure readings or are overweight. Have your blood pressure checked: Every 3-5 years if you are 44-42 years of age. Every year if you are 16 years old or older. Diabetes Have regular diabetes screenings. This checks your fasting blood sugar level. Have the screening done: Once every three years after age 55 if you are at a normal weight and have a low risk for diabetes. More often and at a  younger age if you are overweight or have a high risk for diabetes. What should I know about preventing infection? Hepatitis B If you have a higher risk for hepatitis B, you should be screened for this virus. Talk with your health  care provider to find out if you are at risk for hepatitis B infection. Hepatitis C Testing is recommended for: Everyone born from [birth year] through 1967. Anyone with known risk factors for hepatitis C. Sexually transmitted infections (STIs) Get screened for STIs, including gonorrhea and chlamydia, if: You are sexually active and are younger than 54 years of age. You are older than 54 years of age and your health care provider tells you that you are at risk for this type of infection. Your sexual activity has changed since you were last screened, and you are at increased risk for chlamydia or gonorrhea. Ask your health care provider if you are at risk. Ask your health care provider about whether you are at high risk for HIV. Your health care provider may recommend a prescription medicine to help prevent HIV infection. If you choose to take medicine to prevent HIV, you should first get tested for HIV. You should then be tested every 3 months for as long as you are taking the medicine. Pregnancy If you are about to stop having your period (premenopausal) and you may become pregnant, seek counseling before you get pregnant. Take 400 to 800 micrograms (mcg) of folic acid every day if you become pregnant. Ask for birth control (contraception) if you want to prevent pregnancy. Osteoporosis and menopause Osteoporosis is a disease in which the bones lose minerals and strength with aging. This can result in bone fractures. If you are 28 years old or older, or if you are at risk for osteoporosis and fractures, ask your health care provider if you should: Be screened for bone loss. Take a calcium or vitamin D supplement to lower your risk of fractures. Be given hormone replacement therapy (HRT) to treat symptoms of menopause. Follow these instructions at home: Alcohol use Do not drink alcohol if: Your health care provider tells you not to drink. You are pregnant, may be pregnant, or are planning to become  pregnant. If you drink alcohol: Limit how much you have to: 0-1 drink a day. Know how much alcohol is in your drink. In the [place] one drink equals one 12 oz bottle of beer (355 mL), one 5 oz glass of wine (148 mL), or one 1 oz glass of hard liquor (44 mL). Lifestyle Do not use any products that contain nicotine or tobacco. These products include cigarettes, chewing tobacco, and vaping devices, such as e-cigarettes. If you need help quitting, ask your health care provider. Do not use street drugs. Do not share needles. Ask your health care provider for help if you need support or information about quitting drugs. General instructions Schedule regular health, dental, and eye exams. Stay current with your vaccines. Tell your health care provider if: You often feel depressed. You have ever been abused or do not feel safe at home. Summary Adopting a healthy lifestyle and getting preventive care are important in promoting health and wellness. Follow your health care provider's instructions about healthy diet, exercising, and getting tested or screened for diseases. Follow your health care provider's instructions on monitoring your cholesterol and blood pressure. This information is not intended to replace advice given to you by your health care provider. Make sure you discuss any questions you have with your health care  provider. Document Revised: 07/30/2020 Document Reviewed: 07/30/2020 Elsevier Patient Education  [employer].

## 2022-07-08 ENCOUNTER — Encounter: Payer: Self-pay | Admitting: Physician Assistant

## 2022-07-08 DIAGNOSIS — N951 Menopausal and female climacteric states: Secondary | ICD-10-CM | POA: Insufficient documentation

## 2022-07-08 DIAGNOSIS — B2700 Gammaherpesviral mononucleosis without complication: Secondary | ICD-10-CM | POA: Insufficient documentation

## 2022-07-17 DIAGNOSIS — Z96642 Presence of left artificial hip joint: Secondary | ICD-10-CM | POA: Insufficient documentation

## 2022-07-23 ENCOUNTER — Encounter: Payer: Self-pay | Admitting: Physician Assistant

## 2022-08-08 ENCOUNTER — Ambulatory Visit (HOSPITAL_BASED_OUTPATIENT_CLINIC_OR_DEPARTMENT_OTHER): Payer: No Typology Code available for payment source | Attending: Physician Assistant | Admitting: Internal Medicine

## 2022-08-08 DIAGNOSIS — Z6830 Body mass index (BMI) 30.0-30.9, adult: Secondary | ICD-10-CM | POA: Diagnosis not present

## 2022-08-08 DIAGNOSIS — E6609 Other obesity due to excess calories: Secondary | ICD-10-CM | POA: Insufficient documentation

## 2022-08-08 DIAGNOSIS — R5383 Other fatigue: Secondary | ICD-10-CM | POA: Insufficient documentation

## 2022-08-08 DIAGNOSIS — G478 Other sleep disorders: Secondary | ICD-10-CM | POA: Diagnosis present

## 2022-08-11 ENCOUNTER — Ambulatory Visit: Payer: No Typology Code available for payment source | Admitting: Family Medicine

## 2022-08-11 ENCOUNTER — Encounter: Payer: Self-pay | Admitting: Family Medicine

## 2022-08-11 VITALS — BP 133/90 | HR 88 | Temp 98.2°F | Ht 63.0 in | Wt 173.0 lb

## 2022-08-11 DIAGNOSIS — J40 Bronchitis, not specified as acute or chronic: Secondary | ICD-10-CM

## 2022-08-11 DIAGNOSIS — J029 Acute pharyngitis, unspecified: Secondary | ICD-10-CM | POA: Diagnosis not present

## 2022-08-11 DIAGNOSIS — J329 Chronic sinusitis, unspecified: Secondary | ICD-10-CM | POA: Insufficient documentation

## 2022-08-11 LAB — POC COVID19 BINAXNOW: SARS Coronavirus 2 Ag: NEGATIVE

## 2022-08-11 LAB — POCT RAPID STREP A (OFFICE): Rapid Strep A Screen: NEGATIVE

## 2022-08-11 MED ORDER — AMOXICILLIN 875 MG PO TABS: 875.0000 mg | ORAL_TABLET | Freq: Two times a day (BID) | ORAL | 0 refills | Status: AC

## 2022-08-11 MED ORDER — DOXYCYCLINE HYCLATE 100 MG PO TABS
100.0000 mg | ORAL_TABLET | Freq: Two times a day (BID) | ORAL | 0 refills | Status: DC
Start: 2022-08-11 — End: 2022-08-11

## 2022-08-11 NOTE — Progress Notes (Signed)
Established patient visit   Patient: Wendy Anderson   DOB: 1971-01-25   52 y.o. Female  MRN: [id number] Visit Date: 08/11/2022  Today's healthcare provider: Charlton Amor, DO   Chief Complaint  Patient presents with   Sore Throat    Patient in office - c/o  ST , chillls, and swollen lymph nodes in throat   x Friday 08/08/22- states she just had hip replaced  3 weeks ago  and was using Celebrex feels this may have been masking her symptoms.     SUBJECTIVE    Chief Complaint  Patient presents with   Sore Throat    Patient in office - c/o  ST , chillls, and swollen lymph nodes in throat   x Friday 08/08/22- states she just had hip replaced  3 weeks ago  and was using Celebrex feels this may have been masking her symptoms.    Sore Throat  Pertinent negatives include no abdominal pain, coughing or shortness of breath.    Presents with sore throat for 3 days.  He is currently on Celebrex for hip replacement so she is unable to tell if she has body aches.  However, when the Celebrex wears off and she is due for her dose at bedtime she does note some bodyaches as well as some chills that have recently started to go as well.   Review of Systems  Constitutional:  Positive for chills and fever. Negative for activity change and fatigue.  Respiratory:  Negative for cough and shortness of breath.   Cardiovascular:  Negative for chest pain.  Gastrointestinal:  Negative for abdominal pain.  Genitourinary:  Negative for difficulty urinating.       Current Meds  Medication Sig   amoxicillin (AMOXIL) 875 MG tablet Take 1 tablet (875 mg total) by mouth 2 (two) times daily for 5 days.   aspirin EC 81 MG tablet Take 81 mg by mouth in the morning and at bedtime. Swallow whole.   Celecoxib (CELEBREX PO) Take by mouth.   Cholecalciferol (D3 5000) 125 MCG (5000 UT) capsule Take 5,000 Units by mouth daily.   CINNAMON PO Take 1,200 mg by mouth daily.   Coenzyme Q10 (CO Q 10 PO) Take 200 mg by  mouth daily.   [employer] TRIPLE MAGNESIUM COMPLEX PO Take by mouth. Glycinate 200 mg Malate 150 mg  Citrate 50 mg  Taurate 100 mg   DOTTI 0.025 MG/24HR Place 1 patch onto the skin 2 (two) times a week.   GARLIC PO Take 4,098 mg by mouth daily.   ibuprofen (ADVIL) 600 MG tablet Take 1 tablet (600 mg total) by mouth every 8 (eight) hours as needed.   MAGNESIUM PO Take 1 tablet by mouth daily. L-theonate 2207 mg   naproxen (NAPROSYN) 500 MG tablet Take 1 tablet (500 mg total) by mouth 2 (two) times daily with a meal.   NON FORMULARY Compounded testosterone ( 2 clicks)   Omega-3 Fatty Acids (FISH OIL) 1000 MG CAPS Take by mouth.   progesterone (PROMETRIUM) 100 MG capsule Take 100 mg by mouth daily.   TURMERIC PO Take by mouth daily.    valACYclovir (VALTREX) 1000 MG tablet Take 1,000 mg by mouth 3 (three) times daily.   [DISCONTINUED] doxycycline (VIBRA-TABS) 100 MG tablet Take 1 tablet (100 mg total) by mouth 2 (two) times daily for 7 days.    OBJECTIVE    BP (!) 133/90   Pulse 88   Temp 98.2 F (36.8 C)  Ht 5\' 3"  (1.6 m)   Wt 173 lb (78.5 kg)   SpO2 99%   BMI 30.65 kg/m   Physical Exam Vitals and nursing note reviewed.  Constitutional:      General: She is not in acute distress.    Appearance: Normal appearance.  HENT:     Head: Normocephalic and atraumatic.     Right Ear: External ear normal.     Left Ear: External ear normal.     Nose: Congestion present.     Mouth/Throat:     Pharynx: Posterior oropharyngeal erythema present. No oropharyngeal exudate.  Eyes:     Conjunctiva/sclera: Conjunctivae normal.  Cardiovascular:     Rate and Rhythm: Normal rate and regular rhythm.  Pulmonary:     Effort: Pulmonary effort is normal.     Breath sounds: Normal breath sounds.  Neurological:     General: No focal deficit present.     Mental Status: She is alert and oriented to person, place, and time.  Psychiatric:        Mood and Affect: Mood normal.        Behavior: Behavior  normal.        Thought Content: Thought content normal.        Judgment: Judgment normal.          ASSESSMENT & PLAN    Problem List Items Addressed This Visit       Respiratory   Sinobronchitis - Primary    Patient presents with congestion as well as chills and bodyaches.  She has also been around her sick grandson who has strep.  Will go ahead and treat with amoxicillin.  Is also going to her son's graduation in 2 days in [place]. - POC rapid strep and COVID were negative      Relevant Medications   amoxicillin (AMOXIL) 875 MG tablet   Other Visit Diagnoses     Pharyngitis, unspecified etiology       Relevant Orders   POC COVID-19 (Completed)   POCT rapid strep A (Completed)       No follow-ups on file.      Meds ordered this encounter  Medications   DISCONTD: doxycycline (VIBRA-TABS) 100 MG tablet    Sig: Take 1 tablet (100 mg total) by mouth 2 (two) times daily for 7 days.    Dispense:  14 tablet    Refill:  0   amoxicillin (AMOXIL) 875 MG tablet    Sig: Take 1 tablet (875 mg total) by mouth 2 (two) times daily for 5 days.    Dispense:  10 tablet    Refill:  0    Orders Placed This Encounter  Procedures   POC COVID-19    Order Specific Question:   Previously tested for COVID-19    Answer:   Yes    Order Specific Question:   Resident in a congregate (group) care setting    Answer:   No    Order Specific Question:   Employed in healthcare setting    Answer:   Unknown    Order Specific Question:   Pregnant    Answer:   No   POCT rapid strep A     Charlton Amor, DO  [facility] at [facility] [phone] (phone) [phone] (fax)  [facility]

## 2022-08-11 NOTE — Assessment & Plan Note (Addendum)
Patient presents with congestion as well as chills and bodyaches.  She has also been around her sick grandson who has strep.  Will go ahead and treat with amoxicillin.  Is also going to her son's graduation in 2 days in [place]. - POC rapid strep and COVID were negative

## 2022-08-16 DIAGNOSIS — G478 Other sleep disorders: Secondary | ICD-10-CM | POA: Diagnosis not present

## 2022-08-16 NOTE — Procedures (Signed)
    Patient Name: Wendy Anderson, Wendy Anderson Date: 08/08/2022 Gender: Female D.O.B: [birth year] Age (years): 52 Referring Provider: [name] Height (inches): 53 Interpreting Physician: Jetty Duhamel MD, ABSM Weight (lbs): 170 RPSGT: [place] BMI: 43 MRN: [id number] Neck Size: 14.00  CLINICAL INFORMATION Sleep Study Type: HST Indication for sleep study: nocturnal hypoxemia Epworth Sleepiness Score: 2  SLEEP STUDY TECHNIQUE A multi-channel overnight portable sleep study was performed. The channels recorded were: nasal airflow, thoracic respiratory movement, and oxygen saturation with a pulse oximetry. Snoring was also monitored.  MEDICATIONS Patient self administered medications include: none reported.  SLEEP ARCHITECTURE Patient was studied for 515.8 minutes. The sleep efficiency was 100.0 % and the patient was supine for 0%. The arousal index was 0.0 per hour.  RESPIRATORY PARAMETERS The overall AHI was 17.9 per hour, with a central apnea index of 0 per hour. The oxygen nadir was 88% during sleep.  CARDIAC DATA Mean heart rate during sleep was 101.7 bpm.  IMPRESSIONS - Moderate obstructive sleep apnea occurred during this study (AHI = 17.9/h). - Mild oxygen desaturation was noted during this study (Min O2 = 88%, Mean 95%). - Patient snored.  DIAGNOSIS - Obstructive Sleep Apnea (G47.33)  RECOMMENDATIONS - Suggest CPAP titration sleep study or autopap. Other options would be based on clinical judgment. - Avoid alcohol, sedatives and other CNS depressants that may worsen sleep apnea and disrupt normal sleep architecture. - Sleep hygiene should be reviewed to assess factors that may improve sleep quality. - Weight management and regular exercise should be initiated or continued.  [Electronically signed] 08/16/2022 11:37 AM  Jetty Duhamel MD, ABSM Diplomate, American Board of Sleep Medicine NPI: [id number]                         Jetty Duhamel Diplomate, American Board of Sleep Medicine  ELECTRONICALLY SIGNED ON:  08/16/2022, 11:34 AM [facility] PH: [phone]   FX: [phone] ACCREDITED BY THE AMERICAN ACADEMY OF SLEEP MEDICINE

## 2022-08-18 ENCOUNTER — Encounter: Payer: Self-pay | Admitting: Family Medicine

## 2022-08-19 ENCOUNTER — Other Ambulatory Visit: Payer: Self-pay | Admitting: Family Medicine

## 2022-08-19 DIAGNOSIS — J029 Acute pharyngitis, unspecified: Secondary | ICD-10-CM

## 2022-08-19 MED ORDER — AMOXICILLIN 500 MG PO CAPS
500.0000 mg | ORAL_CAPSULE | Freq: Two times a day (BID) | ORAL | 0 refills | Status: DC
Start: 2022-08-19 — End: 2022-08-29

## 2022-08-20 ENCOUNTER — Telehealth: Payer: Self-pay | Admitting: Family Medicine

## 2022-08-20 NOTE — Telephone Encounter (Signed)
Pls call pt ot schedule appt to go over sleep study results. Don't use an acute slot.

## 2022-08-29 ENCOUNTER — Encounter: Payer: Self-pay | Admitting: Physician Assistant

## 2022-08-29 ENCOUNTER — Ambulatory Visit: Payer: No Typology Code available for payment source | Admitting: Physician Assistant

## 2022-08-29 VITALS — BP 123/79 | HR 85 | Ht 63.0 in | Wt 173.0 lb

## 2022-08-29 DIAGNOSIS — G4733 Obstructive sleep apnea (adult) (pediatric): Secondary | ICD-10-CM

## 2022-08-29 NOTE — Progress Notes (Unsigned)
Established Patient Office Visit  Subjective   Patient ID: Wendy Anderson, female    DOB: 03-09-1971  Age: 52 y.o. MRN: [id number]  Chief Complaint  Patient presents with   Results    HPI  Pt is a 52 yo female who presents to the clinic to discuss sleep test.   IMPRESSIONS - Moderate obstructive sleep apnea occurred during this study (AHI = 17.9/h). - Mild oxygen desaturation was noted during this study (Min O2 = 88%, Mean 95%). - Patient snored.   DIAGNOSIS - Obstructive Sleep Apnea (G47.33)   RECOMMENDATIONS - Suggest CPAP titration sleep study or autopap. Other options would be based on clinical judgment. - Avoid alcohol, sedatives and other CNS depressants that may worsen sleep apnea and disrupt normal sleep architecture. - Sleep hygiene should be reviewed to assess factors that may improve sleep quality. - Weight management and regular exercise should be initiated or continued.   Pt's half brother found to have the BAP-1 genetic tumor suppression gene and wonders about testing.    Patient Active Problem List   Diagnosis Date Noted   OSA (obstructive sleep apnea) 08/29/2022   Sinobronchitis 08/11/2022   History of total left hip replacement 07/17/2022   Menopausal symptoms 07/08/2022   Chronic Epstein-Barr virus (EBV) infection syndrome 07/08/2022   Post-menopause 07/07/2022   Non-restorative sleep 10/21/2021   Tachycardia 10/08/2021   SOB (shortness of breath) 10/08/2021   Chronic cough 10/08/2021   Atrophy of pancreas 02/25/2021   Diverticulitis 02/22/2021   Left lower quadrant abdominal pain 02/22/2021   Primary osteoarthritis of left hip 09/25/2020   Left hip pain 01/30/2020   Myalgia 01/30/2020   Paresthesia 01/30/2020   Positive ANA (antinuclear antibody) 01/30/2020   Leg cramping 01/30/2020   Diverticulosis 10/24/2019   Hyperglyceridemia 09/13/2019   Class 1 obesity due to excess calories without serious comorbidity with body mass index (BMI) of 30.0  to 30.9 in adult 09/02/2019   Ganglion cyst of foot 09/20/2018   Onychomycosis 09/20/2018   Thyroid cyst 06/18/2017   Right hip pain 06/09/2017   Neck fullness 05/24/2017   Suspicious nevus 05/20/2017   Cervical herniated disc 12/02/2016   Sebaceous hyperplasia 04/28/2016   Stress at home 02/27/2016   Anxiety state 02/27/2016   Right shoulder pain 07/20/2015   Chest discomfort 03/28/2015   Essential hypertension 12/07/2014   Hyperlipidemia 11/11/2013   Vitamin D insufficiency 04/12/2013   Hemorrhoids 04/12/2013   Insomnia 04/12/2013   Degenerative disc disease, cervical 04/12/2013   Hematuria 04/12/2013   Status post right hip replacement 2012 [facility] 04/11/2013   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   Prediabetes 04/11/2013   Tendinitis of left rotator cuff 04/11/2013   Gluten intolerance 04/11/2013   Past Medical History:  Diagnosis Date   Migraine    Prediabetes    Past Surgical History:  Procedure Laterality Date   bladder tack     BONE EXOSTOSIS EXCISION     removal from left femur(benign)   removal of bone spurs     form 4th and 5th toes   TERATOMA EXCISION     bilateral   TOTAL HIP ARTHROPLASTY     right hip   TOTAL VAGINAL HYSTERECTOMY     UMBILICAL HERNIA REPAIR     Family History  Problem Relation Age of Onset   Lung cancer Mother        deceased at age 40   Neurologic Disorder Father        Primary Progressive Aphasia   Renal  cancer Brother    Brain cancer Maternal Grandmother    Heart attack Maternal Grandfather    Diabetes Paternal Grandfather    Hyperlipidemia Paternal Grandfather    Hypertension Paternal Grandfather    Breast cancer Cousin        3 cousins from mother side   Stroke Neg Hx    Colon cancer Neg Hx    Pancreatic cancer Neg Hx    Liver disease Neg Hx    Stomach cancer Neg Hx    Allergies  Allergen Reactions   Ivp Dye [Iodinated Contrast Media] Hives   Dolobid [Diflunisal]    Percocet [Oxycodone-Acetaminophen] Hives   Egg Shells      Eggs- systemic inflammation   Gluten Meal Other (See Comments)    Systemic inflammation   Milk Protein Other (See Comments)   Nickel Itching   Codeine Anxiety   Codeine Sulfate Anxiety      Review of Systems  All other systems reviewed and are negative.     Objective:     BP 123/79 (BP Location: Left Arm, Patient Position: Sitting, Cuff Size: Small)   Pulse 85   Ht 5\' 3"  (1.6 m)   Wt 173 lb (78.5 kg)   SpO2 99%   BMI 30.65 kg/m  BP Readings from Last 3 Encounters:  08/29/22 123/79  08/11/22 (!) 133/90  07/07/22 133/74   Wt Readings from Last 3 Encounters:  08/29/22 173 lb (78.5 kg)  08/11/22 173 lb (78.5 kg)  07/07/22 176 lb (79.8 kg)      Physical Exam Constitutional:      Appearance: Normal appearance. She is obese.  HENT:     Head: Normocephalic.  Cardiovascular:     Rate and Rhythm: Normal rate and regular rhythm.  Pulmonary:     Effort: Pulmonary effort is normal.     Breath sounds: Normal breath sounds.  Neurological:     General: No focal deficit present.     Mental Status: She is alert and oriented to person, place, and time.  Psychiatric:        Mood and Affect: Mood normal.       The 10-year ASCVD risk score (Arnett DK, et al., 2019) is: 2%    Assessment & Plan:  Marland KitchenMarland KitchenDackeri was seen today for results.  Diagnoses and all orders for this visit:  OSA (obstructive sleep apnea) -     For home use only DME continuous positive airway pressure (CPAP)   Autopap ordered for patient and faxed to DME company Follow up in within 90 days of CPAP usage  Discussed we do not do genetic testing and should do through free testing offered through oncology.     [name], PA-C

## 2022-08-29 NOTE — Patient Instructions (Addendum)
Contacted Imaging. Please contact them at [phone] to schedule mammogram.  Sleep Apnea Sleep apnea is a condition in which breathing pauses or becomes shallow during sleep. People with sleep apnea usually snore loudly. They may have times when they gasp and stop breathing for 10 seconds or more during sleep. This may happen many times during the night. Sleep apnea disrupts your sleep and keeps your body from getting the rest that it needs. This condition can increase your risk of certain health problems, including: Heart attack. Stroke. Obesity. Type 2 diabetes. Heart failure. Irregular heartbeat. High blood pressure. The goal of treatment is to help you breathe normally again. What are the causes?  The most common cause of sleep apnea is a collapsed or blocked airway. There are three kinds of sleep apnea: Obstructive sleep apnea. This kind is caused by a blocked or collapsed airway. Central sleep apnea. This kind happens when the part of the brain that controls breathing does not send the correct signals to the muscles that control breathing. Mixed sleep apnea. This is a combination of obstructive and central sleep apnea. What increases the risk? You are more likely to develop this condition if you: Are overweight. Smoke. Have a smaller than normal airway. Are older. Are female. Drink alcohol. Take sedatives or tranquilizers. Have a family history of sleep apnea. Have a tongue or tonsils that are larger than normal. What are the signs or symptoms? Symptoms of this condition include: Trouble staying asleep. Loud snoring. Morning headaches. Waking up gasping. Dry mouth or sore throat in the morning. Daytime sleepiness and tiredness. If you have daytime fatigue because of sleep apnea, you may be more likely to have: Trouble concentrating. Forgetfulness. Irritability or mood swings. Personality changes. Feelings of depression. Sexual dysfunction. This may include loss of  interest if you are female, or erectile dysfunction if you are female. How is this diagnosed? This condition may be diagnosed with: A medical history. A physical exam. A series of tests that are done while you are sleeping (sleep study). These tests are usually done in a sleep lab, but they may also be done at home. How is this treated? Treatment for this condition aims to restore normal breathing and to ease symptoms during sleep. It may involve managing health issues that can affect breathing, such as high blood pressure or obesity. Treatment may include: Sleeping on your side. Using a decongestant if you have nasal congestion. Avoiding the use of depressants, including alcohol, sedatives, and narcotics. Losing weight if you are overweight. Making changes to your diet. Quitting smoking. Using a device to open your airway while you sleep, such as: An oral appliance. This is a custom-made mouthpiece that shifts your lower jaw forward. A continuous positive airway pressure (CPAP) device. This device blows air through a mask when you breathe out (exhale). A nasal expiratory positive airway pressure (EPAP) device. This device has valves that you put into each nostril. A bi-level positive airway pressure (BIPAP) device. This device blows air through a mask when you breathe in (inhale) and breathe out (exhale). Having surgery if other treatments do not work. During surgery, excess tissue is removed to create a wider airway. Follow these instructions at home: Lifestyle Make any lifestyle changes that your health care provider recommends. Eat a healthy, well-balanced diet. Take steps to lose weight if you are overweight. Avoid using depressants, including alcohol, sedatives, and narcotics. Do not use any products that contain nicotine or tobacco. These products include cigarettes, chewing tobacco, and  vaping devices, such as e-cigarettes. If you need help quitting, ask your health care  provider. General instructions Take over-the-counter and prescription medicines only as told by your health care provider. If you were given a device to open your airway while you sleep, use it only as told by your health care provider. If you are having surgery, make sure to tell your health care provider you have sleep apnea. You may need to bring your device with you. Keep all follow-up visits. This is important. Contact a health care provider if: The device that you received to open your airway during sleep is uncomfortable or does not seem to be working. Your symptoms do not improve. Your symptoms get worse. Get help right away if: You develop: Chest pain. Shortness of breath. Discomfort in your back, arms, or stomach. You have: Trouble speaking. Weakness on one side of your body. Drooping in your face. These symptoms may represent a serious problem that is an emergency. Do not wait to see if the symptoms will go away. Get medical help right away. Call your local emergency services (911 in the [place] Do not drive yourself to the hospital. Summary Sleep apnea is a condition in which breathing pauses or becomes shallow during sleep. The most common cause is a collapsed or blocked airway. The goal of treatment is to restore normal breathing and to ease symptoms during sleep. This information is not intended to replace advice given to you by your health care provider. Make sure you discuss any questions you have with your health care provider. Document Revised: 10/17/2020 Document Reviewed: 02/17/2020 Elsevier Patient Education  2024 [employer].

## 2022-09-01 ENCOUNTER — Encounter: Payer: Self-pay | Admitting: Physician Assistant

## 2022-09-09 ENCOUNTER — Ambulatory Visit: Payer: No Typology Code available for payment source | Admitting: Sports Medicine

## 2022-09-09 DIAGNOSIS — S83207S Unspecified tear of unspecified meniscus, current injury, left knee, sequela: Secondary | ICD-10-CM

## 2022-09-09 DIAGNOSIS — S83207A Unspecified tear of unspecified meniscus, current injury, left knee, initial encounter: Secondary | ICD-10-CM | POA: Diagnosis not present

## 2022-09-09 NOTE — Progress Notes (Signed)
    Procedures performed today:    None.  Independent interpretation of notes and tests performed by another provider:   None.  Brief History, Exam, Impression, and Recommendations:    Acute meniscal tear of left knee Several years ago this pleasant 52 year old female slipped and fell, her left knee was forced into hyperflexion, she had immediate pain, swelling, bruising. Was seen by one of my partners, x-rays were done, negative for fracture. Unfortunately she has continued to have discomfort, mostly medial joint line worse with terminal flexion, she does get occasional episodes of locking. On exam she has tenderness medial joint line, she has a positive McMurray's sign. Due to locking and a positive McMurray's sign I do think we need to proceed with MRI, she can continue over-the-counter analgesics in the meantime, she will wear knee sleeve, we discussed potential outcomes and treatments. I do think she likely has either a meniscal tear, intra-articular loose body. MRI will need to be done at [facility] imaging for insurance purposes.    ____________________________________________ Ihor Austin. Benjamin Stain, M.D., ABFM., CAQSM., AME. Primary Care and Sports Medicine [facility]  Adjunct Professor of Family Medicine  [employer]  Restaurant manager, fast food

## 2022-09-09 NOTE — Assessment & Plan Note (Signed)
Several years ago this pleasant 52 year old female slipped and fell, her left knee was forced into hyperflexion, she had immediate pain, swelling, bruising. Was seen by one of my partners, x-rays were done, negative for fracture. Unfortunately she has continued to have discomfort, mostly medial joint line worse with terminal flexion, she does get occasional episodes of locking. On exam she has tenderness medial joint line, she has a positive McMurray's sign. Due to locking and a positive McMurray's sign I do think we need to proceed with MRI, she can continue over-the-counter analgesics in the meantime, she will wear knee sleeve, we discussed potential outcomes and treatments. I do think she likely has either a meniscal tear, intra-articular loose body. MRI will need to be done at [facility] imaging for insurance purposes.

## 2022-09-11 ENCOUNTER — Ambulatory Visit
Admission: RE | Admit: 2022-09-11 | Discharge: 2022-09-11 | Disposition: A | Discharge status: Home / Self Care | Payer: No Typology Code available for payment source | Source: Ambulatory Visit | Attending: Sports Medicine | Admitting: Sports Medicine

## 2022-09-11 DIAGNOSIS — S83207S Unspecified tear of unspecified meniscus, current injury, left knee, sequela: Secondary | ICD-10-CM

## 2022-09-17 NOTE — Telephone Encounter (Signed)
Wendy Anderson, can you check into this one

## 2022-09-17 NOTE — Telephone Encounter (Signed)
Pt aware.

## 2022-09-17 NOTE — Telephone Encounter (Signed)
Patient's husband came into office to ask about Cpap machine for patient, they would like to know the status on Cpap machine being that she hasn't heard anything, thanks.

## 2022-09-17 NOTE — Telephone Encounter (Signed)
Spoke with [employer]. They stated they were a little behind but they were able to pull the order and start processing today. Pt should hear from [employer] soon.

## 2022-09-18 ENCOUNTER — Ambulatory Visit: Payer: No Typology Code available for payment source

## 2022-09-18 DIAGNOSIS — Z1231 Encounter for screening mammogram for malignant neoplasm of breast: Secondary | ICD-10-CM

## 2022-09-22 NOTE — Progress Notes (Signed)
Normal mammogram. Follow up in 1 year.

## 2022-09-30 ENCOUNTER — Ambulatory Visit: Payer: No Typology Code available for payment source | Admitting: Family Medicine

## 2022-10-06 ENCOUNTER — Ambulatory Visit: Payer: No Typology Code available for payment source | Admitting: Physician Assistant

## 2022-10-06 ENCOUNTER — Encounter: Payer: Self-pay | Admitting: Physician Assistant

## 2022-10-06 VITALS — BP 125/83 | HR 78 | Ht 63.0 in | Wt 174.0 lb

## 2022-10-06 DIAGNOSIS — L814 Other melanin hyperpigmentation: Secondary | ICD-10-CM | POA: Insufficient documentation

## 2022-10-06 DIAGNOSIS — Z808 Family history of malignant neoplasm of other organs or systems: Secondary | ICD-10-CM

## 2022-10-06 DIAGNOSIS — L908 Other atrophic disorders of skin: Secondary | ICD-10-CM | POA: Diagnosis not present

## 2022-10-06 DIAGNOSIS — L819 Disorder of pigmentation, unspecified: Secondary | ICD-10-CM | POA: Insufficient documentation

## 2022-10-06 DIAGNOSIS — L738 Other specified follicular disorders: Secondary | ICD-10-CM

## 2022-10-06 MED ORDER — KETOCONAZOLE 2 % EX CREA: 1.0000 | TOPICAL_CREAM | Freq: Two times a day (BID) | CUTANEOUS | 1 refills | Status: DC

## 2022-10-06 NOTE — Progress Notes (Signed)
Acute Office Visit  Subjective:     Patient ID: Rikita Grabert, female    DOB: [birth year], 52 y.o.   MRN: [id number]  Chief Complaint  Patient presents with   Spots on skin    Spots on neck, leg, and face    HPI Patient is in today for concern about skin findings. Her bother has melanoma and she is worried about her skin findings. She has appt with dermatology but not until the end of August. She has had these places on her skin for a while and they have not changed but she is just worried about them.   .. 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   Prediabetes 04/11/2013   Tendinitis of left rotator cuff 04/11/2013   Gluten intolerance 04/11/2013   Vitamin D insufficiency 04/12/2013   Hemorrhoids 04/12/2013   Insomnia 04/12/2013   Degenerative disc disease, cervical 04/12/2013   Hematuria 04/12/2013   Hyperlipidemia 11/11/2013   Essential hypertension 12/07/2014   Chest discomfort 03/28/2015   Right shoulder pain 07/20/2015   Stress at home 02/27/2016   Anxiety state 02/27/2016   Sebaceous hyperplasia 04/28/2016   Cervical herniated disc 12/02/2016   Suspicious nevus 05/20/2017   Neck fullness 05/24/2017   Right hip pain 06/09/2017   Thyroid cyst 06/18/2017   Ganglion cyst of foot 09/20/2018   Onychomycosis 09/20/2018   Class 1 obesity due to excess calories without serious comorbidity with body mass index (BMI) of 30.0 to 30.9 in adult 09/02/2019   Hyperglyceridemia 09/13/2019   Diverticulosis 10/24/2019   Left hip pain 01/30/2020   Myalgia 01/30/2020   Paresthesia 01/30/2020   Positive ANA (antinuclear antibody) 01/30/2020   Leg cramping 01/30/2020   Primary osteoarthritis of left hip 09/25/2020   Diverticulitis 02/22/2021   Left lower quadrant abdominal pain 02/22/2021   Atrophy of pancreas 02/25/2021   Tachycardia 10/08/2021   SOB (shortness of breath)  10/08/2021   Chronic cough 10/08/2021   Non-restorative sleep 10/21/2021   Post-menopause 07/07/2022   Menopausal symptoms 07/08/2022   Chronic Epstein-Barr virus (EBV) infection syndrome 07/08/2022   Sinobronchitis 08/11/2022   History of total left hip replacement 07/17/2022   OSA (obstructive sleep apnea) 08/29/2022   Acute meniscal tear of left knee 09/09/2022   Resolved Ambulatory Problems    Diagnosis Date Noted   Viral upper respiratory tract infection 02/15/2020   Past Medical History:  Diagnosis Date   Migraine      ROS See HPI.      Objective:    BP 125/83 (BP Location: Left Arm, Patient Position: Sitting, Cuff Size: Normal)   Pulse 78   Ht 5\' 3"  (1.6 m)   Wt 174 lb (78.9 kg)   SpO2 99%   BMI 30.82 kg/m  BP Readings from Last 3 Encounters:  10/06/22 125/83  08/29/22 123/79  08/11/22 (!) 133/90   Wt Readings from Last 3 Encounters:  10/06/22 174 lb (78.9 kg)  08/29/22 173 lb (78.5 kg)  08/11/22 173 lb (78.5 kg)      Physical Exam Hyperpigmented flat macule of upper left cheek and upper right forehead with irregular borders and no scales.   Scattered raised flesh colored papules of forehead with central indention.  Scattered hypopigmented macules of bilateral forearms.        Assessment & Plan:  Marland KitchenMarland KitchenDackeri was seen today for spots on skin.  Diagnoses and all orders for  this visit:  Skin aging  Family history of melanoma  Solar lentigo  Hyperpigmentation of skin -     ketoconazole (NIZORAL) 2 % cream; Apply 1 Application topically 2 (two) times daily. To affected areas.  Sebaceous hyperplasia   Discussed benign conditions and gave HO. Encouraged her to keep dermatology appt.  No hx of cryotherapy or reason for hypopigmentation spots ? Fungus Use ketoconazole bid for the next few weeks and see if improves symptoms  [name], PA-C

## 2022-10-17 ENCOUNTER — Other Ambulatory Visit: Payer: Self-pay | Admitting: Physician Assistant

## 2022-10-17 DIAGNOSIS — R053 Chronic cough: Secondary | ICD-10-CM

## 2022-10-17 DIAGNOSIS — R0602 Shortness of breath: Secondary | ICD-10-CM

## 2022-12-15 ENCOUNTER — Ambulatory Visit: Payer: No Typology Code available for payment source | Admitting: Physician Assistant

## 2022-12-22 ENCOUNTER — Encounter: Payer: Self-pay | Admitting: Physician Assistant

## 2022-12-22 ENCOUNTER — Ambulatory Visit: Payer: No Typology Code available for payment source

## 2022-12-22 ENCOUNTER — Ambulatory Visit: Payer: No Typology Code available for payment source | Admitting: Physician Assistant

## 2022-12-22 VITALS — BP 138/82 | HR 71 | Ht 63.0 in | Wt 177.0 lb

## 2022-12-22 DIAGNOSIS — G4734 Idiopathic sleep related nonobstructive alveolar hypoventilation: Secondary | ICD-10-CM

## 2022-12-22 DIAGNOSIS — E66811 Obesity, class 1: Secondary | ICD-10-CM

## 2022-12-22 DIAGNOSIS — G4733 Obstructive sleep apnea (adult) (pediatric): Secondary | ICD-10-CM

## 2022-12-22 DIAGNOSIS — E6609 Other obesity due to excess calories: Secondary | ICD-10-CM | POA: Diagnosis not present

## 2022-12-22 DIAGNOSIS — G8929 Other chronic pain: Secondary | ICD-10-CM

## 2022-12-22 DIAGNOSIS — M25512 Pain in left shoulder: Secondary | ICD-10-CM

## 2022-12-22 DIAGNOSIS — Z6831 Body mass index (BMI) 31.0-31.9, adult: Secondary | ICD-10-CM

## 2022-12-22 DIAGNOSIS — Z79890 Hormone replacement therapy (postmenopausal): Secondary | ICD-10-CM

## 2022-12-22 MED ORDER — AMBULATORY NON FORMULARY MEDICATION
Status: AC
Start: 2022-12-22 — End: ?

## 2022-12-22 MED ORDER — DICLOFENAC SODIUM 1 % EX GEL: 4.0000 g | Freq: Four times a day (QID) | CUTANEOUS | 1 refills | Status: DC

## 2022-12-22 NOTE — Progress Notes (Signed)
Established Patient Office Visit  Subjective   Patient ID: Wendy Anderson, female    DOB: [birth year]  Age: 52 y.o. MRN: [id number]  No chief complaint on file.   HPI  Patient is a 52 year old female in today for follow-up on OSA, CPAP use, and chronic left shoulder pain.   Patient did home sleep study on 08/08/22. Showed moderate OSA and was recommended to begin CPAP. Patient has been using CPAP since 10/2022. Reports that she is using CPAP nightly and for the entire night at least 7 hours. She states that she hasn't noticed any benefit from using CPAP. Patient does state that her apple watch is still showing that her oxygen is dropping to 82-85% at night time. She states her machine is not frequently giving her readings that her settings are not adequate. Patient is frustrated and would like to discuss options for improvement today.  Patient also states that she has had chronic left shoulder pain for the past 4-5 years. She states that she was seen by Baldemar Lenis and was diagnosed with calcific tendonitis. They gave her a left shoulder injection and she reported symptom relief for a couple years. States that her pain has increased over the past several months. Patient points to her pain being over her left [facility] joint with occasional radiation "deeper" into her left shoulder, Complains of pain and limited ROM with the left shoulder. Patient does report to stretching it at home for pain relief.   She is very frustrated with her weight gain. She is exercising and eating healthy but still gaining weight. She does not like the weight loss medication idea. She is on HRT and using her CPAP but no weight loss.   .. 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   Prediabetes 04/11/2013   Tendinitis of left rotator cuff 04/11/2013   Gluten intolerance 04/11/2013   Vitamin D insufficiency 04/12/2013    Hemorrhoids 04/12/2013   Insomnia 04/12/2013   Degenerative disc disease, cervical 04/12/2013   Hematuria 04/12/2013   Hyperlipidemia 11/11/2013   Essential hypertension 12/07/2014   Chest discomfort 03/28/2015   Right shoulder pain 07/20/2015   Stress at home 02/27/2016   Anxiety state 02/27/2016   Sebaceous hyperplasia 04/28/2016   Cervical herniated disc 12/02/2016   Suspicious nevus 05/20/2017   Neck fullness 05/24/2017   Right hip pain 06/09/2017   Thyroid cyst 06/18/2017   Ganglion cyst of foot 09/20/2018   Onychomycosis 09/20/2018   Class 1 obesity due to excess calories without serious comorbidity with body mass index (BMI) of 30.0 to 30.9 in adult 09/02/2019   Hyperglyceridemia 09/13/2019   Diverticulosis 10/24/2019   Left hip pain 01/30/2020   Myalgia 01/30/2020   Paresthesia 01/30/2020   Positive ANA (antinuclear antibody) 01/30/2020   Leg cramping 01/30/2020   Primary osteoarthritis of left hip 09/25/2020   Diverticulitis 02/22/2021   Left lower quadrant abdominal pain 02/22/2021   Atrophy of pancreas 02/25/2021   Tachycardia 10/08/2021   SOB (shortness of breath) 10/08/2021   Chronic cough 10/08/2021   Non-restorative sleep 10/21/2021   Post-menopause 07/07/2022   Menopausal symptoms 07/08/2022   Chronic Epstein-Barr virus (EBV) infection syndrome 07/08/2022   Sinobronchitis 08/11/2022   History of total left hip replacement 07/17/2022   OSA (obstructive sleep apnea) 08/29/2022   Acute meniscal tear of left knee 09/09/2022   Hyperpigmentation of skin 10/06/2022   Solar  lentigo 10/06/2022   Skin aging 10/06/2022   Resolved Ambulatory Problems    Diagnosis Date Noted   Viral upper respiratory tract infection 02/15/2020   Past Medical History:  Diagnosis Date   Migraine      ROS    Objective:     BP 138/82   Pulse 71   Ht 5\' 3"  (1.6 m)   Wt 80.3 kg   SpO2 97%   BMI 31.35 kg/m  BP Readings from Last 3 Encounters:  12/22/22 138/82   10/06/22 125/83  08/29/22 123/79   Wt Readings from Last 3 Encounters:  12/22/22 177 lb (80.3 kg)  10/06/22 174 lb (78.9 kg)  08/29/22 173 lb (78.5 kg)      Physical Exam Constitutional:      Appearance: Normal appearance. She is obese.  HENT:     Head: Normocephalic.  Cardiovascular:     Rate and Rhythm: Normal rate.  Pulmonary:     Effort: Pulmonary effort is normal.     Breath sounds: Normal breath sounds.  Musculoskeletal:     Right lower leg: No edema.     Left lower leg: No edema.  Neurological:     General: No focal deficit present.     Mental Status: She is alert.  Psychiatric:        Mood and Affect: Mood normal.        The 10-year ASCVD risk score (Arnett DK, et al., 2019) is: 2.5%    Assessment & Plan:  Marland KitchenMarland KitchenDiagnoses and all orders for this visit:  OSA on CPAP -     Ambulatory referral to Sleep Studies  Chronic left shoulder pain -     DG Shoulder Left; Future -     diclofenac Sodium (VOLTAREN) 1 % GEL; Apply 4 g topically 4 (four) times daily. To affected joint.  Nocturnal hypoxia -     Ambulatory referral to Sleep Studies  Class 1 obesity due to excess calories without serious comorbidity with body mass index (BMI) of 31.0 to 31.9 in adult  Hormone replacement therapy (HRT) -     AMBULATORY NON FORMULARY MEDICATION; Testosterone cream 2% .5ml once a day.    ? If we should send for CPAP titration study opted to  Send referral to sleep medicine Gave HO on exercises for chronic left shoulder pain Sent patient for XR of left shoulder today to rule out any bony pathology prior to injection  Will schedule for left shoulder US guided injection with Dr. Karie Schwalbe  ..Discussed low carb diet with 1500 calories and 80g of protein.  Exercising at least 150 minutes a week.  [employer] could be a Chief Technology Officer.  Does not want to try medications Consider Biome a probiotic to help with weight loss in menopause   Return if symptoms worsen or fail to  improve.

## 2022-12-22 NOTE — Patient Instructions (Addendum)
Biome   Shoulder Impingement Syndrome Rehab Ask your health care provider which exercises are safe for you. Do exercises exactly as told by your provider and adjust them as told. It is normal to feel mild stretching, pulling, tightness, or discomfort as you do these exercises. Stop right away if you feel sudden pain or your pain gets worse. Do not begin these exercises until told by your provider. Stretching and range-of-motion exercise This exercise warms up your muscles and joints and improves the movement and flexibility of your shoulder. This exercise also helps to relieve pain and stiffness. Passive horizontal adduction In passive adduction, you use your other hand to move the injured arm toward your body. The injured arm does not move on its own. In this movement, your arm is moved across your body in the horizontal plane (horizontal adduction). Sit or stand and pull your left / right elbow across your chest, toward your other shoulder. Stop when you feel a gentle stretch in the back of your shoulder and upper arm. Keep your arm at shoulder height. Keep your arm as close to your body as you comfortably can. Hold for __________ seconds. Slowly return to the starting position. Repeat __________ times. Complete this exercise __________ times a day. Strengthening exercises These exercises build strength and endurance in your shoulder. Endurance is the ability to use your muscles for a long time, even after they get tired. External rotation, isometric This is an exercise in which you press the back of your wrist against a doorframe without moving your shoulder joint (isometric). Stand or sit in a doorway, facing the door frame. Bend your left / right elbow and place the back of your wrist against the doorframe. Only the back of your wrist should be touching the frame. Keep your upper arm at your side. Gently press your wrist against the doorframe, as if you are trying to push your arm away from  your abdomen (external rotation). Press as hard as you are able without pain. Avoid shrugging your shoulder while you press your wrist against the doorframe. Keep your shoulder blade tucked down toward the middle of your back. Hold for __________ seconds. Slowly release the tension, and relax your muscles completely before you repeat the exercise. Repeat __________ times. Complete this exercise __________ times a day. Internal rotation, isometric This is an exercise in which you press your palm against a doorframe without moving your shoulder joint (isometric). Stand or sit in a doorway, facing the doorframe. Bend your left / right elbow and place the palm of your hand against the doorframe. Only your palm should be touching the frame. Keep your upper arm at your side. Gently press your hand against the doorframe, as if you are trying to push your arm toward your abdomen (internal rotation). Press as hard as you are able without pain. Avoid shrugging your shoulder while you press your hand against the doorframe. Keep your shoulder blade tucked down toward the middle of your back. Hold for __________ seconds. Slowly release the tension, and relax your muscles completely before you repeat the exercise. Repeat __________ times. Complete this exercise __________ times a day. Scapular protraction, supine, isotonic  Lie on your back on a firm surface (supine position). Hold a __________ weight in your left / right hand. Raise your left / right arm straight into the air so your hand is directly above your shoulder joint. Push the weight into the air so your shoulder (scapula) lifts off the surface that you  are lying on. The scapula will push up or forward (protraction). Do not move your head, neck, or back. Hold for __________ seconds. Slowly return to the starting position. Let your muscles relax completely before you repeat this exercise. Repeat __________ times. Complete this exercise __________  times a day. Scapular retraction, isotonic  Sit in a stable chair without armrests or stand up. Secure an exercise band to a stable object in front of you so the band is at shoulder height. Hold one end of the exercise band in each hand. Squeeze your shoulder blades together (retraction) and move your elbows slightly behind you. Do not shrug your shoulders upward while you do this. Hold for __________ seconds. Slowly return to the starting position. Repeat __________ times. Complete this exercise __________ times a day. Shoulder extension, isotonic  Sit in a stable chair without armrests or stand up. Secure an exercise band to a stable object in front of you so the band is above shoulder height. Hold one end of the exercise band in each hand. Straighten your elbows and lift your hands up to shoulder height. Squeeze your shoulder blades together and pull your hands down to the sides of your thighs (extension). Stop when your hands are straight down by your sides. Do not let your hands go behind your body. Hold for __________ seconds. Slowly return to the starting position. Repeat __________ times. Complete this exercise __________ times a day. This information is not intended to replace advice given to you by your health care provider. Make sure you discuss any questions you have with your health care provider. Document Revised: 12/05/2021 Document Reviewed: 12/05/2021 Elsevier Patient Education  2024 [employer].

## 2022-12-29 ENCOUNTER — Ambulatory Visit: Payer: No Typology Code available for payment source | Admitting: Sports Medicine

## 2023-01-09 ENCOUNTER — Ambulatory Visit: Payer: No Typology Code available for payment source | Admitting: Sports Medicine

## 2023-01-12 ENCOUNTER — Encounter: Payer: Self-pay | Admitting: Physician Assistant

## 2023-01-12 DIAGNOSIS — M19012 Primary osteoarthritis, left shoulder: Secondary | ICD-10-CM | POA: Insufficient documentation

## 2023-01-12 NOTE — Progress Notes (Signed)
DeDe,   AC joint arthritis. Get in with Dr. Karie Schwalbe for further injection planning.

## 2023-01-13 ENCOUNTER — Telehealth: Payer: Self-pay | Admitting: Physician Assistant

## 2023-01-13 DIAGNOSIS — E66811 Obesity, class 1: Secondary | ICD-10-CM | POA: Insufficient documentation

## 2023-01-13 DIAGNOSIS — G4733 Obstructive sleep apnea (adult) (pediatric): Secondary | ICD-10-CM

## 2023-01-13 DIAGNOSIS — E6609 Other obesity due to excess calories: Secondary | ICD-10-CM | POA: Insufficient documentation

## 2023-01-13 DIAGNOSIS — G4734 Idiopathic sleep related nonobstructive alveolar hypoventilation: Secondary | ICD-10-CM | POA: Insufficient documentation

## 2023-01-13 NOTE — Telephone Encounter (Signed)
Referral, clinical notes, demographics and copies of insurance cards have been faxed to [facility] [facility] at [phone]. Office will contact patient to schedule referral appointment.

## 2023-01-21 ENCOUNTER — Ambulatory Visit: Payer: No Typology Code available for payment source | Admitting: Sports Medicine

## 2023-01-29 ENCOUNTER — Ambulatory Visit: Payer: No Typology Code available for payment source | Admitting: Sports Medicine

## 2023-02-05 ENCOUNTER — Encounter: Payer: Self-pay | Admitting: Physician Assistant

## 2023-02-06 MED ORDER — LOSARTAN POTASSIUM 25 MG PO TABS: 25.0000 mg | ORAL_TABLET | Freq: Every day | ORAL | 0 refills | Status: DC

## 2023-04-01 ENCOUNTER — Ambulatory Visit (INDEPENDENT_AMBULATORY_CARE_PROVIDER_SITE_OTHER): Payer: No Typology Code available for payment source | Admitting: Family Medicine

## 2023-04-01 ENCOUNTER — Ambulatory Visit: Payer: Self-pay | Admitting: Physician Assistant

## 2023-04-01 ENCOUNTER — Encounter: Payer: Self-pay | Admitting: Family Medicine

## 2023-04-01 VITALS — BP 152/92 | HR 77 | Ht 63.0 in | Wt 183.0 lb

## 2023-04-01 DIAGNOSIS — R030 Elevated blood-pressure reading, without diagnosis of hypertension: Secondary | ICD-10-CM | POA: Diagnosis not present

## 2023-04-01 DIAGNOSIS — S99921A Unspecified injury of right foot, initial encounter: Secondary | ICD-10-CM | POA: Insufficient documentation

## 2023-04-01 NOTE — Progress Notes (Signed)
   Acute Office Visit  Subjective:     Patient ID: Wendy Anderson, female    DOB: 05/23/1970, 53 y.o.   MRN: [id number]  Chief Complaint  Patient presents with   Foot Pain    HPI Patient is in today for acute visit of R foot pain for 1 1/2 weeks. She dropped her phone on her foot and has had pain since.   Review of Systems  Constitutional:  Negative for chills and fever.  Respiratory:  Negative for cough and shortness of breath.   Cardiovascular:  Negative for chest pain.  Musculoskeletal:        Foot pain  Neurological:  Negative for headaches.        Objective:    BP (!) 152/92   Pulse 77   Ht 5' 3 (1.6 m)   Wt 183 lb (83 kg)   SpO2 98%   BMI 32.42 kg/m    Physical Exam Vitals and nursing note reviewed.  Constitutional:      General: She is not in acute distress.    Appearance: Normal appearance.  HENT:     Head: Normocephalic and atraumatic.     Right Ear: External ear normal.     Left Ear: External ear normal.     Nose: Nose normal.  Eyes:     Conjunctiva/sclera: Conjunctivae normal.  Cardiovascular:     Rate and Rhythm: Normal rate.  Pulmonary:     Effort: Pulmonary effort is normal.  Musculoskeletal:     Comments: Pain located on lateral aspect of right foot distal to ankle. Some ecchymosis appreciated. Tenderness to palpation. Good ROM. No pain with extension/flexion/inversion/eversion  Neurological:     General: No focal deficit present.     Mental Status: She is alert and oriented to person, place, and time.  Psychiatric:        Mood and Affect: Mood normal.        Behavior: Behavior normal.        Thought Content: Thought content normal.        Judgment: Judgment normal.     No results found for Wendy visits on 04/01/23.      Assessment & Plan:   Problem List Items Addressed This Visit       Other   Injury of right foot - Primary   Pt had injury where she dropped her phone from about 3 feet height and injured her R foot. Will go  ahead and obtain xray since she is having pain on lateral aspect of right foot  - recommend rest, ice, elevation, tylenol      Relevant Orders   DG Foot Complete Right   Elevated blood pressure reading   Elevated blood pressure likely related to pain today - will continue to monitor       No orders of the defined types were placed in this encounter.   Return if symptoms worsen or fail to improve.  Bernice GORMAN [place], DO

## 2023-04-01 NOTE — Assessment & Plan Note (Addendum)
 Pt had injury where she dropped her phone from about 3 feet height and injured her R foot. Will go ahead and obtain xray since she is having pain on lateral aspect of right foot  - recommend rest, ice, elevation, tylenol

## 2023-04-01 NOTE — Telephone Encounter (Signed)
 Chief Complaint: R foot pain Symptoms: pain with pressure Frequency: 1.5 weeks Pertinent Negatives: Patient denies swelling, bruising, numbness or tingling Disposition: [] ED /[] Urgent Care (no appt availability in office) / [x] Appointment(In office/virtual)/ []  [place] [facility]/ [] Home Care/ [] Refused Recommended Disposition /[] [place] [facility]/ []  Follow-up with PCP Additional Notes: Patient called stating approx 1.5 weeks ago she dropped her phone on her R foot and is having continued pain. Pt states that when she puts her shoe on the pressure increases the pain. Pt denies numbness, tingling or bruising to area. She states that there is a small pea sized knot where the phone hit her foot. Pt requested to see specific provider, this RN attempted to call [facility] twice, no answer. Per protocol, pt to be evaluated within 3 days. Patient is requesting an appt today. PCP has two available slots, pt unable to make those times and requests to see another provider. Scheduled for today at 1600 per pt request. Care advice reviewed, pt verbalized understanding.   Copied from CRM [id number]. Topic: Clinical - Red Word Triage >> Apr 01, 2023  8:07 AM Susanna ORN wrote: Red Word that prompted transfer to Nurse Triage: Patient thinks she may have broken a bone in right foot and states it's swollen. Wanting to see Dr. ONEIDA. Reason for Disposition  [1] MODERATE pain (e.g., interferes with normal activities, limping) AND [2] present > 3 days  [1] After 3 days AND [2] pain not improved  Answer Assessment - Initial Assessment Questions 1. ONSET: When did the pain start?      1.5 weeks 2. LOCATION: Where is the pain located?      By ankle Right foot- states there is a knot in that area. 3. PAIN: How bad is the pain?    (Scale 1-10; or mild, moderate, severe)  - MILD (1-3): doesn't interfere with normal activities.   - MODERATE (4-7): interferes with normal activities (e.g., work or school) or awakens  from sleep, limping.   - SEVERE (8-10): excruciating pain, unable to do any normal activities, unable to walk.      1/10 with rest, 3/10 with walking, 7/10 with pressure 4. WORK OR EXERCISE: Has there been any recent work or exercise that involved this part of the body?      Yes, but after dropping the phone 5. CAUSE: What do you think is causing the foot pain?     Dropped phone on her foot 6. OTHER SYMPTOMS: Do you have any other symptoms? (e.g., leg pain, rash, fever, numbness)     Pea sized knot on foot  Answer Assessment - Initial Assessment Questions 1. MECHANISM: How did the injury happen? (e.g., twisting injury, direct blow)      Dropped phone on right fppt 2. ONSET: When did the injury happen? (Minutes or hours ago)      1.5 weeks ago 3. LOCATION: Where is the injury located?      R foot, near ankle 4. APPEARANCE of INJURY: What does the injury look like?      Pea sized knot on foot 5. WEIGHT-BEARING: Can you put weight on that foot? Can you walk (four steps or more)?       Can bare weight, pain with putting on shoe 6. SIZE: For cuts, bruises, or swelling, ask: How large is it? (e.g., inches or centimeters;  entire joint)       Pea size knot on foot 7. PAIN: Is there pain? If Yes, ask: How bad is the  pain?    (e.g., Scale 1-10; or mild, moderate, severe)   - NONE (0): no pain.   - MILD (1-3): doesn't interfere with normal activities.    - MODERATE (4-7): interferes with normal activities (e.g., work or school) or awakens from sleep, limping.    - SEVERE (8-10): excruciating pain, unable to do any normal activities, unable to walk.       2/10 8. TETANUS: For any breaks in the skin, ask: When was the last tetanus booster?     Unsure 9. OTHER SYMPTOMS: Do you have any other symptoms?      Denies  Protocols used: Foot Pain-A-AH, Ankle and Foot Injury-A-AH

## 2023-04-01 NOTE — Assessment & Plan Note (Addendum)
 Elevated blood pressure likely related to pain today - will continue to monitor

## 2023-04-02 ENCOUNTER — Ambulatory Visit (INDEPENDENT_AMBULATORY_CARE_PROVIDER_SITE_OTHER): Payer: No Typology Code available for payment source

## 2023-04-02 DIAGNOSIS — S9781XA Crushing injury of right foot, initial encounter: Secondary | ICD-10-CM | POA: Diagnosis not present

## 2023-04-02 DIAGNOSIS — S99921A Unspecified injury of right foot, initial encounter: Secondary | ICD-10-CM

## 2023-07-29 ENCOUNTER — Encounter: Payer: Self-pay | Admitting: Physician Assistant

## 2023-11-24 ENCOUNTER — Encounter: Payer: Self-pay | Admitting: Sports Medicine

## 2024-01-25 ENCOUNTER — Encounter: Payer: Self-pay | Admitting: Physician Assistant

## 2024-01-25 DIAGNOSIS — N951 Menopausal and female climacteric states: Secondary | ICD-10-CM

## 2024-01-26 MED ORDER — DOTTI 0.025 MG/24HR TD PTTW: 1.0000 | MEDICATED_PATCH | TRANSDERMAL | 0 refills | Status: AC

## 2024-01-26 MED ORDER — PROGESTERONE 200 MG PO CAPS: 200.0000 mg | ORAL_CAPSULE | Freq: Every day | ORAL | 0 refills | Status: AC

## 2024-02-10 ENCOUNTER — Encounter: Payer: Self-pay | Admitting: Physician Assistant

## 2024-02-10 ENCOUNTER — Ambulatory Visit (INDEPENDENT_AMBULATORY_CARE_PROVIDER_SITE_OTHER): Admitting: Physician Assistant

## 2024-02-10 VITALS — BP 123/78 | HR 75 | Ht 63.0 in | Wt 183.0 lb

## 2024-02-10 DIAGNOSIS — K9049 Malabsorption due to intolerance, not elsewhere classified: Secondary | ICD-10-CM

## 2024-02-10 DIAGNOSIS — Z0000 Encounter for general adult medical examination without abnormal findings: Secondary | ICD-10-CM | POA: Diagnosis not present

## 2024-02-10 DIAGNOSIS — E6609 Other obesity due to excess calories: Secondary | ICD-10-CM

## 2024-02-10 DIAGNOSIS — E66811 Obesity, class 1: Secondary | ICD-10-CM

## 2024-02-10 DIAGNOSIS — Z7184 Encounter for health counseling related to travel: Secondary | ICD-10-CM

## 2024-02-10 DIAGNOSIS — R140 Abdominal distension (gaseous): Secondary | ICD-10-CM | POA: Diagnosis not present

## 2024-02-10 DIAGNOSIS — Z8719 Personal history of other diseases of the digestive system: Secondary | ICD-10-CM

## 2024-02-10 DIAGNOSIS — R5382 Chronic fatigue, unspecified: Secondary | ICD-10-CM

## 2024-02-10 DIAGNOSIS — Z6832 Body mass index (BMI) 32.0-32.9, adult: Secondary | ICD-10-CM

## 2024-02-10 DIAGNOSIS — K9041 Non-celiac gluten sensitivity: Secondary | ICD-10-CM

## 2024-02-10 DIAGNOSIS — R1032 Left lower quadrant pain: Secondary | ICD-10-CM

## 2024-02-10 MED ORDER — ONDANSETRON 8 MG PO TBDP: 8.0000 mg | ORAL_TABLET | Freq: Three times a day (TID) | ORAL | 1 refills | Status: AC | PRN

## 2024-02-10 MED ORDER — AZITHROMYCIN 250 MG PO TABS: ORAL_TABLET | ORAL | 0 refills | Status: AC

## 2024-02-10 NOTE — Patient Instructions (Signed)
 Will place referral for GI.   Health Maintenance, Female Adopting a healthy lifestyle and getting preventive care are important in promoting health and wellness. Ask your health care provider about: The right schedule for you to have regular tests and exams. Things you can do on your own to prevent diseases and keep yourself healthy. What should I know about diet, weight, and exercise? Eat a healthy diet  Eat a diet that includes plenty of vegetables, fruits, low-fat dairy products, and lean protein. Do not eat a lot of foods that are high in solid fats, added sugars, or sodium. Maintain a healthy weight Body mass index (BMI) is used to identify weight problems. It estimates body fat based on height and weight. Your health care provider can help determine your BMI and help you achieve or maintain a healthy weight. Get regular exercise Get regular exercise. This is one of the most important things you can do for your health. Most adults should: Exercise for at least 150 minutes each week. The exercise should increase your heart rate and make you sweat (moderate-intensity exercise). Do strengthening exercises at least twice a week. This is in addition to the moderate-intensity exercise. Spend less time sitting. Even light physical activity can be beneficial. Watch cholesterol and blood lipids Have your blood tested for lipids and cholesterol at 55 years of age, then have this test every 5 years. Have your cholesterol levels checked more often if: Your lipid or cholesterol levels are high. You are older than 55 years of age. You are at high risk for heart disease. What should I know about cancer screening? Depending on your health history and family history, you may need to have cancer screening at various ages. This may include screening for: Breast cancer. Cervical cancer. Colorectal cancer. Skin cancer. Lung cancer. What should I know about heart disease, diabetes, and high blood  pressure? Blood pressure and heart disease High blood pressure causes heart disease and increases the risk of stroke. This is more likely to develop in people who have high blood pressure readings or are overweight. Have your blood pressure checked: Every 3-5 years if you are 55-29 years of age. Every year if you are 55 years old or older. Diabetes Have regular diabetes screenings. This checks your fasting blood sugar level. Have the screening done: Once every three years after age 55 if you are at a normal weight and have a low risk for diabetes. More often and at a younger age if you are overweight or have a high risk for diabetes. What should I know about preventing infection? Hepatitis B If you have a higher risk for hepatitis B, you should be screened for this virus. Talk with your health care provider to find out if you are at risk for hepatitis B infection. Hepatitis C Testing is recommended for: Everyone born from [birth year] through 1967. Anyone with known risk factors for hepatitis C. Sexually transmitted infections (STIs) Get screened for STIs, including gonorrhea and chlamydia, if: You are sexually active and are younger than 55 years of age. You are older than 55 years of age and your health care provider tells you that you are at risk for this type of infection. Your sexual activity has changed since you were last screened, and you are at increased risk for chlamydia or gonorrhea. Ask your health care provider if you are at risk. Ask your health care provider about whether you are at high risk for HIV. Your health care provider may recommend  a prescription medicine to help prevent HIV infection. If you choose to take medicine to prevent HIV, you should first get tested for HIV. You should then be tested every 3 months for as long as you are taking the medicine. Pregnancy If you are about to stop having your period (premenopausal) and you may become pregnant, seek counseling before you  get pregnant. Take 400 to 800 micrograms (mcg) of folic acid every day if you become pregnant. Ask for birth control (contraception) if you want to prevent pregnancy. Osteoporosis and menopause Osteoporosis is a disease in which the bones lose minerals and strength with aging. This can result in bone fractures. If you are 55 years old or older, or if you are at risk for osteoporosis and fractures, ask your health care provider if you should: Be screened for bone loss. Take a calcium  or vitamin D  supplement to lower your risk of fractures. Be given hormone replacement therapy (HRT) to treat symptoms of menopause. Follow these instructions at home: Alcohol use Do not drink alcohol if: Your health care provider tells you not to drink. You are pregnant, may be pregnant, or are planning to become pregnant. If you drink alcohol: Limit how much you have to: 0-1 drink a day. Know how much alcohol is in your drink. In the [place] one drink equals one 12 oz bottle of beer (355 mL), one 5 oz glass of wine (148 mL), or one 1 oz glass of hard liquor (44 mL). Lifestyle Do not use any products that contain nicotine or tobacco. These products include cigarettes, chewing tobacco, and vaping devices, such as e-cigarettes. If you need help quitting, ask your health care provider. Do not use street drugs. Do not share needles. Ask your health care provider for help if you need support or information about quitting drugs. General instructions Schedule regular health, dental, and eye exams. Stay current with your vaccines. Tell your health care provider if: You often feel depressed. You have ever been abused or do not feel safe at home. Summary Adopting a healthy lifestyle and getting preventive care are important in promoting health and wellness. Follow your health care provider's instructions about healthy diet, exercising, and getting tested or screened for diseases. Follow your health care provider's  instructions on monitoring your cholesterol and blood pressure. This information is not intended to replace advice given to you by your health care provider. Make sure you discuss any questions you have with your health care provider. Document Revised: 07/30/2020 Document Reviewed: 07/30/2020 Elsevier Patient Education  2024 [employer].

## 2024-02-12 ENCOUNTER — Encounter: Payer: Self-pay | Admitting: Physician Assistant

## 2024-02-12 DIAGNOSIS — K9049 Malabsorption due to intolerance, not elsewhere classified: Secondary | ICD-10-CM | POA: Insufficient documentation

## 2024-02-12 DIAGNOSIS — R140 Abdominal distension (gaseous): Secondary | ICD-10-CM | POA: Insufficient documentation

## 2024-02-12 DIAGNOSIS — R1032 Left lower quadrant pain: Secondary | ICD-10-CM | POA: Insufficient documentation

## 2024-02-12 DIAGNOSIS — R5382 Chronic fatigue, unspecified: Secondary | ICD-10-CM | POA: Insufficient documentation

## 2024-02-12 NOTE — Progress Notes (Signed)
 Complete physical exam  Patient: Wendy Anderson   DOB: 1971-02-12   53 y.o. Female  MRN: [id number]  Subjective:    Chief Complaint  Patient presents with   Annual Exam   Discussed the use of AI scribe software for clinical note transcription with the patient, who gave verbal consent to proceed.  History of Present Illness [name] is a 53 year old female who presents for annual exam.   Weight management - Participating in a virtual medical weight loss program with consultations from a dietitian and nurse practitioner for the past month - Current weight is 179.4 pounds, decreased from 183 pounds in January - Weight loss of approximately 1 pound over the past month - Utilizes walking, dietary changes, and fasting for weight loss, but has not achieved significant results - Set weight loss goals with the dietitian  Abdominal symptoms and gastrointestinal function - History of diverticulitis, with first episode occurring a few years ago - Persistent dull abdominal sensation described as a 'little hum' - Frequent abdominal bloating, with relief only during fasting - Emergency room visit in September for abdominal symptoms - Last colonoscopy performed a few years ago did not provide a clear visual - Regular bowel movements maintained by consuming raw vegetables, fruits, and adequate water intake - Avoids dairy due to casein sensitivity, which exacerbates bloating and discomfort  Medication and supplement use - Currently taking Wellbutrin, started one month ago, with mild improvement in mental well-being - On hormone replacement therapy - Takes various vitamins and a probiotic - Avoids metformin due to concerns about gastrointestinal side effects, specifically bloating and discomfort  Travel Upcoming mission trip to [place] - needs zofran , anti-diarrhea  Request EBV levels to be checked     Most recent fall risk assessment:    02/10/2024    1:56 PM  Fall Risk    Falls in the past year? 0  Number falls in past yr: 0  Injury with Fall? 0     Most recent depression screenings:    02/10/2024    1:56 PM 10/06/2022    9:51 AM  PHQ 2/9 Scores  PHQ - 2 Score 1 0  PHQ- 9 Score 1     Vision:Within last year and Dental: No current dental problems and Receives regular dental care  Patient Active Problem List   Diagnosis Date Noted   Left lower quadrant pain 02/12/2024   Bloating 02/12/2024   Chronic fatigue 02/12/2024   History of diverticulitis 02/10/2024   Injury of right foot 04/01/2023   Elevated blood pressure reading 04/01/2023   Class 1 obesity due to excess calories without serious comorbidity with body mass index (BMI) of 32.0 to 32.9 in adult 01/13/2023   Nocturnal hypoxia 01/13/2023   Arthritis of left shoulder region 01/12/2023   Hyperpigmentation of skin 10/06/2022   Solar lentigo 10/06/2022   Skin aging 10/06/2022   Acute meniscal tear of left knee 09/09/2022   OSA on CPAP 08/29/2022   Sinobronchitis 08/11/2022   History of total left hip replacement 07/17/2022   Menopausal symptoms 07/08/2022   Chronic Epstein-Barr virus (EBV) infection syndrome 07/08/2022   Post-menopause 07/07/2022   Non-restorative sleep 10/21/2021   Tachycardia 10/08/2021   SOB (shortness of breath) 10/08/2021   Chronic cough 10/08/2021   Atrophy of pancreas 02/25/2021   Diverticulitis 02/22/2021   Left lower quadrant abdominal pain 02/22/2021   Primary osteoarthritis of left hip 09/25/2020   Left hip pain 01/30/2020   Myalgia 01/30/2020  Paresthesia 01/30/2020   Positive ANA (antinuclear antibody) 01/30/2020   Leg cramping 01/30/2020   Diverticulosis 10/24/2019   Hyperglyceridemia 09/13/2019   Class 1 obesity due to excess calories without serious comorbidity with body mass index (BMI) of 30.0 to 30.9 in adult 09/02/2019   Ganglion cyst of foot 09/20/2018   Onychomycosis 09/20/2018   Thyroid  cyst 06/18/2017   Right hip pain 06/09/2017    Neck fullness 05/24/2017   Suspicious nevus 05/20/2017   Cervical herniated disc 12/02/2016   Sebaceous hyperplasia 04/28/2016   Stress at home 02/27/2016   Anxiety state 02/27/2016   Right shoulder pain 07/20/2015   Chest discomfort 03/28/2015   Essential hypertension 12/07/2014   Hyperlipidemia 11/11/2013   Vitamin D  insufficiency 04/12/2013   Hemorrhoids 04/12/2013   Insomnia 04/12/2013   Degenerative disc disease, cervical 04/12/2013   Hematuria 04/12/2013   Status post right hip replacement 2012 [facility] 04/11/2013   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   Prediabetes 04/11/2013   Tendinitis of left rotator cuff 04/11/2013   Gluten intolerance 04/11/2013   Past Medical History:  Diagnosis Date   Migraine    Prediabetes    Past Surgical History:  Procedure Laterality Date   bladder tack     BONE EXOSTOSIS EXCISION     removal from left femur(benign)   removal of bone spurs     form 4th and 5th toes   TERATOMA EXCISION     bilateral   TOTAL HIP ARTHROPLASTY     right hip   TOTAL VAGINAL HYSTERECTOMY     UMBILICAL HERNIA REPAIR     Social History   Tobacco Use   Smoking status: Never   Smokeless tobacco: Never  Vaping Use   Vaping status: Never Used  Substance Use Topics   Alcohol use: Yes    Comment: socially   Drug use: No   Family History  Problem Relation Age of Onset   Lung cancer Mother        deceased at age 31   Neurologic Disorder Father        Primary Progressive Aphasia   Renal cancer Brother    Brain cancer Maternal Grandmother    Heart attack Maternal Grandfather    Diabetes Paternal Grandfather    Hyperlipidemia Paternal Grandfather    Hypertension Paternal Grandfather    Breast cancer Cousin        3 cousins from mother side   Stroke Neg Hx    Colon cancer Neg Hx    Pancreatic cancer Neg Hx    Liver disease Neg Hx    Stomach cancer Neg Hx    Allergies  Allergen Reactions   Ivp Dye [Iodinated Contrast Media] Hives   Dolobid [Diflunisal]     Percocet [Oxycodone-Acetaminophen] Hives   Egg Shells     Eggs- systemic inflammation   Gluten Meal Other (See Comments)    Systemic inflammation   Milk Protein Other (See Comments)   Nickel Itching   Codeine Anxiety   Codeine Sulfate Anxiety      Patient Care Team: Eevee Borbon L, PA-C as PCP - General (Family Medicine)   Outpatient Medications Prior to Visit  Medication Sig   buPROPion (WELLBUTRIN XL) 150 MG 24 hr tablet Take 150 mg by mouth daily.   AMBULATORY NON FORMULARY MEDICATION Testosterone cream 2% .5ml once a day.   Cholecalciferol (D3 5000) 125 MCG (5000 UT) capsule Take 5,000 Units by mouth daily.   CINNAMON PO Take 1,200 mg by mouth daily.  Coenzyme Q10 (CO Q 10 PO) Take 200 mg by mouth daily.   [employer] TRIPLE MAGNESIUM COMPLEX PO Take by mouth. Glycinate 200 mg Malate 150 mg  Citrate 50 mg  Taurate 100 mg   DOTTI  0.025 MG/24HR Place 1 patch onto the skin 2 (two) times a week.   MAGNESIUM PO Take 1 tablet by mouth daily. L-theonate 2207 mg   NON FORMULARY Compounded testosterone ( 2 clicks)   Omega-3 Fatty Acids (FISH OIL) 1000 MG CAPS Take by mouth.   progesterone  (PROMETRIUM ) 200 MG capsule Take 1 capsule (200 mg total) by mouth daily.   No facility-administered medications prior to visit.    ROS  See HPI.       Objective:     BP 123/78   Pulse 75   Ht 5' 3 (1.6 m)   Wt 183 lb (83 kg)   SpO2 99%   BMI 32.42 kg/m  BP Readings from Last 3 Encounters:  02/10/24 123/78  04/01/23 (!) 152/92  12/22/22 138/82   Wt Readings from Last 3 Encounters:  02/10/24 183 lb (83 kg)  04/01/23 183 lb (83 kg)  12/22/22 177 lb (80.3 kg)      Physical Exam  BP 123/78   Pulse 75   Ht 5' 3 (1.6 m)   Wt 183 lb (83 kg)   SpO2 99%   BMI 32.42 kg/m   General Appearance:    Alert, cooperative, obese no distress, appears stated age  Head:    Normocephalic, without obvious abnormality, atraumatic  Eyes:    PERRL, conjunctiva/corneas clear, EOM's intact,  fundi    benign, both eyes  Ears:    Normal TM's and external ear canals, both ears  Nose:   Nares normal, septum midline, mucosa normal, no drainage    or sinus tenderness  Throat:   Lips, mucosa, and tongue normal; teeth and gums normal  Neck:   Supple, symmetrical, trachea midline, no adenopathy;    thyroid :  no enlargement/tenderness/nodules; no carotid   bruit or JVD  Back:     Symmetric, no curvature, ROM normal, no CVA tenderness  Lungs:     Clear to auscultation bilaterally, respirations unlabored  Chest Wall:    No tenderness or deformity   Heart:    Regular rate and rhythm, S1 and S2 normal, no murmur, rub   or gallop     Abdomen:     Distended and firm,  bowel sounds active all four quadrants,    no masses, no organomegaly        Extremities:   Extremities normal, atraumatic, no cyanosis or edema  Pulses:   2+ and symmetric all extremities  Skin:   Skin color, texture, turgor normal, no rashes or lesions  Lymph nodes:   Cervical, supraclavicular, and axillary nodes normal  Neurologic:   CNII-XII intact, normal strength, sensation and reflexes    throughout      Assessment & Plan:    Routine Health Maintenance and Physical Exam  Immunization History  Administered Date(s) Administered   Influenza,inj,Quad PF,6+ Mos 02/13/2012, 03/12/2015, 03/20/2017, 02/22/2021   Influenza-Unspecified 12/07/2017   Janssen (J&J) SARS-COV-2 Vaccination 11/21/2019   PPD Test 10/28/2017   Tdap 03/24/2009, 02/22/2021   Varicella 03/24/2009    Health Maintenance  Topic Date Due   COVID-19 Vaccine (2 - Janssen risk series) 02/26/2024 (Originally 12/19/2019)   Zoster Vaccines- Shingrix (1 of 2) 05/12/2024 (Originally 05/04/1987)   Influenza Vaccine  06/21/2024 (Originally 10/23/2023)   Pneumococcal Vaccine: 50+  Years (1 of 1 - PCV) 02/09/2025 (Originally 05/03/2018)   Hepatitis B Vaccines 19-59 Average Risk (1 of 3 - 19+ 3-dose series) 02/09/2025 (Originally 05/04/1987)   Mammogram   09/17/2024   Colonoscopy  10/12/2024   DTaP/Tdap/Td (3 - Td or Tdap) 02/23/2031   Hepatitis C Screening  Completed   HIV Screening  Completed   HPV VACCINES  Aged Out   Meningococcal B Vaccine  Aged Out    Discussed health benefits of physical activity, and encouraged her to engage in regular exercise appropriate for her age and condition.  Wendy Anderson was seen today for annual exam.  Diagnoses and all orders for this visit:  Routine physical examination  History of diverticulitis -     Ambulatory referral to Gastroenterology  Class 1 obesity due to excess calories without serious comorbidity with body mass index (BMI) of 32.0 to 32.9 in adult  Chronic fatigue -     EBV ab to viral capsid ag pnl, IgG+IgM  Counseling about travel -     ondansetron  (ZOFRAN -ODT) 8 MG disintegrating tablet; Take 1 tablet (8 mg total) by mouth every 8 (eight) hours as needed. -     azithromycin  (ZITHROMAX  Z-PAK) 250 MG tablet; Take 2 tablets (500 mg) on  Day 1,  followed by 1 tablet (250 mg) once daily on Days 2 through 5.  Bloating -     Ambulatory referral to Gastroenterology  Left lower quadrant pain -     Ambulatory referral to Gastroenterology  Gluten intolerance -     Ambulatory referral to Gastroenterology   Assessment & Plan Adult Wellness Visit Routine wellness visit focused on weight management and overall health. - FASTING labs ordered today.  - Continue current weight management program with dietitian and nurse practitioner. - Encouraged regular physical activity, such as walking a couple of miles a day. - colonoscopy UTD - discussed mammogram and opted to go every 2 years for screening since low risk - Declined all vaccines  Obesity with insulin resistance and weight management challenges Obesity with insulin resistance and difficulty losing weight despite lifestyle modifications. Currently on Wellbutrin for weight management and mood improvement. Discussed potential use  of metformin and GLP-1 agonists, with concerns about gastrointestinal side effects. Insurance coverage for GLP-1 agonists is uncertain. Metformin may help maintain weight loss and improve insulin resistance. GLP-1 agonists could be beneficial if affordable and tolerated. - Continue Wellbutrin for weight management and mood improvement. - Consider metformin if gastrointestinal side effects are manageable. - Evaluate insurance coverage for GLP-1 agonists. - Discuss potential use of GLP-1 agonists with gastroenterologist.  Recurrent diverticulitis with gastrointestinal symptoms Recurrent diverticulitis with bloating and gastrointestinal discomfort. Previous colonoscopy was inconclusive. Symptoms may be exacerbated by certain foods, particularly casein. No current tenderness or acute symptoms. Discussed potential impact of GLP-1 agonists on gastrointestinal symptoms. - Referred to gastroenterologist for further evaluation and management. - Discuss potential impact of GLP-1 agonists on gastrointestinal symptoms with gastroenterologist.  Depressed mood Managed with Wellbutrin, which also aids in weight management. Reports some improvement in mood and motivation. - Continue Wellbutrin for depression management.  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 Symptoms include bloating and gastrointestinal discomfort. Avoidance of casein has been partially effective. - Continue avoidance of casein-containing foods. - Consider alternative dairy products such as almond milk and coconut yogurt.  Menopausal hormone therapy Continued use of hormone replacement therapy (HRT) for menopausal symptoms. - Continue current hormone replacement therapy regimen. - Managed by GYN.   Upcoming  Travel - zpak for travelers diarrhea and zofran  for any nausea.      Return in about 1 year (around 02/09/2025).     Theo Krumholz, PA-C

## 2024-02-24 ENCOUNTER — Ambulatory Visit: Payer: Self-pay | Admitting: Physician Assistant

## 2024-02-24 LAB — EBV AB TO VIRAL CAPSID AG PNL, IGG+IGM
EBV VCA IgG: 528 U/mL — ABNORMAL HIGH (ref 0.0–17.9)
EBV VCA IgM: <36 U/mL (ref 0.0–35.9)

## 2024-02-24 NOTE — Progress Notes (Signed)
 No immediate exposure to Mono.  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

## 2024-03-28 ENCOUNTER — Encounter: Payer: Self-pay | Admitting: Physician Assistant
